# Patient Record
Sex: Male | Born: 1935 | Race: White | Hispanic: No | Marital: Married | Smoking: Former smoker
Health system: Southern US, Community
[De-identification: ages and names within clinical notes are randomized; demographics above are authoritative.]

## PROBLEM LIST (undated history)

## (undated) DIAGNOSIS — M549 Dorsalgia, unspecified: Secondary | ICD-10-CM

## (undated) DIAGNOSIS — I453 Trifascicular block: Secondary | ICD-10-CM

## (undated) DIAGNOSIS — R55 Syncope and collapse: Secondary | ICD-10-CM

## (undated) DIAGNOSIS — I10 Essential (primary) hypertension: Secondary | ICD-10-CM

## (undated) DIAGNOSIS — I493 Ventricular premature depolarization: Secondary | ICD-10-CM

## (undated) DIAGNOSIS — D472 Monoclonal gammopathy: Principal | ICD-10-CM

## (undated) DIAGNOSIS — G43909 Migraine, unspecified, not intractable, without status migrainosus: Secondary | ICD-10-CM

## (undated) DIAGNOSIS — K219 Gastro-esophageal reflux disease without esophagitis: Secondary | ICD-10-CM

## (undated) DIAGNOSIS — I4891 Unspecified atrial fibrillation: Secondary | ICD-10-CM

## (undated) DIAGNOSIS — R001 Bradycardia, unspecified: Secondary | ICD-10-CM

## (undated) DIAGNOSIS — I5022 Chronic systolic (congestive) heart failure: Secondary | ICD-10-CM

## (undated) DIAGNOSIS — J939 Pneumothorax, unspecified: Secondary | ICD-10-CM

## (undated) DIAGNOSIS — J449 Chronic obstructive pulmonary disease, unspecified: Secondary | ICD-10-CM

## (undated) DIAGNOSIS — I509 Heart failure, unspecified: Secondary | ICD-10-CM

## (undated) HISTORY — PX: TENDON REPAIR: SHX5111

## (undated) HISTORY — PX: SHOULDER ARTHROSCOPY: SHX128

## (undated) HISTORY — DX: Chronic systolic (congestive) heart failure: I50.22

## (undated) HISTORY — PX: CLAVICLE SURGERY: SHX598

## (undated) HISTORY — PX: PROSTATE SURGERY: SHX751

## (undated) HISTORY — DX: Ventricular premature depolarization: I49.3

## (undated) HISTORY — DX: Bradycardia, unspecified: R00.1

## (undated) HISTORY — DX: Chronic obstructive pulmonary disease, unspecified: J44.9

## (undated) HISTORY — DX: Monoclonal gammopathy: D47.2

## (undated) HISTORY — DX: Migraine, unspecified, not intractable, without status migrainosus: G43.909

---

## 1998-11-05 ENCOUNTER — Ambulatory Visit (HOSPITAL_BASED_OUTPATIENT_CLINIC_OR_DEPARTMENT_OTHER): Admission: RE | Admit: 1998-11-05 | Discharge: 1998-11-05 | Payer: Self-pay | Admitting: Otolaryngology

## 2000-09-26 ENCOUNTER — Ambulatory Visit (HOSPITAL_COMMUNITY): Admission: RE | Admit: 2000-09-26 | Discharge: 2000-09-26 | Payer: Self-pay | Admitting: Orthopedic Surgery

## 2000-09-27 ENCOUNTER — Emergency Department (HOSPITAL_COMMUNITY): Admission: EM | Admit: 2000-09-27 | Discharge: 2000-09-27 | Payer: Self-pay | Admitting: Emergency Medicine

## 2002-04-03 ENCOUNTER — Encounter: Admission: RE | Admit: 2002-04-03 | Discharge: 2002-04-03 | Payer: Self-pay | Admitting: Gastroenterology

## 2002-04-03 ENCOUNTER — Encounter: Payer: Self-pay | Admitting: Gastroenterology

## 2002-06-04 ENCOUNTER — Ambulatory Visit (HOSPITAL_COMMUNITY): Admission: RE | Admit: 2002-06-04 | Discharge: 2002-06-04 | Payer: Self-pay | Admitting: Gastroenterology

## 2002-06-04 ENCOUNTER — Encounter (INDEPENDENT_AMBULATORY_CARE_PROVIDER_SITE_OTHER): Payer: Self-pay | Admitting: Specialist

## 2002-06-12 ENCOUNTER — Ambulatory Visit (HOSPITAL_COMMUNITY): Admission: RE | Admit: 2002-06-12 | Discharge: 2002-06-12 | Payer: Self-pay | Admitting: Gastroenterology

## 2002-06-12 ENCOUNTER — Encounter: Payer: Self-pay | Admitting: Gastroenterology

## 2004-10-26 ENCOUNTER — Encounter: Admission: RE | Admit: 2004-10-26 | Discharge: 2004-10-26 | Payer: Self-pay | Admitting: Gastroenterology

## 2004-11-24 ENCOUNTER — Ambulatory Visit (HOSPITAL_COMMUNITY): Admission: RE | Admit: 2004-11-24 | Discharge: 2004-11-24 | Payer: Self-pay | Admitting: Gastroenterology

## 2004-11-24 ENCOUNTER — Encounter (INDEPENDENT_AMBULATORY_CARE_PROVIDER_SITE_OTHER): Payer: Self-pay | Admitting: *Deleted

## 2005-12-19 ENCOUNTER — Encounter: Payer: Self-pay | Admitting: Internal Medicine

## 2007-02-05 ENCOUNTER — Encounter: Admission: RE | Admit: 2007-02-05 | Discharge: 2007-02-05 | Payer: Self-pay | Admitting: Orthopedic Surgery

## 2007-02-07 ENCOUNTER — Ambulatory Visit (HOSPITAL_BASED_OUTPATIENT_CLINIC_OR_DEPARTMENT_OTHER): Admission: RE | Admit: 2007-02-07 | Discharge: 2007-02-07 | Payer: Self-pay | Admitting: Orthopedic Surgery

## 2009-03-22 ENCOUNTER — Ambulatory Visit: Payer: Self-pay | Admitting: Internal Medicine

## 2009-03-22 DIAGNOSIS — R0602 Shortness of breath: Secondary | ICD-10-CM

## 2009-04-29 ENCOUNTER — Ambulatory Visit: Payer: Self-pay | Admitting: Internal Medicine

## 2009-05-04 ENCOUNTER — Ambulatory Visit: Payer: Self-pay | Admitting: Internal Medicine

## 2009-05-04 DIAGNOSIS — J432 Centrilobular emphysema: Secondary | ICD-10-CM | POA: Insufficient documentation

## 2009-09-01 ENCOUNTER — Ambulatory Visit: Payer: Self-pay | Admitting: Internal Medicine

## 2009-09-01 ENCOUNTER — Encounter: Payer: Self-pay | Admitting: Adult Health

## 2009-09-01 DIAGNOSIS — I498 Other specified cardiac arrhythmias: Secondary | ICD-10-CM

## 2009-09-01 DIAGNOSIS — J069 Acute upper respiratory infection, unspecified: Secondary | ICD-10-CM | POA: Insufficient documentation

## 2009-09-03 ENCOUNTER — Ambulatory Visit (HOSPITAL_COMMUNITY): Admission: RE | Admit: 2009-09-03 | Discharge: 2009-09-03 | Payer: Self-pay | Admitting: Cardiology

## 2009-09-03 ENCOUNTER — Telehealth: Payer: Self-pay | Admitting: Adult Health

## 2009-09-20 ENCOUNTER — Ambulatory Visit: Payer: Self-pay | Admitting: Internal Medicine

## 2009-09-20 DIAGNOSIS — I5022 Chronic systolic (congestive) heart failure: Secondary | ICD-10-CM

## 2009-09-20 DIAGNOSIS — I493 Ventricular premature depolarization: Secondary | ICD-10-CM

## 2009-09-29 ENCOUNTER — Encounter: Payer: Self-pay | Admitting: Internal Medicine

## 2009-10-04 ENCOUNTER — Ambulatory Visit: Payer: Self-pay | Admitting: Internal Medicine

## 2009-10-04 ENCOUNTER — Encounter: Payer: Self-pay | Admitting: Internal Medicine

## 2009-10-29 ENCOUNTER — Ambulatory Visit: Payer: Self-pay | Admitting: Internal Medicine

## 2009-12-13 ENCOUNTER — Encounter: Payer: Self-pay | Admitting: Internal Medicine

## 2009-12-16 ENCOUNTER — Ambulatory Visit: Payer: Self-pay | Admitting: Internal Medicine

## 2010-02-18 ENCOUNTER — Ambulatory Visit (HOSPITAL_COMMUNITY): Admission: RE | Admit: 2010-02-18 | Discharge: 2010-02-18 | Payer: Self-pay | Admitting: Sports Medicine

## 2010-03-29 ENCOUNTER — Encounter: Payer: Self-pay | Admitting: Internal Medicine

## 2010-04-18 ENCOUNTER — Encounter (INDEPENDENT_AMBULATORY_CARE_PROVIDER_SITE_OTHER): Payer: Self-pay | Admitting: Orthopedic Surgery

## 2010-04-18 ENCOUNTER — Ambulatory Visit: Payer: Self-pay | Admitting: Surgery

## 2010-04-18 ENCOUNTER — Ambulatory Visit (HOSPITAL_COMMUNITY): Admission: RE | Admit: 2010-04-18 | Discharge: 2010-04-18 | Payer: Self-pay | Admitting: Orthopedic Surgery

## 2010-04-20 ENCOUNTER — Ambulatory Visit: Payer: Self-pay | Admitting: Internal Medicine

## 2010-04-20 ENCOUNTER — Encounter (INDEPENDENT_AMBULATORY_CARE_PROVIDER_SITE_OTHER): Payer: Self-pay | Admitting: *Deleted

## 2010-04-21 ENCOUNTER — Telehealth: Payer: Self-pay | Admitting: Nurse Practitioner

## 2010-04-22 ENCOUNTER — Telehealth: Payer: Self-pay | Admitting: Internal Medicine

## 2010-05-04 ENCOUNTER — Ambulatory Visit: Payer: Self-pay | Admitting: Internal Medicine

## 2010-05-04 LAB — CONVERTED CEMR LAB
BUN: 29 mg/dL — ABNORMAL HIGH (ref 6–23)
CO2: 30 meq/L (ref 19–32)
Chloride: 99 meq/L (ref 96–112)
Creatinine, Ser: 0.8 mg/dL (ref 0.4–1.5)
Eosinophils Absolute: 0.2 10*3/uL (ref 0.0–0.7)
Eosinophils Relative: 4.2 % (ref 0.0–5.0)
Glucose, Bld: 87 mg/dL (ref 70–99)
HCT: 40.6 % (ref 39.0–52.0)
Lymphs Abs: 1.3 10*3/uL (ref 0.7–4.0)
MCHC: 34.1 g/dL (ref 30.0–36.0)
MCV: 88.6 fL (ref 78.0–100.0)
Monocytes Absolute: 0.7 10*3/uL (ref 0.1–1.0)
Platelets: 256 10*3/uL (ref 150.0–400.0)
Potassium: 4.6 meq/L (ref 3.5–5.1)
Prothrombin Time: 10.3 s (ref 9.1–11.7)
RDW: 13.9 % (ref 11.5–14.6)
WBC: 5.6 10*3/uL (ref 4.5–10.5)

## 2010-05-05 ENCOUNTER — Telehealth: Payer: Self-pay | Admitting: Internal Medicine

## 2010-05-09 ENCOUNTER — Ambulatory Visit: Payer: Self-pay | Admitting: Internal Medicine

## 2010-05-09 ENCOUNTER — Ambulatory Visit (HOSPITAL_COMMUNITY): Admission: RE | Admit: 2010-05-09 | Discharge: 2010-05-10 | Payer: Self-pay | Admitting: Internal Medicine

## 2010-06-03 ENCOUNTER — Ambulatory Visit: Payer: Self-pay | Admitting: Internal Medicine

## 2010-08-15 ENCOUNTER — Encounter: Payer: Self-pay | Admitting: Internal Medicine

## 2011-01-03 NOTE — Letter (Signed)
Summary: [facility]   Imported By: Roderic Ovens 04/18/2010 14:43:24  _____________________________________________________________________  External Attachment:    Type:   Image     Comment:   External Document

## 2011-01-03 NOTE — Assessment & Plan Note (Signed)
Summary: 6wk f/u   Visit Type:  Follow-up Primary Provider:  Dr. Lanell Persons   History of Present Illness: Brian Hoover returns today for followup.  He is a pleasant 77 yo man with a h/o DCM, frequent PVC's and ETOH use.  He has sinus bradycardia.  With discontinuation of his ETOH intake, and uptitration of medical therapy, his CHF symptoms are improved and a recent 2-D echo demonstrated an increase in the LV function to 45%.  The patient still has minimally symptomatic PVC's.  He had 25K PVC's by 24 hour holter back in 09/2009.  This was prior to beta blocker therapy.  He has had no syncope.  He has been bothered by acute bronchitis and has not exercised.  He denies c/p or peripheral edema.  Current Medications (verified): 1)  Flomax 0.4 Mg Xr24h-Cap (Tamsulosin Hcl) .... Take 1 Capsule By Mouth Once A Day 2)  Avodart 0.5 Mg Caps (Dutasteride) .... Take 1 Capsule By Mouth Once A Day 3)  Bayer Low Strength 81 Mg Tbec (Aspirin) .... Take 1 Tablet By Mouth Once A Day 4)  Multivitamins   Tabs (Multiple Vitamin) .... Take 1 Tablet By Mouth Once A Day 5)  Trimethoprim 100 Mg Tabs (Trimethoprim) .... Once Daily 6)  Proair Hfa 108 (90 Base) Mcg/act Aers (Albuterol Sulfate) .... Inhale 2 Puffs Every Four Hours As Needed 7)  Carvedilol 6.25 Mg Tabs (Carvedilol) .... Take One Tablet By Mouth Twice A Day 8)  Lisinopril 20 Mg Tabs (Lisinopril) .... Take One Tablet By Mouth Daily  Allergies: 1)  Sulfamethoxazole  Past History:  Past Medical History: Last updated: 10/27/2009 Current Problems:  CHRONIC SYSTOLIC HEART FAILURE (ICD-428.22) PREMATURE VENTRICULAR CONTRACTIONS, FREQUENT (ICD-427.69) BRADYCARDIA (ICD-427.89) UPPER RESPIRATORY INFECTION, ACUTE (ICD-465.9) COPD (ICD-496) DYSPNEA (ICD-786.05)    Past Surgical History: Last updated: 03/22/2009 right foot, sore tendon repair right shoulder repair surgery for BPH  Review of Systems  The patient denies chest pain, syncope, dyspnea  on exertion, and peripheral edema.    Vital Signs:  Patient profile:   77 year old male Height:      70 inches Weight:      171 pounds BMI:     24.62 Pulse rate:   52 / minute BP sitting:   118 / 64  (left arm)  Vitals Entered By: Laurance Flatten CMA (December 16, 2009 2:23 PM)  Physical Exam  General:  Well developed, well nourished, in no acute distress. Head:  normocephalic and atraumatic Eyes:  PERRLA/EOM intact; conjunctiva and lids normal. Mouth:  Teeth, gums and palate normal. Oral mucosa normal. Neck:  Neck supple, no JVD. No masses, thyromegaly or abnormal cervical nodes. Lungs:  Clear bilaterally to auscultation.  No wheezes, rales, or rhonchi. Heart:  Regular brady with frequent premature beats.  No murmurs.  PMI is enlarged and laterally displaced. Abdomen:  Bowel sounds positive; abdomen soft and non-tender without masses, organomegaly, or hernias noted. No hepatosplenomegaly. Msk:  Back normal, normal gait. Muscle strength and tone normal. Pulses:  pulses normal in all 4 extremities Extremities:  No clubbing or cyanosis. Neurologic:  Alert and oriented x 3.   EKG  Procedure date:  12/16/2009  Findings:      Sinus bradycardia with rate of:  52.  Impression & Recommendations:  Problem # 1:  CHRONIC SYSTOLIC HEART FAILURE (ICD-428.22) His symptoms are current class 1-2.  I have asked him to continue his current meds and maintain a low sodium diet.  Also, I have notified him  that it is ok to start exercising again by walking. His updated medication list for this problem includes:    Bayer Low Strength 81 Mg Tbec (Aspirin) .Marland Kitchen... Take 1 tablet by mouth once a day    Carvedilol 6.25 Mg Tabs (Carvedilol) .Marland Kitchen... Take one tablet by mouth twice a day    Lisinopril 20 Mg Tabs (Lisinopril) .Marland Kitchen... Take one tablet by mouth daily  Problem # 2:  PREMATURE VENTRICULAR CONTRACTIONS, FREQUENT (ICD-427.69) His PVC's are not too symptomatic unless he feels for his pulse.  I will not  uptitrate his beta blocker further at this point deferring this to Dr. Anne Fu. His updated medication list for this problem includes:    Bayer Low Strength 81 Mg Tbec (Aspirin) .Marland Kitchen... Take 1 tablet by mouth once a day    Carvedilol 6.25 Mg Tabs (Carvedilol) .Marland Kitchen... Take one tablet by mouth twice a day    Lisinopril 20 Mg Tabs (Lisinopril) .Marland Kitchen... Take one tablet by mouth daily  Problem # 3:  BRADYCARDIA (ICD-427.89) At this point he is not particularly symptomatic and is tolerating his beta blocker.  Walking in my office today with pulse oximetry resulted in his heart rate getting up to 80/min from 55.  Continue current meds.  A period of watchful waiting is recommended. His updated medication list for this problem includes:    Bayer Low Strength 81 Mg Tbec (Aspirin) .Marland Kitchen... Take 1 tablet by mouth once a day    Carvedilol 6.25 Mg Tabs (Carvedilol) .Marland Kitchen... Take one tablet by mouth twice a day    Lisinopril 20 Mg Tabs (Lisinopril) .Marland Kitchen... Take one tablet by mouth daily  Patient Instructions: 1)  Your physician recommends that you schedule a follow-up appointment in: 3-4 months with Dr Ladona Ridgel

## 2011-01-03 NOTE — Progress Notes (Signed)
Summary: do not call back  Phone Note Call from Patient Call back at Home Phone [phone]   Caller: Patient Reason for Call: Talk to Nurse Summary of Call: wife calling stating that Dr Tenny Craw does not need to call again, her husband is fine Initial call taken by: Migdalia Dk,  Apr 22, 2010 3:29 PM  Follow-up for Phone Call        Dr.Ross aware. Follow-up by: Suzan Garibaldi RN

## 2011-01-03 NOTE — Letter (Signed)
Summary: ELectrophysiology/Ablation Procedure Instructions  [facility]     [place], [place]    Phone:   Fax:      Electrophysiology/Ablation Procedure Instructions    You are scheduled for a(n) _svt Ablation on  05/09/10 at  7:30 am with Dr.  Ladona Ridgel.  1.  Please come to the Short Stay Center at [facility] at  5:30 AM on the day of your procedure.  2.  Come prepared to stay overnight.   Please bring your insurance cards and a list of your medications.  3.  Come to the [place] office on  05/04/10 for lab work.  The lab at [facility] is open from 8:30 AM to 1:30 PM and 2:30 PM to 5:00 PM.  The lab at [facility] is open from 7:30 AM to 5:30 PM.  You do not have to be fasting.  4.  Do not have anything to eat or drink after midnight the night before your procedure.  5.  Do NOT take these medications for  Carvedilol  the night prior  procedure days before your procedure unless otherwise instructed:  ___________________.  All of your remaining medications may be taken with a small amount of water.  6.  Educational material received:  _____ EP   _____ Ablation   * Occasionally, EP studies and ablations can become lengthy.  Please make your family aware of this before your procedure starts.  Average time ranges from 2-8 hours for EP studies/ablations.  Your physician will locate your family after the procedure with the results.  * If you have any questions after you get home, please call the office at [phone].

## 2011-01-03 NOTE — Progress Notes (Signed)
Summary: Question about going out town after cath  Phone Note Call from Patient Call back at [facility] Phone [phone]   Caller: Patient Summary of Call: Pt have question about going out town after cath on Monday Initial call taken by: Judie Grieve,  May 05, 2010 9:31 AM  Follow-up for Phone Call        wants to go sailing on the 9th onthe boat for 6 days and then go to visit daughter in [place] and get on a friends boat for 5 days.  Will be gone for 3 weeks.  Wants to know if this is ok. He has help on the boat. Dennis Bast, RN, BSN  May 05, 2010 6:27 PM Discussed with Dr Ladona Ridgel no heavy lifting for 5 days after procedure.  Pt aware and states "he is good at giving orders" He will proceed with plans Dennis Bast, RN, BSN  May 06, 2010 10:08 AM

## 2011-01-03 NOTE — Progress Notes (Signed)
Summary: Cardiology Phone Note - Dizziness  Phone Note Call from Patient   Caller: Spouse Summary of Call: received call from pts. spouse stating that earlier today he had sudden dizziness with sensation of the room spinning followed by nausea.  pt then got on the phone.  he checked his hr and bp and reports that both are stable (hr registered in mid 40's which is common for him but he has a h/o freq. pvc's that do not register on his device).  he denies c/p or sob.  he has not vomited but has had dry heaves.  ss have improved some over the past few hours but he remains somewhat dizzy and nauseated.  i advised that this is not clearly cardiac in origin but that if he is feeling unstable that he should present to the ER for eval.  OTW, he should lie down and hydrate as Ss may be consistent with a GI viral illness vs. inner ear issue.  He says that he doesn't feel that bad and is going to try and wait it out.  He was thankful for the call back. Initial call taken by: Creig Hines, ANP-BC,  Apr 21, 2010 7:37 PM

## 2011-01-03 NOTE — Assessment & Plan Note (Signed)
Summary: Brian Hoover   Visit Type:  Follow-up Primary Provider:  Dr. Lanell Persons   History of Present Illness: Mr. Elena returns today for followup.  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  He has done well since his ablation.  His dyspnea is improved.  He has very rare palpitations, much better than prior to his ablation.  No peripheral edema or syncope or dyspnea.  Current Medications (verified): 1)  Flomax 0.4 Mg Xr24h-Cap (Tamsulosin Hcl) .... Take 1 Capsule By Mouth Once A Day 2)  Avodart 0.5 Mg Caps (Dutasteride) .... Take 1 Capsule By Mouth Once A Day 3)  Bayer Low Strength 81 Mg Tbec (Aspirin) .... Take 1 Tablet By Mouth Once A Day 4)  Multivitamins   Tabs (Multiple Vitamin) .... Take 1 Tablet By Mouth Once A Day 5)  Trimethoprim 100 Mg Tabs (Trimethoprim) .... Once Daily 6)  Proair Hfa 108 (90 Base) Mcg/act Aers (Albuterol Sulfate) .... Inhale 2 Puffs Every Four Hours As Needed 7)  Carvedilol 6.25 Mg Tabs (Carvedilol) .... Take One Tablet By Mouth Twice A Day 8)  Lisinopril 20 Mg Tabs (Lisinopril) .... Take One Tablet By Mouth Daily 9)  Zyprexa 10 Mg Tabs (Olanzapine) .... As Needed  Allergies: 1)  Sulfamethoxazole  Past History:  Past Medical History: Last updated: 10/27/2009 Current Problems:  CHRONIC SYSTOLIC HEART FAILURE (ICD-428.22) PREMATURE VENTRICULAR CONTRACTIONS, FREQUENT (ICD-427.69) BRADYCARDIA (ICD-427.89) UPPER RESPIRATORY INFECTION, ACUTE (ICD-465.9) COPD (ICD-496) DYSPNEA (ICD-786.05)    Past Surgical History: Last updated: 03/22/2009 right foot, sore tendon repair right shoulder repair surgery for BPH  Review of Systems  The patient denies chest pain, syncope, dyspnea on exertion, and peripheral edema.    Vital Signs:  Patient profile:   77 year old male Height:      70 inches Weight:      177 pounds BMI:     25.49 Pulse rate:   44 / minute BP sitting:   98 / 50  (left arm)  Vitals Entered  By: Laurance Flatten CMA (June 03, 2010 10:10 AM)  Physical Exam  General:  Well developed, well nourished, in no acute distress. Head:  normocephalic and atraumatic Eyes:  PERRLA/EOM intact; conjunctiva and lids normal. Mouth:  Teeth, gums and palate normal. Oral mucosa normal. Neck:  Neck supple, no JVD. No masses, thyromegaly or abnormal cervical nodes. Lungs:  Clear bilaterally to auscultation.  No wheezes, rales, or rhonchi. Heart:  Regular brady.  No murmurs.  PMI is enlarged and laterally displaced. Abdomen:  Bowel sounds positive; abdomen soft and non-tender without masses, organomegaly, or hernias noted. No hepatosplenomegaly. Msk:  Back normal, normal gait. Muscle strength and tone normal. Pulses:  pulses normal in all 4 extremities Extremities:  No clubbing or cyanosis. Right lower leg has some residual swelling around his achilles. Neurologic:  Alert and oriented x 3.   EKG  Procedure date:  06/03/2010  Findings:      Sinus bradycardia with rate of:  44.  Impression & Recommendations:  Problem # 1:  CHRONIC SYSTOLIC HEART FAILURE (ICD-428.22) His symptoms are much improved since his ablation.  I will ask Dr. Anne Fu to repeat his 2D echo in 2-3 months.  I think that his coreg could be stopped if the EF has normalized. His updated medication list for this problem includes:    Bayer Low Strength 81 Mg Tbec (Aspirin) .Marland Kitchen... Take 1 tablet by mouth once a day  Carvedilol 6.25 Mg Tabs (Carvedilol) .Marland Kitchen... Take one tablet by mouth twice a day    Lisinopril 20 Mg Tabs (Lisinopril) .Marland Kitchen... Take one tablet by mouth daily  Problem # 2:  PREMATURE VENTRICULAR CONTRACTIONS, FREQUENT (ICD-427.69) His symptoms are much improved.  Will followup as needed. His updated medication list for this problem includes:    Bayer Low Strength 81 Mg Tbec (Aspirin) .Marland Kitchen... Take 1 tablet by mouth once a day    Carvedilol 6.25 Mg Tabs (Carvedilol) .Marland Kitchen... Take one tablet by mouth twice a day    Lisinopril 20  Mg Tabs (Lisinopril) .Marland Kitchen... Take one tablet by mouth daily  Problem # 3:  BRADYCARDIA (ICD-427.89) His bradycardia appears to be asymptomatic.  At this time he does not need a PPM.  He may someday.  If his EF improves after ablation, then I would stop his beta blocker. His updated medication list for this problem includes:    Bayer Low Strength 81 Mg Tbec (Aspirin) .Marland Kitchen... Take 1 tablet by mouth once a day    Carvedilol 6.25 Mg Tabs (Carvedilol) .Marland Kitchen... Take one tablet by mouth twice a day    Lisinopril 20 Mg Tabs (Lisinopril) .Marland Kitchen... Take one tablet by mouth daily

## 2011-01-03 NOTE — Assessment & Plan Note (Signed)
Summary: per check out/sf   Visit Type:  Follow-up Primary Provider:  Dr. Lanell Persons   History of Present Illness: Brian Hoover returns today for followup.  He is a pleasant 77 yo man with a h/o DCM, frequent PVC's and ETOH use.  He has sinus bradycardia.  With discontinuation of his ETOH intake, and uptitration of medical therapy, his CHF symptoms are improved and a recent 2-D echo demonstrated an increase in the LV function to 45%.  The patient still has minimally symptomatic PVC's.  He had 25K PVC's by 24 hour holter back in 09/2009.  This was prior to beta blocker therapy.  He has had no syncope.  He was skiing in [place] when he crashed, fracturing his collar bone and tearing his achilles tendon partially.   He denies c/p or peripheral edema.  He has not had syncope.  No other complaints.  At this point he and I are both concerned about his PVC's which are now in a bigeminal distribution and appear to be symptomatic in that they have reduced his cardiac output.  Current Medications (verified): 1)  Flomax 0.4 Mg Xr24h-Cap (Tamsulosin Hcl) .... Take 1 Capsule By Mouth Once A Day 2)  Avodart 0.5 Mg Caps (Dutasteride) .... Take 1 Capsule By Mouth Once A Day 3)  Bayer Low Strength 81 Mg Tbec (Aspirin) .... Take 1 Tablet By Mouth Once A Day 4)  Multivitamins   Tabs (Multiple Vitamin) .... Take 1 Tablet By Mouth Once A Day 5)  Trimethoprim 100 Mg Tabs (Trimethoprim) .... Once Daily 6)  Proair Hfa 108 (90 Base) Mcg/act Aers (Albuterol Sulfate) .... Inhale 2 Puffs Every Four Hours As Needed 7)  Carvedilol 6.25 Mg Tabs (Carvedilol) .... Take One Tablet By Mouth Twice A Day 8)  Lisinopril 20 Mg Tabs (Lisinopril) .... Take One Tablet By Mouth Daily 9)  Zyprexa 10 Mg Tabs (Olanzapine) .... As Needed  Allergies (verified): 1)  Sulfamethoxazole  Past History:  Past Medical History: Last updated: 10/27/2009 Current Problems:  CHRONIC SYSTOLIC HEART FAILURE (ICD-428.22) PREMATURE VENTRICULAR  CONTRACTIONS, FREQUENT (ICD-427.69) BRADYCARDIA (ICD-427.89) UPPER RESPIRATORY INFECTION, ACUTE (ICD-465.9) COPD (ICD-496) DYSPNEA (ICD-786.05)    Past Surgical History: Last updated: 03/22/2009 right foot, sore tendon repair right shoulder repair surgery for BPH  Review of Systems  The patient denies chest pain, syncope, dyspnea on exertion, and peripheral edema.    Vital Signs:  Patient profile:   77 year old male Height:      70 inches Weight:      175 pounds BMI:     25.20 Pulse rate:   57 / minute BP sitting:   98 / 72  (left arm)  Vitals Entered By: Laurance Flatten CMA (Apr 20, 2010 8:52 AM)  Physical Exam  General:  Well developed, well nourished, in no acute distress. Head:  normocephalic and atraumatic Eyes:  PERRLA/EOM intact; conjunctiva and lids normal. Mouth:  Teeth, gums and palate normal. Oral mucosa normal. Neck:  Neck supple, no JVD. No masses, thyromegaly or abnormal cervical nodes. Lungs:  Clear bilaterally to auscultation.  No wheezes, rales, or rhonchi. Heart:  Regular brady with frequent premature beats.  No murmurs.  PMI is enlarged and laterally displaced. Abdomen:  Bowel sounds positive; abdomen soft and non-tender without masses, organomegaly, or hernias noted. No hepatosplenomegaly. Msk:  Back normal, normal gait. Muscle strength and tone normal. Pulses:  pulses normal in all 4 extremities Extremities:  No clubbing or cyanosis. Right lower leg has some residual swelling around  his achilles. Neurologic:  Alert and oriented x 3.   EKG  Procedure date:  04/20/2010  Findings:      Sinus bradycardia with rate of: 56. PVC's noted in a bigeminal distribution.    Impression & Recommendations:  Problem # 1:  PREMATURE VENTRICULAR CONTRACTIONS, FREQUENT (ICD-427.69) If anything, his PVC's appear to be increased in frequency, severity and that they appear to be monomorphic.  I have discussed the treatment options with the patient and his wife and they  wish to proceed with ablation of his PVC's and NSVT. His updated medication list for this problem includes:    Bayer Low Strength 81 Mg Tbec (Aspirin) .Marland Kitchen... Take 1 tablet by mouth once a day    Carvedilol 6.25 Mg Tabs (Carvedilol) .Marland Kitchen... Take one tablet by mouth twice a day    Lisinopril 20 Mg Tabs (Lisinopril) .Marland Kitchen... Take one tablet by mouth daily  Orders: EKG w/ Interpretation (93000)  Problem # 2:  CHRONIC SYSTOLIC HEART FAILURE (ICD-428.22) Previously his symptoms were stable and his EF had improved.  Will plan to repeat his echo after his ablation.   His updated medication list for this problem includes:    Bayer Low Strength 81 Mg Tbec (Aspirin) .Marland Kitchen... Take 1 tablet by mouth once a day    Carvedilol 6.25 Mg Tabs (Carvedilol) .Marland Kitchen... Take one tablet by mouth twice a day    Lisinopril 20 Mg Tabs (Lisinopril) .Marland Kitchen... Take one tablet by mouth daily  Orders: EKG w/ Interpretation (93000)  Problem # 3:  BRADYCARDIA (ICD-427.89) I have cautioned him that his bradycardia will not improve with ablation and he may yet require PPM or DDD ICD.  Will followup. His updated medication list for this problem includes:    Bayer Low Strength 81 Mg Tbec (Aspirin) .Marland Kitchen... Take 1 tablet by mouth once a day    Carvedilol 6.25 Mg Tabs (Carvedilol) .Marland Kitchen... Take one tablet by mouth twice a day    Lisinopril 20 Mg Tabs (Lisinopril) .Marland Kitchen... Take one tablet by mouth daily  Patient Instructions: 1)  Your physician has recommended that you have an ablation.  Catheter ablation is a medical procedure used to treat some cardiac arrhythmias (irregular heartbeats). During catheter ablation, a long, thin, flexible tube is put into a blood vessel in your groin (upper thigh), or neck. This tube is called an ablation catheter. It is then guided to your heart through the blood vessel. Radiofrequency waves destroy small areas of heart tissue where abnormal heartbeats may cause an arrhythmia to start.  Please see the instruction sheet  given to you today. 2)  Your physician recommends that you return for lab work [id number] 1st/ 11

## 2011-01-03 NOTE — Letter (Signed)
Summary: [facility] - Office Visit  [facility] - Office Visit   Imported By: Marylou Mccoy 09/12/2010 07:27:52  _____________________________________________________________________  External Attachment:    Type:   Image     Comment:   External Document

## 2011-02-23 ENCOUNTER — Ambulatory Visit (HOSPITAL_BASED_OUTPATIENT_CLINIC_OR_DEPARTMENT_OTHER): Admission: RE | Admit: 2011-02-23 | Payer: Medicare Other | Source: Ambulatory Visit | Admitting: Orthopedic Surgery

## 2011-04-21 NOTE — Procedures (Signed)
[facility]  Patient:    Brian Hoover, Brian Hoover Visit Number: [id number] MRN: [id number]          Service Type: END Location: ENDO Attending Physician:  Dennison Bulla Ii Dictated by:   Verlin Grills, M.D. Proc. Date: 06/04/02 Admit Date:  06/04/2002 Discharge Date: 06/04/2002   CC:         Gita Kudo, M.D.  Al Decant. Janey Greaser, M.D.   Procedure Report  PROCEDURE:  Colonoscopy with polypectomy.  INDICATION FOR PROCEDURE:  The patient is a 77 year old male born February 14, 1932. The patient has unexplained right paraumbilical abdominal pain which waxes and wanes in intensity. The pain is not musculoskeletal in origin by history or physical examination. There is no abnormality by abdominal examination. On February 04, 1998, flexible proctosigmoidoscopy followed by air contrast barium enema revealed large internal hemorrhoids and mild narrowing in the sigmoid colon which was felt to be secondary to intestinal spasm. The patient has an enlarged prostate and is on therapy for gastroesophageal reflux. In 1993, he underwent an inguinal herniorrhaphy. On Apr 03, 2002, he underwent a CT scan of the abdomen and pelvis which revealed no significant abnormalities in the upper abdomen; marked enlargement of the prostate, particularly the median lobe; sigmoid diverticulosis.  ENDOSCOPIST:  Verlin Grills, M.D.  PREMEDICATION:  Versed 5 mg, Demerol 50 mg.  ENDOSCOPE:  Olympus pediatric colonoscope.  DESCRIPTION OF PROCEDURE:  After obtaining informed consent, the patient was placed in the left lateral decubitus position. I administered intravenous Demerol and intravenous Versed to achieve conscious sedation for the procedure. The patients cardiac rhythm, oxygen saturation and blood pressure were monitored throughout the procedure and documented in the medical record.  Anal inspection was normal. Digital rectal exam revealed a  non-nodular prostate. The Olympus pediatric video colonoscope was introduced into the rectum and easily advanced to the cecum.  A normal appearing ileocecal valve was intubated and the distal ileum inspected. Colonic preparation for the exam today was excellent.  RECTUM:  From the distal rectum, a 1 mm sessile polyp was removed with the electrocautery snare and submitted for pathological interpretation.  SIGMOID COLON/DESCENDING COLON:  Left colonic diverticulosis.  SPLENIC FLEXURE:  Normal.  TRANSVERSE COLON:  Normal.  HEPATIC FLEXURE:  Normal.  ASCENDING COLON:  Normal.  CECUM/ILEOCECAL VALVE:  From the proximal cecum and the area of the appendiceal orifice, a 4 mm sessile polyp was discovered; the polyp was biopsied multiple times; the sessile polyp was lifted with saline and removed with the electrocautery snare. The snare polypectomy specimen was not retrieved.  DISTAL ILEUM:  Normal.  ASSESSMENT: 1. Left colonic diverticulosis. 2. Enlarged prostate. 3. A 1 mm sessile polyp was removed from the distal rectum. 4. A 4 mm sessile polyp was removed from the proximal cecum in the area of    the appendiceal orifice.  RECOMMENDATIONS:  I have no explanation for Mr. Ruhland right paraumbilical abdominal pain based on todays colonoscopy. I will schedule him for a small bowel follow-through x-ray series to look for any abnormality in the ileum. I suspect this x-ray will be normal. We have discussed diagnostic laparoscopy as a final exam if all the noninvasive tests are normal and Mr. Merryfield and Dr. Maryagnes Amos felt that the exam would be worthwhile. Dictated by:   Verlin Grills, M.D. Attending Physician:  Dennison Bulla Ii DD:  06/04/02 TD:  06/07/02 Job: [id number]

## 2011-04-21 NOTE — Op Note (Signed)
[facility]  Patient:    Brian Hoover, Brian Hoover                         MRN: [id number] Proc. Date: 09/26/00 Attending:  Fayrene Fearing P. Aplington, M.D.                           Operative Report  PREOPERATIVE DIAGNOSES: 1. Chronic impingement syndrome with partial rotator cuff tear. 2. Labral degeneration, right shoulder.  POSTOPERATIVE DIAGNOSES: 1. Chronic impingement syndrome with partial rotator cuff tear. 2. Labral degeneration, right shoulder.  OPERATION PERFORMED: 1. Right shoulder arthroscopy (essentially normal exam). 2. Arthroscopic subacromial decompression with shaving of rotator cuff.  SURGEON:  Dr. Simonne Come.  ANESTHESIA:  General.  PATHOLOGY AND JUSTIFICATION FOR PROCEDURE:  He has had a long history of problems with the right shoulder. Roughly 10 years ago, he had frozen shoulder, more recent a bit of pain and popping and difficulty with overhead activities. An MRI has demonstrated some apparent labral degeneration and what appeared to be a partial rotator cuff tear near its insertion. Plain x-rays reveals a large subacromial spur. He is here today for the above mentioned surgery because of the pathology.  DESCRIPTION OF PROCEDURE:  Satisfactory general anesthesia, beach chair position on the Schlein frame, the right shoulder girdle was prepped with duraprep and draped in a sterile field and anatomy of the shoulder was marked out and a posterior soft spot portal, lateral portal and subacromial bursa were all infiltrated with 0.5% Marcaine and adrenaline. Through the posterior soft spot portal, I was able to atraumatically enter the shoulder joint and had excellent visualization. The humeral head looked normal. The long head of the biceps tendon was normal. There did not appear to be any tear of the underneath surface of the rotator cuff. There was a very minimal area of fraying of the labrum but there did not appear to be anything that  would benefit from shaving and consequently no anterior portal or shaving procedure or debridement procedure was performed today. After taking pictures to document all of this, I then redirected the scope and the subacromial bursa, entered the lateral portal and placed a 4.2 shaver. He had a large amount of bursal material and scar and fibrosis which I gradually resected with a 4.2 shaver shaving the underneath surface of the acromion. I then enlarged the lateral portal and through a larger cannula placed the underwater Bovie and used the cutting cautery around the perimeter of the acromion cutting the coracoacromial ligament which is quite thick and also coagulating some minor bleeding. I then used the 4.2 shaver once again to remove more soft tissue, introduced the 4.5 bur and began shaving down the underneath surface of the acromion back to the [facility] joint and then alternating back and forth between the bur, the shaver and occasionally the cautery until all bone that appeared to be creating an impingement problem was removed. Prior to the shaving, I took an initial picture. He had a very tight subacromial arch; however, had difficulty even placing the shaver between the rotator cuff and the subacromial area. After the decompression, I could easily have placed 2 shavers with plenty of room present with the arm abducted to about 75 degrees. When I was satisfied that we had thoroughly decompressed the subacromial space, I irrigated the joint and removed the scope and cannula.  The 2 portals were closed with inter 4-0 nylon and  once again infiltrated 0.5% Marcaine with adrenaline as was the subacromial bursa. Betadine Adaptic dry sterile dressing and shoulder immobilizer were applied. The patient tolerated the procedure well and was taken to the recovery room in satisfactory condition with no known complications. DD:  09/26/00 TD:  09/26/00 Job: [id number]

## 2011-04-21 NOTE — Op Note (Signed)
Brian Hoover, Brian Hoover                ACCOUNT NO.:  [id number]   MEDICAL RECORD NO.:  [id number]          PATIENT TYPE:  AMB   LOCATION:  ENDO                         FACILITY:  [facility]   PHYSICIAN:  Danise Edge, M.D.   DATE OF BIRTH:  22-Jun-1932   DATE OF PROCEDURE:  11/24/2004  DATE OF DISCHARGE:                                 OPERATIVE REPORT   PROCEDURE:  Esophagogastroduodenoscopy, small bowel biopsy and colonoscopy.   INDICATIONS FOR PROCEDURE:  Mr. Jacqueline Delapena is a 77 year old male born  Sep 19, 1932.  Mr. Chavarin has a chronic right lower quadrant abdominal  discomfort which has defied explanation despite a rather complete evaluation  in 2003 which included colonoscopy, CT scan of the abdomen and pelvis and  upper GI small bowel followthrough x-ray series.  Mr. Santerre has now  developed iron deficiency anemia based on a low serum ferritin and mildly  low hemoglobin 13.3 g.  A recent CT scan of the abdomen and pelvis revealed  a small right inguinal hernia which is probably not the cause of his pain  based on an evaluation by Dr. Jerelene Redden.   Mr. Gorniak is undergoing upper and lower gastrointestinal endoscopy primarily  to evaluate his unexplained iron deficiency.   ENDOSCOPIST:  Danise Edge, M.D.   PREMEDICATION:  Versed 6 mg, Demerol 60 mg.   PROCEDURE:  Esophagogastroduodenoscopy with small bowel biopsies.   DESCRIPTION OF PROCEDURE:  After obtaining informed consent, Mr. Keefe was  placed in the left lateral decubitus position. I administered intravenous  Demerol and intravenous Versed to achieve conscious sedation for the  procedure. The patient's blood pressure, oxygen saturation and cardiac  rhythm were monitored throughout the procedure and documented in the medical  record.   The Olympus gastroscope was passed through the posterior hypopharynx into  the proximal esophagus without difficulty. The hypopharynx, larynx and vocal  cords appeared normal.   ESOPHAGOSCOPY:  The proximal, mid and lower segments of the esophageal  appear completely normal.  Despite his history of chronic gastroesophageal  reflux, Mr. Buffa has no endoscopic signs for the presence of Barrett's  esophagus, erosive esophagitis, esophageal mucosal scarring or esophageal  stricture formation.   GASTROSCOPY:  Retroflexed view of the gastric cardia and fundus was normal.  The diaphragmatic hiatus is slightly patulous.  The gastric body, antrum and  pylorus appear normal.   DUODENOSCOPY:  The duodenal bulb, second portion of duodenum and third  portion of duodenum appeared normal.  Four biopsies were taken from the  second-third portions of the duodenum to rule out signs of celiac disease.   ASSESSMENT:  Normal esophageal gastroduodenoscopy.  Small bowel biopsies,  rule out celiac disease pending.   PROCEDURE:  Proctocolonoscopy to the cecum.   DESCRIPTION OF PROCEDURE:  Anal inspection and digital rectal exam were  normal except for the presence of an enlarged non-nodular prostate. The  Olympus adjustable pediatric colonoscope was introduced into the rectum and  advanced to the cecum. A prominent but otherwise normal appearing ileocecal  valve was intubated and the distal ileum inspected.  Colonic preparation for  the exam today was excellent.   RECTUM:  Two diminutive polyps were removed with the cold biopsy forceps  from the proximal rectum.   SIGMOID COLON AND DESCENDING COLON:  Left colonic diverticulosis.   SPLENIC FLEXURE:  Normal.   TRANSVERSE COLON:  Normal.   HEPATIC FLEXURE:  Normal.   ASCENDING COLON:  Normal.   CECUM AND ILEOCECAL VALVE:  Normal. I did obtain some biopsies from the lip  of the ileocecal valve but doubt these biopsies will return neoplastic  tissue. I got a good look at his appendiceal orifice which appears normal.   DISTAL ILEUM:  Normal.   ASSESSMENT:  1.  Two diminutive polyps were removed from the proximal rectum.  2.   Extensive left colonic diverticulosis.  3.  Prominent normal appearing ileocecal valve which was biopsied and distal      ileum.      MJ/MEDQ  D:  11/24/2004  T:  11/24/2004  Job:  [id number]   cc:   Al Decant. Janey Greaser, MD  [address]  [place]  [place] [address]  Fax: [phone]   Gita Kudo, M.D.  [address], [address]  [place]  [place] [address]  Fax: [phone]

## 2011-04-21 NOTE — Op Note (Signed)
Brian Hoover, Brian Hoover                ACCOUNT NO.:  [id number]   MEDICAL RECORD NO.:  [id number]          PATIENT TYPE:  AMB   LOCATION:  DSC                          FACILITY:  [facility]   PHYSICIAN:  Loreta Ave, M.D. DATE OF BIRTH:  [birth year]   DATE OF PROCEDURE:  02/07/2007  DATE OF DISCHARGE:                               OPERATIVE REPORT   PREOPERATIVE DIAGNOSES:  Subacromial impingement, right shoulder; distal  clavicle osteolysis; abrasive partial tear of rotator cuff; superior  labrum tear.   POSTOPERATIVE DIAGNOSES:  Subacromial impingement, right shoulder;  distal clavicle osteolysis; abrasive partial tear of rotator cuff;  superior labrum tear; without full-thickness tear of the cuff.   PROCEDURES:  Right shoulder exam under anesthesia, arthroscopy,  debridement of labrum and rotator cuff, bursectomy, acromioplasty,  coracoacromial ligament release, excision of distal clavicle.   SURGEON:  Loreta Ave, M.D.   ASSISTANT:  Genene Churn. Denton Meek.   ANESTHESIA:  General.   BLOOD LOSS:  Minimal.   SPECIMENS:  None.   CULTURES:  None.   COMPLICATIONS:  None.   DRESSING:  Sterile compressive with sling.   DESCRIPTION OF PROCEDURES:  The patient was brought to the operating  room, and after adequate anesthesia had been obtained, the right  shoulder was examined.  Full motion and good stability.  Placed in a  beach-chair position on the shoulder positioner and prepped and draped  in the usual sterile fashion.  Three standard portals, anterior,  posterior and lateral.  Shoulder entered with a blunt obturator.  Arthroscope introduced, distended, inspected.  Articular cartilage  looked fairly good with minimal degenerative change.  Complex tear,  superior posterior labrum, debrided back to healthy tissue.  Undersurface of rotator cuff, biceps tendon and biceps anchor intact.  Cannula redirected subacromially.  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  Bursa resected.  A little abrasive change on top of the  cuff debrided.  No significant structural tears.  Acromioplasty to a  type 1 acromion with a shaver and a high-speed bur, releasing the CA  ligament with cautery.  Distal clavicle exposed and peri-articular spurs  and lateral centimeter of clavicle resected.  Excellent decompression.  Clavicle excision confirmed viewing from all portals.  Instruments and  fluid  removed.  Portals, shoulder and bursa injected with Marcaine.  Portals  were closed with 4-0 nylon.  Sterile compressive dressing applied.  Sling applied.  Anesthesia reversed.  Brought to recovery room.  He  tolerated surgery well, with no complications.      Loreta Ave, M.D.  Electronically Signed     DFM/MEDQ  D:  02/07/2007  T:  02/07/2007  Job:  [id number]

## 2011-08-17 ENCOUNTER — Other Ambulatory Visit: Payer: Self-pay | Admitting: Dermatology

## 2012-01-15 ENCOUNTER — Encounter: Payer: Self-pay | Admitting: Internal Medicine

## 2012-01-16 ENCOUNTER — Other Ambulatory Visit (INDEPENDENT_AMBULATORY_CARE_PROVIDER_SITE_OTHER): Payer: Medicare Other

## 2012-01-16 ENCOUNTER — Ambulatory Visit (INDEPENDENT_AMBULATORY_CARE_PROVIDER_SITE_OTHER): Payer: Medicare Other | Admitting: Internal Medicine

## 2012-01-16 ENCOUNTER — Encounter: Payer: Self-pay | Admitting: Internal Medicine

## 2012-01-16 VITALS — BP 142/84 | HR 63 | Ht 70.0 in | Wt 178.2 lb

## 2012-01-16 DIAGNOSIS — L299 Pruritus, unspecified: Secondary | ICD-10-CM

## 2012-01-16 LAB — CBC WITH DIFFERENTIAL/PLATELET
Basophils Absolute: 0 10*3/uL (ref 0.0–0.1)
Eosinophils Absolute: 0.1 10*3/uL (ref 0.0–0.7)
Hemoglobin: 15.4 g/dL (ref 13.0–17.0)
Lymphocytes Relative: 16 % (ref 12.0–46.0)
Lymphs Abs: 0.9 10*3/uL (ref 0.7–4.0)
MCHC: 33.3 g/dL (ref 30.0–36.0)
Monocytes Absolute: 0.6 10*3/uL (ref 0.1–1.0)
Neutro Abs: 3.7 10*3/uL (ref 1.4–7.7)
RDW: 15.7 % — ABNORMAL HIGH (ref 11.5–14.6)

## 2012-01-16 MED ORDER — MONTELUKAST SODIUM 10 MG PO TABS: 10.0000 mg | ORAL_TABLET | Freq: Every day | ORAL | Status: DC

## 2012-01-16 MED ORDER — METHYLPREDNISOLONE ACETATE 80 MG/ML IJ SUSP
80.0000 mg | Freq: Once | INTRAMUSCULAR | Status: AC
  Administered 2012-01-16: 80 mg via INTRAMUSCULAR
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
## 2012-01-16 NOTE — Patient Instructions (Addendum)
Order lab-   C-reactive protein, sed rate, ANA, Allergy profile, CBC w/ diff     Dx pruritus  Try Singulair  And Pepcid/ famotidine (otc) and  And Zyrtec/ cetirizine (otc)   Script for Singulair/ montelukast was sent  I usually try to stop lisinopril for a month, before concluding it is not involved with a problem.  Depomedrol 80 mg IM

## 2012-01-17 LAB — ALLERGY FULL PROFILE
Allergen, D pternoyssinus,d7: <0.1 kU/L (ref <0.35)
Alternaria Alternata: <0.1 kU/L (ref <0.35)
Bahia Grass: <0.1 kU/L (ref <0.35)
Box Elder IgE: <0.1 kU/L (ref <0.35)
Cat Dander: <0.1 kU/L (ref <0.35)
Common Ragweed: <0.1 kU/L (ref <0.35)
D. farinae: <0.1 kU/L (ref <0.35)
Dog Dander: <0.1 kU/L (ref <0.35)
G005 Rye, Perennial: <0.1 kU/L (ref <0.35)
Goldenrod: <0.1 kU/L (ref <0.35)
Helminthosporium halodes: <0.1 kU/L (ref <0.35)
House Dust Hollister: <0.1 kU/L (ref <0.35)
Sycamore Tree: <0.1 kU/L (ref <0.35)

## 2012-01-17 LAB — ANA: Anti Nuclear Antibody(ANA): NEGATIVE

## 2012-01-18 ENCOUNTER — Telehealth: Payer: Self-pay | Admitting: Internal Medicine

## 2012-01-18 NOTE — Telephone Encounter (Signed)
I spoke with pt and he is requesting his lab results. Also he wants his last OV note and alb results sent over to his pcp Dr. Larwance Sachs. Please advise Dr. Maple Hudson thanks

## 2012-01-20 DIAGNOSIS — L299 Pruritus, unspecified: Secondary | ICD-10-CM | POA: Insufficient documentation

## 2012-01-20 NOTE — Assessment & Plan Note (Signed)
Not clear if his pruritus, feeling cold and warm throat, and nervousness are all part of the same condition. There is no visible rash to suggest obvious shunting of blood to his skin for heat loss and chilling. Liver and kidney disease, and thyroid dysfunction have not been found. Plan-C. reactive protein, ANA, sedimentation rate, allergy profile Skin moisturizer, Singulair, Zyrtec/Pepcid for coverage of both H1 and H2 histamine receptors.

## 2012-01-25 NOTE — Telephone Encounter (Signed)
LMTCB to advise of results. I have faxed his labs to his PCP.

## 2012-01-26 NOTE — Telephone Encounter (Signed)
Notes Recorded by Waymon Budge, MD on 01/18/2012 at 7:58 PM CBC blood count and the allergy profile and inflammation markers are all normal. These don't show a reason for the symptoms. We will need to discuss at next ov.   LMOMTCB

## 2012-01-29 NOTE — Telephone Encounter (Signed)
PT MADE AWARE OF LAB RESULTS AND ASKED FOR A COPY TO BE MAILED TO HIS HOME ADDRESS. PT HAS MOVED AND ADDRESS WAS CHANGED TO REFLECT THE NEW ADDRESS.

## 2012-01-29 NOTE — Telephone Encounter (Signed)
Pt returning triage's call for lab results.  Brian Hoover

## 2012-02-01 NOTE — Progress Notes (Signed)
Quick Note:  Brian Hoover, CMA 01/29/2012 9:02 AM Signed PT MADE AWARE OF LAB RESULTS AND ASKED FOR A COPY TO BE MAILED TO HIS HOME ADDRESS. PT HAS MOVED AND ADDRESS WAS CHANGED TO REFLECT THE NEW ADDRESS.   ______

## 2012-02-14 ENCOUNTER — Ambulatory Visit (INDEPENDENT_AMBULATORY_CARE_PROVIDER_SITE_OTHER): Payer: Medicare Other | Admitting: Internal Medicine

## 2012-02-14 ENCOUNTER — Encounter: Payer: Self-pay | Admitting: Internal Medicine

## 2012-02-14 VITALS — BP 118/72 | HR 59 | Ht 70.0 in | Wt 181.6 lb

## 2012-02-14 DIAGNOSIS — L299 Pruritus, unspecified: Secondary | ICD-10-CM

## 2012-02-14 NOTE — Patient Instructions (Signed)
Depo 80  Use up your current supply of montelukast/ Singulair, and then try staying off it for observation.   Continue the H1/H2 antihistamine approach with Zyrtec/ cetirizine, and Pepcid/ fomotidine. These would be the last things stopped if you are doing well.
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
## 2012-02-14 NOTE — Assessment & Plan Note (Signed)
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  Will try to repeat success w/ depomedrol. Prednisone taper tried early w/o benefit. Consider a trial of medrol low dose.

## 2012-03-13 ENCOUNTER — Ambulatory Visit (INDEPENDENT_AMBULATORY_CARE_PROVIDER_SITE_OTHER)
Admission: RE | Admit: 2012-03-13 | Discharge: 2012-03-13 | Disposition: A | Discharge status: Home / Self Care | Payer: Medicare Other | Source: Ambulatory Visit | Attending: Internal Medicine | Admitting: Internal Medicine

## 2012-03-13 ENCOUNTER — Encounter: Payer: Self-pay | Admitting: Internal Medicine

## 2012-03-13 ENCOUNTER — Ambulatory Visit (INDEPENDENT_AMBULATORY_CARE_PROVIDER_SITE_OTHER): Payer: Medicare Other | Admitting: Internal Medicine

## 2012-03-13 VITALS — BP 114/68 | HR 53 | Ht 70.0 in | Wt 175.6 lb

## 2012-03-13 DIAGNOSIS — J209 Acute bronchitis, unspecified: Secondary | ICD-10-CM

## 2012-03-13 DIAGNOSIS — L299 Pruritus, unspecified: Secondary | ICD-10-CM

## 2012-03-13 MED ORDER — GABAPENTIN 100 MG PO CAPS: 100.0000 mg | ORAL_CAPSULE | Freq: Three times a day (TID) | ORAL | Status: DC

## 2012-03-13 NOTE — Progress Notes (Signed)
Quick Note:  LMTCB ______ 

## 2012-03-13 NOTE — Patient Instructions (Addendum)
Script sent for Neurontin/ gabapentin -  To try treating your itching as a form of neuropathy  Order CXR- dx acute bronchitis
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
## 2012-03-13 NOTE — Progress Notes (Signed)
Quick Note:  Pt aware of results per Leigh ______

## 2012-03-17 ENCOUNTER — Encounter: Payer: Self-pay | Admitting: Internal Medicine

## 2012-03-17 NOTE — Assessment & Plan Note (Signed)
We're not seeing any abnormal test results related to a complaint of itching and he found no relationship to medications. This is not an allergy process, which is why he came in the first place. Plan-try gabapentin for the itching. Chest x-ray looking for systemic disease in the context of recent cough and cold. If nothing comes from these, I told him I will not have any more to offer. Consider [facility] referral.

## 2012-10-17 ENCOUNTER — Encounter: Payer: Self-pay | Admitting: Internal Medicine

## 2012-10-29 ENCOUNTER — Ambulatory Visit (INDEPENDENT_AMBULATORY_CARE_PROVIDER_SITE_OTHER): Payer: Medicare Other | Admitting: Internal Medicine

## 2012-10-29 ENCOUNTER — Other Ambulatory Visit: Payer: Self-pay | Admitting: Diagnostic Neuroimaging

## 2012-10-29 ENCOUNTER — Encounter: Payer: Self-pay | Admitting: Internal Medicine

## 2012-10-29 VITALS — BP 136/69 | HR 58 | Ht 70.0 in | Wt 178.0 lb

## 2012-10-29 DIAGNOSIS — I4949 Other premature depolarization: Secondary | ICD-10-CM

## 2012-10-29 DIAGNOSIS — R55 Syncope and collapse: Secondary | ICD-10-CM

## 2012-10-29 DIAGNOSIS — R42 Dizziness and giddiness: Secondary | ICD-10-CM

## 2012-10-29 DIAGNOSIS — I5022 Chronic systolic (congestive) heart failure: Secondary | ICD-10-CM

## 2012-10-29 NOTE — Assessment & Plan Note (Signed)
He has had no significant recurrence of his PVCs. He will continue where his cardiac monitor.

## 2012-10-29 NOTE — Patient Instructions (Signed)
Your physician recommends that you schedule a follow-up appointment in: 3 months with Dr Taylor  

## 2012-10-29 NOTE — Assessment & Plan Note (Signed)
His episodes could be related to tachycardia or bradycardia or not cardiac related at all. As he has only had one significant episode, I recommended watchful waiting. Insertion of an implantable loop recorder would be an option for his care.

## 2012-10-29 NOTE — Assessment & Plan Note (Signed)
It is unclear whether his symptoms are related to worsening heart failure. His left ventricular function has not appreciably changed. He will continue his current medical therapy.

## 2012-10-29 NOTE — Progress Notes (Signed)
HPI Brian Hoover is referred today by Dr. Anne Fu in the setting of syncope and nonsustained ventricular tachycardia. He is a 78 year old man who had symptomatic PVCs and underwent catheter ablation several years ago. This was performed after medical therapy was unsuccessful. Over the last several months, he has had increasing fatigue and weakness. He has also had some dizziness. He notes an acutely bad episode which occurred while walking several weeks ago in which he passed out. He was able to awaken and felt weak and tired. He only made it back home. It appears that he may have passed out more than one time during this episode. He has had none since. After relating this story to Dr. Anne Fu, the patient has worn a cardiac monitor which as demonstrated nonsustained ventricular tachycardia. He has had no recurrent episodes. He continued to feel weak and tired. No obvious medical cause is been demonstrated. Allergies  Allergen Reactions  . Sulfamethoxazole     REACTION: hives     Current Outpatient Prescriptions  Medication Sig Dispense Refill  . albuterol (PROVENTIL HFA;VENTOLIN HFA) 108 (90 BASE) MCG/ACT inhaler Inhale 2 puffs into the lungs every 6 (six) hours as needed.      Marland Kitchen aspirin 81 MG tablet Take 81 mg by mouth daily.      Marland Kitchen CLOMIPHENE CITRATE PO Take 1 tablet by mouth daily.      Marland Kitchen lisinopril (PRINIVIL,ZESTRIL) 20 MG tablet Take 20 mg by mouth daily.      . Multiple Vitamins-Minerals (MULTIVITAMIN WITH MINERALS) tablet Take 1 tablet by mouth daily.      Marland Kitchen OLANZapine (ZYPREXA) 10 MG tablet Take 10 mg by mouth at bedtime.      . valACYclovir (VALTREX) 1000 MG tablet prn         Past Medical History  Diagnosis Date  . Chronic systolic heart failure   . PVC (premature ventricular contraction)   . Bradycardia   . COPD (chronic obstructive pulmonary disease)   . Dyspnea     ROS:   All systems reviewed and negative except as noted in the HPI.   Past Surgical History  Procedure Date   . Tendon repair     right foot  . Shoulder arthroscopy   . Prostate surgery      Family History  Problem Relation Age of Onset  . Breast cancer Mother   . Uterine cancer Mother      History   Social History  . Marital Status: Married    Spouse Name: N/A    Number of Children: N/A  . Years of Education: N/A   Occupational History  . retired Art gallery manager    Social History Main Topics  . Smoking status: Former Smoker -- 1.5 packs/day    Types: Cigarettes    Quit date: 12/04/1960  . Smokeless tobacco: Not on file  . Alcohol Use: Not on file  . Drug Use: Not on file  . Sexually Active: Not on file   Other Topics Concern  . Not on file   Social History Narrative  . No narrative on file     BP 136/69  Pulse 58  Ht 5\' 10"  (1.778 m)  Wt 178 lb (80.74 kg)  BMI 25.54 kg/m2  Physical Exam:  Well appearing 78 year old man, NAD HEENT: Unremarkable Neck:  No JVD, no thyromegally Lungs:  Clear with no wheezes, rales, or rhonchi. HEART:  Regular rate rhythm, no murmurs, no rubs, no clicks Abd:  soft, positive bowel sounds, no organomegally, no  rebound, no guarding Ext:  2 plus pulses, no edema, no cyanosis, no clubbing Skin:  No rashes no nodules Neuro:  CN II through XII intact, motor grossly intact  EKG Sinus bradycardia with left axis deviation  Assess/Plan:

## 2012-11-05 ENCOUNTER — Ambulatory Visit
Admission: RE | Admit: 2012-11-05 | Discharge: 2012-11-05 | Disposition: A | Discharge status: Home / Self Care | Payer: Medicare Other | Source: Ambulatory Visit | Attending: Diagnostic Neuroimaging | Admitting: Diagnostic Neuroimaging

## 2012-11-05 DIAGNOSIS — R42 Dizziness and giddiness: Secondary | ICD-10-CM

## 2012-11-05 MED ORDER — GADOBENATE DIMEGLUMINE 529 MG/ML IV SOLN: 16.0000 mL | Freq: Once | INTRAVENOUS | Status: AC | PRN

## 2013-01-28 DIAGNOSIS — J939 Pneumothorax, unspecified: Secondary | ICD-10-CM

## 2013-01-28 HISTORY — DX: Pneumothorax, unspecified: J93.9

## 2013-01-29 ENCOUNTER — Inpatient Hospital Stay (HOSPITAL_COMMUNITY): Payer: Medicare Other

## 2013-01-29 ENCOUNTER — Emergency Department (HOSPITAL_COMMUNITY): Payer: Medicare Other

## 2013-01-29 ENCOUNTER — Encounter: Payer: Self-pay | Admitting: Internal Medicine

## 2013-01-29 ENCOUNTER — Encounter (HOSPITAL_COMMUNITY): Payer: Self-pay | Admitting: *Deleted

## 2013-01-29 ENCOUNTER — Ambulatory Visit (INDEPENDENT_AMBULATORY_CARE_PROVIDER_SITE_OTHER): Payer: Medicare Other | Admitting: Internal Medicine

## 2013-01-29 ENCOUNTER — Inpatient Hospital Stay (HOSPITAL_COMMUNITY)
Admission: EM | Admit: 2013-01-29 | Discharge: 2013-02-05 | DRG: 199 | Disposition: A | Discharge status: Home Health | Payer: Medicare Other | Attending: Internal Medicine | Admitting: Internal Medicine

## 2013-01-29 VITALS — BP 127/69 | HR 66 | Ht 70.0 in | Wt 178.4 lb

## 2013-01-29 DIAGNOSIS — I5022 Chronic systolic (congestive) heart failure: Secondary | ICD-10-CM

## 2013-01-29 DIAGNOSIS — J939 Pneumothorax, unspecified: Secondary | ICD-10-CM

## 2013-01-29 DIAGNOSIS — R55 Syncope and collapse: Secondary | ICD-10-CM

## 2013-01-29 DIAGNOSIS — Z7982 Long term (current) use of aspirin: Secondary | ICD-10-CM

## 2013-01-29 DIAGNOSIS — E236 Other disorders of pituitary gland: Secondary | ICD-10-CM | POA: Diagnosis present

## 2013-01-29 DIAGNOSIS — S2231XA Fracture of one rib, right side, initial encounter for closed fracture: Secondary | ICD-10-CM

## 2013-01-29 DIAGNOSIS — J449 Chronic obstructive pulmonary disease, unspecified: Secondary | ICD-10-CM

## 2013-01-29 DIAGNOSIS — W010XXA Fall on same level from slipping, tripping and stumbling without subsequent striking against object, initial encounter: Secondary | ICD-10-CM | POA: Diagnosis present

## 2013-01-29 DIAGNOSIS — Y9239 Other specified sports and athletic area as the place of occurrence of the external cause: Secondary | ICD-10-CM

## 2013-01-29 DIAGNOSIS — L299 Pruritus, unspecified: Secondary | ICD-10-CM

## 2013-01-29 DIAGNOSIS — G92 Toxic encephalopathy: Secondary | ICD-10-CM | POA: Diagnosis not present

## 2013-01-29 DIAGNOSIS — J930 Spontaneous tension pneumothorax: Secondary | ICD-10-CM

## 2013-01-29 DIAGNOSIS — J432 Centrilobular emphysema: Secondary | ICD-10-CM | POA: Diagnosis present

## 2013-01-29 DIAGNOSIS — I493 Ventricular premature depolarization: Secondary | ICD-10-CM | POA: Diagnosis present

## 2013-01-29 DIAGNOSIS — I4949 Other premature depolarization: Secondary | ICD-10-CM | POA: Diagnosis present

## 2013-01-29 DIAGNOSIS — S2241XA Multiple fractures of ribs, right side, initial encounter for closed fracture: Secondary | ICD-10-CM

## 2013-01-29 DIAGNOSIS — I498 Other specified cardiac arrhythmias: Secondary | ICD-10-CM

## 2013-01-29 DIAGNOSIS — S2242XA Multiple fractures of ribs, left side, initial encounter for closed fracture: Secondary | ICD-10-CM

## 2013-01-29 DIAGNOSIS — R0602 Shortness of breath: Secondary | ICD-10-CM

## 2013-01-29 DIAGNOSIS — J069 Acute upper respiratory infection, unspecified: Secondary | ICD-10-CM

## 2013-01-29 DIAGNOSIS — S270XXA Traumatic pneumothorax, initial encounter: Principal | ICD-10-CM

## 2013-01-29 DIAGNOSIS — Y93E1 Activity, personal bathing and showering: Secondary | ICD-10-CM

## 2013-01-29 DIAGNOSIS — Z79899 Other long term (current) drug therapy: Secondary | ICD-10-CM

## 2013-01-29 DIAGNOSIS — S2249XA Multiple fractures of ribs, unspecified side, initial encounter for closed fracture: Secondary | ICD-10-CM

## 2013-01-29 DIAGNOSIS — J4489 Other specified chronic obstructive pulmonary disease: Secondary | ICD-10-CM | POA: Diagnosis present

## 2013-01-29 DIAGNOSIS — K5900 Constipation, unspecified: Secondary | ICD-10-CM | POA: Diagnosis not present

## 2013-01-29 DIAGNOSIS — Y9210 Unspecified residential institution as the place of occurrence of the external cause: Secondary | ICD-10-CM | POA: Diagnosis not present

## 2013-01-29 DIAGNOSIS — Z87891 Personal history of nicotine dependence: Secondary | ICD-10-CM

## 2013-01-29 DIAGNOSIS — G929 Unspecified toxic encephalopathy: Secondary | ICD-10-CM | POA: Diagnosis not present

## 2013-01-29 DIAGNOSIS — T40605A Adverse effect of unspecified narcotics, initial encounter: Secondary | ICD-10-CM | POA: Diagnosis not present

## 2013-01-29 HISTORY — DX: Pneumothorax, unspecified: J93.9

## 2013-01-29 HISTORY — DX: Gastro-esophageal reflux disease without esophagitis: K21.9

## 2013-01-29 HISTORY — DX: Heart failure, unspecified: I50.9

## 2013-01-29 HISTORY — PX: CHEST TUBE INSERTION: SHX231

## 2013-01-29 LAB — CBC WITH DIFFERENTIAL/PLATELET
Basophils Relative: 0 % (ref 0–1)
Eosinophils Absolute: 0 10*3/uL (ref 0.0–0.7)
Hemoglobin: 15.8 g/dL (ref 13.0–17.0)
MCH: 31.5 pg (ref 26.0–34.0)
MCHC: 35.1 g/dL (ref 30.0–36.0)
Monocytes Relative: 10 % (ref 3–12)
Neutro Abs: 9.3 10*3/uL — ABNORMAL HIGH (ref 1.7–7.7)
Neutrophils Relative %: 85 % — ABNORMAL HIGH (ref 43–77)
Platelets: 245 10*3/uL (ref 150–400)
RBC: 5.01 MIL/uL (ref 4.22–5.81)

## 2013-01-29 LAB — BASIC METABOLIC PANEL
BUN: 28 mg/dL — ABNORMAL HIGH (ref 6–23)
Chloride: 96 mEq/L (ref 96–112)
GFR calc Af Amer: >90 mL/min (ref >90)
GFR calc non Af Amer: 79 mL/min — ABNORMAL LOW (ref >90)
Potassium: 4.9 mEq/L (ref 3.5–5.1)
Sodium: 134 mEq/L — ABNORMAL LOW (ref 135–145)

## 2013-01-29 LAB — PROTIME-INR
INR: 0.92 (ref 0.00–1.49)
Prothrombin Time: 12.3 seconds (ref 11.6–15.2)

## 2013-01-29 MED ORDER — MORPHINE SULFATE 4 MG/ML IJ SOLN
4.0000 mg | INTRAMUSCULAR | Status: DC | PRN
  Administered 2013-01-29 – 2013-01-31 (×7): 4 mg via INTRAVENOUS
  Filled 2013-01-29 (×8): qty 1

## 2013-01-29 MED ORDER — ONDANSETRON HCL 4 MG PO TABS: 4.0000 mg | ORAL_TABLET | Freq: Four times a day (QID) | ORAL | Status: DC | PRN

## 2013-01-29 MED ORDER — LISINOPRIL 20 MG PO TABS
20.0000 mg | ORAL_TABLET | Freq: Every day | ORAL | Status: DC
  Administered 2013-01-30 – 2013-02-05 (×7): 20 mg via ORAL
  Filled 2013-01-29 (×7): qty 1

## 2013-01-29 MED ORDER — VITAMIN D3 25 MCG (1000 UNIT) PO TABS
1000.0000 [IU] | ORAL_TABLET | Freq: Every day | ORAL | Status: DC
  Administered 2013-01-30 – 2013-02-05 (×7): 1000 [IU] via ORAL
  Filled 2013-01-29 (×7): qty 1

## 2013-01-29 MED ORDER — VALACYCLOVIR HCL 500 MG PO TABS
500.0000 mg | ORAL_TABLET | Freq: Every day | ORAL | Status: DC
  Administered 2013-01-30 – 2013-02-05 (×7): 500 mg via ORAL
  Filled 2013-01-29 (×7): qty 1

## 2013-01-29 MED ORDER — SODIUM CHLORIDE 0.9 % IJ SOLN
3.0000 mL | Freq: Two times a day (BID) | INTRAMUSCULAR | Status: DC
  Administered 2013-01-29 – 2013-02-05 (×11): 3 mL via INTRAVENOUS

## 2013-01-29 MED ORDER — MORPHINE SULFATE 4 MG/ML IJ SOLN
4.0000 mg | Freq: Once | INTRAMUSCULAR | Status: AC
  Administered 2013-01-29: 4 mg via INTRAVENOUS
  Filled 2013-01-29: qty 1

## 2013-01-29 MED ORDER — SODIUM CHLORIDE 0.9 % IV SOLN
INTRAVENOUS | Status: AC
  Administered 2013-01-29: 22:00:00 via INTRAVENOUS

## 2013-01-29 MED ORDER — ONDANSETRON HCL 4 MG/2ML IJ SOLN: 4.0000 mg | Freq: Four times a day (QID) | INTRAMUSCULAR | Status: DC | PRN

## 2013-01-29 MED ORDER — BIOTENE DRY MOUTH MT LIQD
15.0000 mL | Freq: Two times a day (BID) | OROMUCOSAL | Status: DC
  Administered 2013-01-30 – 2013-02-02 (×7): 15 mL via OROMUCOSAL

## 2013-01-29 MED ORDER — ADULT MULTIVITAMIN W/MINERALS CH
1.0000 | ORAL_TABLET | Freq: Every day | ORAL | Status: DC
  Administered 2013-01-30 – 2013-02-05 (×7): 1 via ORAL
  Filled 2013-01-29 (×8): qty 1

## 2013-01-29 MED ORDER — ALBUTEROL SULFATE (5 MG/ML) 0.5% IN NEBU: 2.5000 mg | INHALATION_SOLUTION | RESPIRATORY_TRACT | Status: DC | PRN

## 2013-01-29 MED ORDER — IPRATROPIUM BROMIDE 0.02 % IN SOLN
0.5000 mg | Freq: Four times a day (QID) | RESPIRATORY_TRACT | Status: DC
  Administered 2013-01-29 – 2013-01-31 (×9): 0.5 mg via RESPIRATORY_TRACT
  Filled 2013-01-29 (×9): qty 2.5

## 2013-01-29 MED ORDER — ALBUTEROL SULFATE (5 MG/ML) 0.5% IN NEBU
2.5000 mg | INHALATION_SOLUTION | Freq: Four times a day (QID) | RESPIRATORY_TRACT | Status: DC
  Administered 2013-01-29 – 2013-01-31 (×9): 2.5 mg via RESPIRATORY_TRACT
  Filled 2013-01-29 (×9): qty 0.5

## 2013-01-29 MED ORDER — MULTI-VITAMIN/MINERALS PO TABS: 1.0000 | ORAL_TABLET | Freq: Every day | ORAL | Status: DC

## 2013-01-29 MED ORDER — HYDROCODONE-ACETAMINOPHEN 5-325 MG PO TABS
1.0000 | ORAL_TABLET | ORAL | Status: DC | PRN
  Administered 2013-01-29 – 2013-01-31 (×5): 2 via ORAL
  Filled 2013-01-29 (×5): qty 2

## 2013-01-29 MED ORDER — ASPIRIN EC 81 MG PO TBEC
81.0000 mg | DELAYED_RELEASE_TABLET | Freq: Every day | ORAL | Status: DC
  Administered 2013-01-30 – 2013-02-05 (×7): 81 mg via ORAL
  Filled 2013-01-29 (×7): qty 1

## 2013-01-29 MED ORDER — OLANZAPINE 10 MG PO TABS
10.0000 mg | ORAL_TABLET | Freq: Every day | ORAL | Status: DC
  Administered 2013-01-29 – 2013-02-04 (×7): 10 mg via ORAL
  Filled 2013-01-29 (×8): qty 1

## 2013-01-29 MED ORDER — ACETAMINOPHEN 650 MG RE SUPP: 650.0000 mg | Freq: Four times a day (QID) | RECTAL | Status: DC | PRN

## 2013-01-29 MED ORDER — ACETAMINOPHEN 325 MG PO TABS: 650.0000 mg | ORAL_TABLET | Freq: Four times a day (QID) | ORAL | Status: DC | PRN

## 2013-01-29 MED ORDER — ENOXAPARIN SODIUM 40 MG/0.4ML ~~LOC~~ SOLN
40.0000 mg | SUBCUTANEOUS | Status: DC
  Administered 2013-01-29 – 2013-02-04 (×7): 40 mg via SUBCUTANEOUS
  Filled 2013-01-29 (×8): qty 0.4

## 2013-01-29 MED ORDER — ASPIRIN 81 MG PO TABS
81.0000 mg | ORAL_TABLET | Freq: Every day | ORAL | Status: DC
Start: 2013-01-30 — End: 2013-01-29

## 2013-01-29 NOTE — Progress Notes (Signed)
HPI Mr. Escue returns today for followup. He is a 79 year old man with a history of palpitations and documented PVCs and nonsustained ventricular tachycardia, who underwent catheter ablation several years ago. Post procedure, his ventricular arrhythmias have resolved however his left ventricular function remains depressed. Over the last several months he has had one episode of frank syncope, of unclear etiology. In the last few weeks, he has had episodes where he has fallen. On his bike, he has fallen twice, and his wife notices that he tends to drive off to the left, without his foot, but still fall. Yesterday, he slipped in the shower and fell. He denies syncope. He is very clear that he did not pass out. He has not had palpitations. Allergies  Allergen Reactions  . Sulfamethoxazole     REACTION: hives     Current Outpatient Prescriptions  Medication Sig Dispense Refill  . albuterol (PROVENTIL HFA;VENTOLIN HFA) 108 (90 BASE) MCG/ACT inhaler Inhale 2 puffs into the lungs every 6 (six) hours as needed.      Marland Kitchen aspirin 81 MG tablet Take 81 mg by mouth daily.      Marland Kitchen CLOMIPHENE CITRATE PO Take 1 tablet by mouth daily.      Marland Kitchen lisinopril (PRINIVIL,ZESTRIL) 20 MG tablet Take 20 mg by mouth daily.      . Multiple Vitamins-Minerals (MULTIVITAMIN WITH MINERALS) tablet Take 1 tablet by mouth daily.      Marland Kitchen OLANZapine (ZYPREXA) 10 MG tablet Take 10 mg by mouth at bedtime.      . valACYclovir (VALTREX) 1000 MG tablet prn       No current facility-administered medications for this visit.     Past Medical History  Diagnosis Date  . Chronic systolic heart failure   . PVC (premature ventricular contraction)   . Bradycardia   . COPD (chronic obstructive pulmonary disease)   . Dyspnea     ROS:   All systems reviewed and negative except as noted in the HPI.   Past Surgical History  Procedure Laterality Date  . Tendon repair      right foot  . Shoulder arthroscopy    . Prostate surgery        Family History  Problem Relation Age of Onset  . Breast cancer Mother   . Uterine cancer Mother      History   Social History  . Marital Status: Married    Spouse Name: N/A    Number of Children: N/A  . Years of Education: N/A   Occupational History  . retired Art gallery manager    Social History Main Topics  . Smoking status: Former Smoker -- 1.50 packs/day    Types: Cigarettes    Quit date: 12/04/1960  . Smokeless tobacco: Not on file  . Alcohol Use: Not on file  . Drug Use: Not on file  . Sexually Active: Not on file   Other Topics Concern  . Not on file   Social History Narrative  . No narrative on file     BP 127/69  Pulse 66  Ht 5\' 10"  (1.778 m)  Wt 178 lb 6.4 oz (80.922 kg)  BMI 25.6 kg/m2  Physical Exam:  Well appearing elderly man,NAD HEENT: Unremarkable Neck:  7 cm JVD, no thyromegally Lungs:  Clear with no wheezes, rales, or rhonchi. His right back is tender to palpation. HEART:  IRegular rate rhythm, no murmurs, no rubs, no clicks Abd:  soft, positive bowel sounds, no organomegally, no rebound, no guarding Ext:  2  plus pulses, no edema, no cyanosis, no clubbing Skin:  No rashes no nodules Neuro:  CN II through XII intact, motor grossly intact  Assess/Plan:

## 2013-01-29 NOTE — Assessment & Plan Note (Signed)
These appear quiet after ablation. He will undergo watchful waiting.

## 2013-01-29 NOTE — ED Provider Notes (Signed)
History     CSN: [id number]  Arrival date & time 01/29/13  1455   First MD Initiated Contact with Patient 01/29/13 1517      Chief Complaint  Patient presents with  . Collapsed Lung   . Fall    (Consider location/radiation/quality/duration/timing/severity/associated sxs/prior treatment) The history is provided by the patient.  Brian Hoover is a 79 y.o. male hx of PVC, COPD here s/p fall. And fell in the shower yesterday at [facility]. Landed on the right side and since then has some right rib pain. He also had some mild shortness of breath. Went to see PMD and had an x-ray that showed 3 rib fractures as well as pneumothorax. Sent in for further evaluation.   Past Medical History  Diagnosis Date  . Chronic systolic heart failure   . PVC (premature ventricular contraction)   . Bradycardia   . COPD (chronic obstructive pulmonary disease)   . Dyspnea     Past Surgical History  Procedure Laterality Date  . Tendon repair      right foot  . Shoulder arthroscopy    . Prostate surgery    . Clavicle surgery      Family History  Problem Relation Age of Onset  . Breast cancer Mother   . Uterine cancer Mother     History  Substance Use Topics  . Smoking status: Former Smoker -- 1.50 packs/day    Types: Cigarettes    Quit date: 12/04/1960  . Smokeless tobacco: Not on file  . Alcohol Use: Yes     Comment: freq      Review of Systems  Respiratory: Positive for shortness of breath.   Musculoskeletal:       Rib pain   All other systems reviewed and are negative.    Allergies  Sulfa antibiotics and Sulfamethoxazole  Home Medications   Current Outpatient Rx  Name  Route  Sig  Dispense  Refill  . albuterol (PROVENTIL HFA;VENTOLIN HFA) 108 (90 BASE) MCG/ACT inhaler   Inhalation   Inhale 2 puffs into the lungs every 6 (six) hours as needed for wheezing.          Marland Kitchen aspirin 81 MG tablet   Oral   Take 81 mg by mouth daily.         . cholecalciferol (VITAMIN D)  1000 UNITS tablet   Oral   Take 1,000 Units by mouth daily.         Marland Kitchen lisinopril (PRINIVIL,ZESTRIL) 20 MG tablet   Oral   Take 20 mg by mouth daily.         . Multiple Vitamins-Minerals (MULTIVITAMIN WITH MINERALS) tablet   Oral   Take 1 tablet by mouth daily.         Marland Kitchen OLANZapine (ZYPREXA) 10 MG tablet   Oral   Take 10 mg by mouth at bedtime.         . valACYclovir (VALTREX) 500 MG tablet   Oral   Take 500 mg by mouth daily.           BP 152/77  Pulse 66  Temp(Src) 98.6 F (37 C) (Oral)  Resp 20  SpO2 91%  Physical Exam  Nursing note and vitals reviewed. Constitutional: He is oriented to person, place, and time. He appears well-developed and well-nourished.  Slightly uncomfortable   HENT:  Head: Normocephalic.  Mouth/Throat: Oropharynx is clear and moist.  Eyes: Conjunctivae are normal. Pupils are equal, round, and reactive to light.  Neck: Normal  range of motion. Neck supple.  Cardiovascular: Normal rate, regular rhythm and normal heart sounds.   Pulmonary/Chest: Effort normal.  Dec breath sounds on the R side. + bruise R paralumbar area on the ribs. + reproducible tenderness on palpation of those ribs.   Abdominal: Soft. Bowel sounds are normal. He exhibits no distension. There is no tenderness. There is no rebound.  Musculoskeletal: Normal range of motion.  Neurological: He is alert and oriented to person, place, and time.  Skin: Skin is warm and dry.  Psychiatric: He has a normal mood and affect. His behavior is normal. Judgment and thought content normal.    ED Course  Procedures (including critical care time)  Labs Reviewed  CBC WITH DIFFERENTIAL - Abnormal; Notable for the following:    WBC 10.9 (*)    Neutrophils Relative 85 (*)    Neutro Abs 9.3 (*)    Lymphocytes Relative 5 (*)    Lymphs Abs 0.6 (*)    Monocytes Absolute 1.1 (*)    All other components within normal limits  PROTIME-INR  BASIC METABOLIC PANEL   Dg Chest 2  View  01/29/2013  *RADIOLOGY REPORT*  Clinical Data: Follow up pneumothorax  CHEST - 2 VIEW  Comparison: 01/29/2013  Findings: There are multiple mildly displaced rib fractures identified.  This involves the right fifth, sixth and seventh ribs.  The patient has a large right-sided pneumothorax which measures approximately 50%.  This is slightly increased in volume from previous exam.  Heart size is normal.  No effusions or edema.  Left lung is clear. Prior ORIF of left clavicle fracture.  IMPRESSION:  1.  Right rib fractures. 2.  Increase in volume of right-sided pneumothorax.  Now approximately 50%.   Original Report Authenticated By: Signa Kell, M.D.      No diagnosis found.   Date: 01/29/2013  Rate:68  Rhythm: normal sinus rhythm and premature ventricular contractions (PVC)  QRS Axis: normal  Intervals: normal  ST/T Wave abnormalities: nonspecific ST changes  Conduction Disutrbances:none  Narrative Interpretation:   Old EKG Reviewed: unchanged     MDM  CHIP CANEPA is a 79 y.o. male here with R rib pain, pneumothorax. I was unable to view the images. Will repeat xray and give pain meds and reassess.   4:51 PM CXR showed R sided 50% pneumothorax. I called Dr. Tyrone Sage, who will place chest tube. He requested medicine to admit. I called Dr. Daleen Bo, who accepted the patient on stepdown.         [name], MD 01/29/13 [phone]

## 2013-01-29 NOTE — Assessment & Plan Note (Signed)
His symptoms are currently class I, and he is well compensated. He will continue his current medical therapy.

## 2013-01-29 NOTE — Patient Instructions (Addendum)
Continue current medication as prescribed  Your physician wants you to follow-up in: 6 months with Dr. Ladona Ridgel.  You will receive a reminder letter in the mail two months in advance. If you don't receive a letter, please call our office to schedule the follow-up appointment.

## 2013-01-29 NOTE — Assessment & Plan Note (Signed)
He has had no recurrent syncope since his last visit several months ago. He is very certain that his falls are not at all related to passing out. I've asked the patient to be more careful on standing.

## 2013-01-29 NOTE — H&P (Addendum)
[facility] History and Physical  KOHL POLINSKY ZOX:[id number] DOB: 08-13-1932 DOA: 01/29/2013  Referring physician: Dr. Chaney Malling, EDP PCP: Berenda Morale, MD  OP Specialists:  1. Cardiology: Dr. Donato Schultz. 2. EPS: Dr Lewayne Bunting 3. Pulmonology: Dr. Jetty Duhamel  Chief Complaint: Right-sided chest pain following a fall.  HPI: Brian Hoover is a 79 y.o. male with history of palpitations, documented PVCs and NSVT who underwent catheter ablation several years ago. Post procedure, his ventricular arrhythmias have resolved but his left ventricular function remains depressed. He has had one episode of syncope and a few falls. Yesterday, patient slipped in the shower at the [facility] and fell landing on right side of his back. He started experiencing excruciating right-sided back and anterior chest pain which was made worse with movement or deep breaths but was not associated with dyspnea. He denies passing out, head or neck injury or bleeding. He had difficulty getting up but eventually got up by himself and drove home. He took a dose of his wife's oxycodone with minimal relief. He had mild nausea but no vomiting. This morning, he kept up as scheduled appointment with Dr. Ladona Ridgel who advised him to followup with his PCP with a chest x-ray. Chest x-ray at the PCPs office apparently showed right rib fractures and pneumothorax and patient was advised to come to the ED immediately. In the ED, chest x-ray shows multiple mildly displaced right rib fractures (5-7) and large right-sided pneumothorax measuring approximately 50%. TCTS was consulted by EDP and have placed a right-sided chest tube and request hospitalist service to admit for further evaluation and management.   Review of Systems: All systems reviewed and apart from history of presenting illness, are negative  Past Medical History  Diagnosis Date  . Chronic systolic heart failure   . PVC (premature ventricular contraction)   . Bradycardia    . COPD (chronic obstructive pulmonary disease)   . Dyspnea    Past Surgical History  Procedure Laterality Date  . Tendon repair      right foot  . Shoulder arthroscopy    . Prostate surgery    . Clavicle surgery     Social History:  reports that he quit smoking about 52 years ago. His smoking use included Cigarettes. He smoked 1.50 packs per day. He does not have any smokeless tobacco history on file. He reports that  drinks alcohol. He reports that he does not use illicit drugs. Married. Lives with spouse. Independent of activities of daily living.  Allergies  Allergen Reactions  . Sulfa Antibiotics   . Sulfamethoxazole     REACTION: hives    Family History  Problem Relation Age of Onset  . Breast cancer Mother   . Uterine cancer Mother     Prior to Admission medications   Medication Sig Start Date End Date Taking? Authorizing Provider  albuterol (PROVENTIL HFA;VENTOLIN HFA) 108 (90 BASE) MCG/ACT inhaler Inhale 2 puffs into the lungs every 6 (six) hours as needed for wheezing.    Yes Historical Provider, MD  aspirin 81 MG tablet Take 81 mg by mouth daily.   Yes Historical Provider, MD  cholecalciferol (VITAMIN D) 1000 UNITS tablet Take 1,000 Units by mouth daily.   Yes Historical Provider, MD  lisinopril (PRINIVIL,ZESTRIL) 20 MG tablet Take 20 mg by mouth daily.   Yes Historical Provider, MD  Multiple Vitamins-Minerals (MULTIVITAMIN WITH MINERALS) tablet Take 1 tablet by mouth daily.   Yes Historical Provider, MD  OLANZapine (ZYPREXA) 10 MG tablet Take  10 mg by mouth at bedtime.   Yes Historical Provider, MD  valACYclovir (VALTREX) 500 MG tablet Take 500 mg by mouth daily.   Yes Historical Provider, MD   Physical Exam: Filed Vitals:   01/29/13 1500 01/29/13 1645 01/29/13 1700 01/29/13 1710  BP: 152/77 145/75 144/66 158/66  Pulse: 66 65 61 64  Temp: 98.6 F (37 C)     TempSrc: Oral     Resp: 20 21 24 24   SpO2: 91% 100% 100% 100%     General exam: Moderately built  and nourished male patient, lying propped up on the gurney in mild painful distress  Head, eyes and ENT: Nontraumatic and normocephalic. Pupils equally reacting to light and accommodation. Oral mucosa slightly dry.  Neck: Supple. No JVD, carotid bruit or thyromegaly.  Lymphatics: No lymphadenopathy.  Respiratory system: Reduced breath sounds in right lung fields with occasional rhonchi and a few basal crackles. A right chest tube in place. Left lung fields clear to auscultation. No increased work of breathing.  Cardiovascular system: S1 and S2 heard, RRR. No JVD, murmurs, gallops, clicks or pedal edema.  Gastrointestinal system: Abdomen is nondistended, soft and nontender. Normal bowel sounds heard. No organomegaly or masses appreciated.  Central nervous system: Alert and oriented. No focal neurological deficits.  Extremities: Symmetric 5 x 5 power. Peripheral pulses symmetrically felt.  Skin: No rashes or acute findings.  Musculoskeletal system: Negative exam.  Psychiatry: Pleasant and cooperative.   Labs on Admission:  Basic Metabolic Panel:  Recent Labs Lab 01/29/13 1604  NA 134*  K 4.9  CL 96  CO2 29  GLUCOSE 127*  BUN 28*  CREATININE 0.89  CALCIUM 10.0   Liver Function Tests: No results found for this basename: AST, ALT, ALKPHOS, BILITOT, PROT, ALBUMIN,  in the last 168 hours No results found for this basename: LIPASE, AMYLASE,  in the last 168 hours No results found for this basename: AMMONIA,  in the last 168 hours CBC:  Recent Labs Lab 01/29/13 1604  WBC 10.9*  NEUTROABS 9.3*  HGB 15.8  HCT 45.0  MCV 89.8  PLT 245   Cardiac Enzymes: No results found for this basename: CKTOTAL, CKMB, CKMBINDEX, TROPONINI,  in the last 168 hours  BNP (last 3 results) No results found for this basename: PROBNP,  in the last 8760 hours CBG: No results found for this basename: GLUCAP,  in the last 168 hours  Radiological Exams on Admission: Dg Chest 2  View  01/29/2013  *RADIOLOGY REPORT*  Clinical Data: Follow up pneumothorax  CHEST - 2 VIEW  Comparison: 01/29/2013  Findings: There are multiple mildly displaced rib fractures identified.  This involves the right fifth, sixth and seventh ribs.  The patient has a large right-sided pneumothorax which measures approximately 50%.  This is slightly increased in volume from previous exam.  Heart size is normal.  No effusions or edema.  Left lung is clear. Prior ORIF of left clavicle fracture.  IMPRESSION:  1.  Right rib fractures. 2.  Increase in volume of right-sided pneumothorax.  Now approximately 50%.   Original Report Authenticated By: Signa Kell, M.D.     EKG: Independently reviewed. Sinus rhythm with occasional PVCs, normal axis and nonspecific ST-T wave changes. No acute abnormalities.  Assessment/Plan Principal Problem:   Traumatic pneumothorax (Right) Active Problems:   PREMATURE VENTRICULAR CONTRACTIONS, FREQUENT   Chronic systolic heart failure   COPD   Multiple rib fractures (Right)   1. Right-sided traumatic pneumothorax and multiple rib fractures (5-7),  status post fall: TCTS consulted and have placed a chest tube. Management per TCTS. Pain management and oxygen. 2. History of PVCs/NSVT status post ablation: Monitor on telemetry. 3. History of chronic systolic CHF: Is not on Lasix at home. Currently euvolemic or even slightly dry on clinical exam. Brief IV fluids. Monitor 4. COPD: Stable.     Code Status: Full  Family Communication: Discussed with patient spouse at the bedside.  Disposition Plan: Home when medically stable.   Time spent: 55 minutes  [facility] Pager [phone]  If 7PM-7AM, please contact night-coverage [email or web address] Password [facility] 01/29/2013, 5:45 PM

## 2013-01-29 NOTE — Progress Notes (Signed)
Pt was admitted from ED , VSS,  Chest tube to suction , A/O

## 2013-01-29 NOTE — Procedures (Signed)
Chest Tube Insertion Procedure Note  Indications:  Clinically significant Pneumothorax  Pre-operative Diagnosis: Pneumothorax  Post-operative Diagnosis: Pneumothorax  Procedure Details  Informed consent was obtained for the procedure, including sedation.  Risks of lung perforation, hemorrhage, arrhythmia, and adverse drug reaction were discussed.   After sterile skin prep, using standard technique, a 28 French tube was placed in the right lateral 5 rib space.  Findings: None and rush of air  Estimated Blood Loss:  Minimal         Specimens:  None              Complications:  None; patient tolerated the procedure well.         Disposition: will be admitted to step down bed         Condition: stable  Attending Attestation: I performed the procedure.   Delight Ovens MD  Beeper [phone] Office [phone] 01/29/2013 5:34 PM

## 2013-01-29 NOTE — Consult Note (Addendum)
[address]            [place] [address]          [id number]      FORBES LOLL [facility] Medical Record #[id number] Date of Birth: Sep 27, 1932  Referring: No ref. provider found Primary Care: Berenda Morale, MD  Chief Complaint:    Chief Complaint  Patient presents with  . Collapsed Lung   . Fall    History of Present Illness:     Patient is a 79 y.o. male hx of PVC, COPD who fell  in the shower  at [facility] yesterday. Landed on the right side and since then has some right rib pain. He also had some mild shortness of breath. Went to see PMD and had an x-ray that showed 3 rib fractures as well as pneumothorax. He denies syncope as cause of fall, remembers slipping on floor.     Current Activity/ Functional Status:  Patient is independent with mobility/ambulation, transfers, ADL's, IADL's.  Zubrod Score: At the time of surgery this patient's most appropriate activity status/level should be described as: [x]  Normal activity, no symptoms []  Symptoms, fully ambulatory []  Symptoms, in bed less than or equal to 50% of the time []  Symptoms, in bed greater than 50% of the time but less than 100% []  Bedridden []  Moribund   Past Medical History  Diagnosis Date  . Chronic systolic heart failure   . PVC (premature ventricular contraction)   . Bradycardia   . COPD (chronic obstructive pulmonary disease)   . Dyspnea     Past Surgical History  Procedure Laterality Date  . Tendon repair      right foot  . Shoulder arthroscopy    . Prostate surgery    . Clavicle surgery      Family History  Problem Relation Age of Onset  . Breast cancer Mother   . Uterine cancer Mother     History   Social History  . Marital Status: Married    Spouse Name: N/A    Number of Children: N/A  . Years of Education: N/A   Occupational History  . retired Art gallery manager    Social History Main Topics  . Smoking status: Former Smoker -- 1.50 packs/day   Types: Cigarettes    Quit date: 12/04/1960  . Smokeless tobacco: Not on file  . Alcohol Use: Yes     Comment: freq  . Drug Use: No  . Sexually Active: Not on file   Other Topics Concern  . Not on file   Social History Narrative  . No narrative on file    History  Smoking status  . Former Smoker -- 1.50 packs/day  . Types: Cigarettes  . Quit date: 12/04/1960  Smokeless tobacco  . Not on file    History  Alcohol Use  . Yes    Comment: freq     Allergies  Allergen Reactions  . Sulfa Antibiotics   . Sulfamethoxazole     REACTION: hives    No current facility-administered medications for this encounter.   Current Outpatient Prescriptions  Medication Sig Dispense Refill  . albuterol (PROVENTIL HFA;VENTOLIN HFA) 108 (90 BASE) MCG/ACT inhaler Inhale 2 puffs into the lungs every 6 (six) hours as needed for wheezing.       Marland Kitchen aspirin 81 MG tablet Take 81 mg by mouth daily.      Marland Kitchen  cholecalciferol (VITAMIN D) 1000 UNITS tablet Take 1,000 Units by mouth daily.      Marland Kitchen lisinopril (PRINIVIL,ZESTRIL) 20 MG tablet Take 20 mg by mouth daily.      . Multiple Vitamins-Minerals (MULTIVITAMIN WITH MINERALS) tablet Take 1 tablet by mouth daily.      Marland Kitchen OLANZapine (ZYPREXA) 10 MG tablet Take 10 mg by mouth at bedtime.      . valACYclovir (VALTREX) 500 MG tablet Take 500 mg by mouth daily.           Review of Systems:     Cardiac Review of Systems: Y or N  Chest Pain [   y ]  Resting SOB [ y ] Exertional SOB  Cove.Etienne  ]  Orthopnea [ n ]   Pedal Edema [ n  ]    Palpitations Cove.Etienne  ] Syncope  [ n ]   Presyncope [ n  ]  General Review of Systems: [Y] = yes [  ]=no Constitional: recent weight change [n  ]; anorexia [  ]; fatigue [  ]; nausea [  ]; night sweats [  ]; fever [  ]; or chills [  ];                                                                                                                                          Dental: poor dentition[  ]; Last Dentist visit:   Eye : blurred  vision [  ]; diplopia [   ]; vision changes [  ];  Amaurosis fugax[  ]; Resp: cough [  ];  wheezing[ n ];  hemoptysis[n  ]; shortness of breath[y  ]; paroxysmal nocturnal dyspnea[ n ]; dyspnea on exertion[ y]; or orthopnea[  ];  GI:  gallstones[  ], vomiting[  ];  dysphagia[  ]; melena[  ];  hematochezia [  ]; heartburn[  ];   Hx of  Colonoscopy[  ]; GU: kidney stones [  ]; hematuria[  ];   dysuria [  ];  nocturia[  ];  history of     obstruction [  ]; urinary frequency [ y ]             Skin: rash, swelling[  ];, hair loss[  ];  peripheral edema[  ];  or itching[  ]; Musculosketetal: myalgias[  ];  joint swelling[  ];  joint erythema[  ];  joint pain[  ];  back pain[  ];  Heme/Lymph: bruising[  ];  bleeding[  ];  anemia[  ];  Neuro: TIA[n  ];  headaches[  ];  stroke[ n ];  vertigo[  ];  seizures[  ];   paresthesias[  ];  difficulty walking[  ];  Psych:depression[  ]; anxiety[  ];  Endocrine: diabetes[ n ];  thyroid dysfunction[  ];  Immunizations: Flu [ no recored ]; Pneumococcal[ no record ];  Other:  Physical Exam: BP 158/66  Pulse 64  Temp(Src) 98.6 F (37 C) (Oral)  Resp 24  SpO2 100%  General appearance: alert, cooperative, appears stated age and mild distress Neurologic: intact Heart: regular rate and rhythm, S1, S2 normal, no murmur, click, rub or gallop and normal apical impulse Lungs: diminished breath sounds RLL, RML and RUL Abdomen: soft, non-tender; bowel sounds normal; no masses,  no organomegaly Extremities: extremities normal, atraumatic, no cyanosis or edema and Homans sign is negative, no sign of DVT previous left clavicle fracture with hardware   Diagnostic Studies & Laboratory data:     Recent Radiology Findings:   Dg Chest 2 View  01/29/2013  *RADIOLOGY REPORT*  Clinical Data: Follow up pneumothorax  CHEST - 2 VIEW  Comparison: 01/29/2013  Findings: There are multiple mildly displaced rib fractures identified.  This involves the right fifth, sixth and seventh  ribs.  The patient has a large right-sided pneumothorax which measures approximately 50%.  This is slightly increased in volume from previous exam.  Heart size is normal.  No effusions or edema.  Left lung is clear. Prior ORIF of left clavicle fracture.  IMPRESSION:  1.  Right rib fractures. 2.  Increase in volume of right-sided pneumothorax.  Now approximately 50%.   Original Report Authenticated By: Signa Kell, M.D.       Recent Lab Findings: Lab Results  Component Value Date   WBC 10.9* 01/29/2013   HGB 15.8 01/29/2013   HCT 45.0 01/29/2013   PLT 245 01/29/2013   GLUCOSE 127* 01/29/2013   NA 134* 01/29/2013   K 4.9 01/29/2013   CL 96 01/29/2013   CREATININE 0.89 01/29/2013   BUN 28* 01/29/2013   CO2 29 01/29/2013   INR 0.92 01/29/2013      Assessment / Plan:   Right PTX 50 %  with rib fracture from fall 24 hours ago Discussed DX with Patient and recommended placement of Chest tube on the right Incidentally on of the screws on his left clavicle repair is out of place.   Delight Ovens MD  Beeper [phone] Office [phone] 01/29/2013 5:18 PM

## 2013-01-29 NOTE — ED Notes (Signed)
PT fell in shower at [facility] yesterday and landed on right side of body.  Pt has reported some right rib pain and that he has a right collapsed lung from broken ribs

## 2013-01-30 ENCOUNTER — Inpatient Hospital Stay (HOSPITAL_COMMUNITY): Payer: Medicare Other

## 2013-01-30 ENCOUNTER — Encounter (HOSPITAL_COMMUNITY): Payer: Self-pay | Admitting: General Practice

## 2013-01-30 DIAGNOSIS — J9383 Other pneumothorax: Secondary | ICD-10-CM

## 2013-01-30 DIAGNOSIS — J930 Spontaneous tension pneumothorax: Secondary | ICD-10-CM

## 2013-01-30 DIAGNOSIS — J069 Acute upper respiratory infection, unspecified: Secondary | ICD-10-CM

## 2013-01-30 MED ORDER — DOCUSATE SODIUM 100 MG PO CAPS
100.0000 mg | ORAL_CAPSULE | Freq: Three times a day (TID) | ORAL | Status: DC
  Administered 2013-01-31 – 2013-02-05 (×17): 100 mg via ORAL
  Filled 2013-01-30 (×18): qty 1

## 2013-01-30 NOTE — Progress Notes (Signed)
Utilization Review Completed. 01/30/2013  

## 2013-01-30 NOTE — Progress Notes (Signed)
[facility] PROGRESS NOTE  WILLET SCHLEIFER JWJ:[id number] DOB: 09/15/1932 DOA: 01/29/2013 PCP: Berenda Morale, MD  Brief narrative: Brian Hoover is a 79 y.o. male with history of palpitations, documented PVCs and NSVT who underwent catheter ablation several years ago. Post procedure, his ventricular arrhythmias have resolved but his left ventricular function remains depressed. He has had one episode of syncope and a few falls. Yesterday, patient slipped in the shower at the [facility] and fell landing on right side of his back. He started experiencing excruciating right-sided back and anterior chest pain which was made worse with movement or deep breaths but was not associated with dyspnea. He denies passing out, head or neck injury or bleeding. He had difficulty getting up but eventually got up by himself and drove home. He took a dose of his wife's oxycodone with minimal relief. He had mild nausea but no vomiting. This morning, he kept up as scheduled appointment with Dr. Ladona Ridgel who advised him to followup with his PCP with a chest x-ray. Chest x-ray at the PCPs office apparently showed right rib fractures and pneumothorax and patient was advised to come to the ED immediately. In the ED, chest x-ray shows multiple mildly displaced right rib fractures (5-7) and large right-sided pneumothorax measuring approximately 50%. TCTS was consulted by EDP and have placed a right-sided chest tube and request hospitalist service to admit for further evaluation and management.   Assessment/Plan: Principal Problem:   Traumatic pneumothorax (Right) Management per thoracic surgery  Active Problems:   PREMATURE VENTRICULAR CONTRACTIONS, FREQUENT Follow in telemetry     Chronic systolic heart failure Currently stable    COPD Stable    Multiple rib fractures (Right) Pain reasonably controlled   Code Status: Full code Family Communication: None  Disposition Plan: Follow in step down until chest tube removed     Consultants:  CT surgery  Procedures:  Chest tube  Antibiotics:  None  DVT Prophylaxis:  Lovenox   HPI/Subjective: Patient alert-no complaints  Objective: Filed Vitals:   01/30/13 1135 01/30/13 1200 01/30/13 1218 01/30/13 1226  BP: 94/41 93/37 103/46 123/46  Pulse: 53 53  52  Temp: 98.4 F (36.9 C)     TempSrc: Oral     Resp: 15 15  28   Height:      Weight:      SpO2: 97% 98%  95%    Intake/Output Summary (Last 24 hours) at 01/30/13 1310 Last data filed at 01/30/13 0924  Gross per 24 hour  Intake   1285 ml  Output    650 ml  Net    635 ml    Exam:   General:  Awake and alert, no acute distress  Cardiovascular: Regular in rhythm no murmur  Respiratory: Clear to auscultation bilaterally  Abdomen: Soft nontender nondistended bowel sounds plus  Ext: No cyanosis clubbing or edema  Data Reviewed: Basic Metabolic Panel:  Recent Labs Lab 01/29/13 1604  NA 134*  K 4.9  CL 96  CO2 29  GLUCOSE 127*  BUN 28*  CREATININE 0.89  CALCIUM 10.0   Liver Function Tests: No results found for this basename: AST, ALT, ALKPHOS, BILITOT, PROT, ALBUMIN,  in the last 168 hours No results found for this basename: LIPASE, AMYLASE,  in the last 168 hours No results found for this basename: AMMONIA,  in the last 168 hours CBC:  Recent Labs Lab 01/29/13 1604  WBC 10.9*  NEUTROABS 9.3*  HGB 15.8  HCT 45.0  MCV 89.8  PLT 245  Cardiac Enzymes: No results found for this basename: CKTOTAL, CKMB, CKMBINDEX, TROPONINI,  in the last 168 hours BNP (last 3 results) No results found for this basename: PROBNP,  in the last 8760 hours CBG: No results found for this basename: GLUCAP,  in the last 168 hours  Recent Results (from the past 240 hour(s))  MRSA PCR SCREENING     Status: None   Collection Time    01/29/13  6:52 PM      Result Value Range Status   MRSA by PCR NEGATIVE  NEGATIVE Final   Comment:            The GeneXpert MRSA Assay (FDA      approved for NASAL specimens     only), is one component of a     comprehensive MRSA colonization     surveillance program. It is not     intended to diagnose MRSA     infection nor to guide or     monitor treatment for     MRSA infections.     Studies: Reviewed by attending   Scheduled Meds: . albuterol  2.5 mg Nebulization Q6H  . antiseptic oral rinse  15 mL Mouth Rinse BID  . aspirin EC  81 mg Oral Daily  . cholecalciferol  1,000 Units Oral Daily  . enoxaparin (LOVENOX) injection  40 mg Subcutaneous Q24H  . ipratropium  0.5 mg Nebulization Q6H  . lisinopril  20 mg Oral Daily  . multivitamin with minerals  1 tablet Oral Daily  . OLANZapine  10 mg Oral QHS  . sodium chloride  3 mL Intravenous Q12H  . valACYclovir  500 mg Oral Daily   Continuous Infusions:   ________________________________________________________________________  Time spent in minutes: 25    [facility]  [facility] Pager [phone] If 8PM-8AM, please contact night-coverage at [email or web address], password [facility] 01/30/2013, 1:10 PM  LOS: 1 day

## 2013-01-30 NOTE — Progress Notes (Signed)
Patient ID: Brian Hoover, male   DOB: 23-May-1932, 79 y.o.   MRN: [id number]                   [address]            [place] [address]          [phone]          LOS: 1 day   Subjective: Feels better today  Objective: Vital signs in last 24 hours: Patient Vitals for the past 24 hrs:  BP Temp Temp src Pulse Resp SpO2 Weight  01/30/13 0828 - - - - - 97 % -  01/30/13 0800 110/44 mmHg - - 57 19 97 % -  01/30/13 0748 101/39 mmHg 98.4 F (36.9 C) Oral 55 18 98 % -  01/30/13 0700 101/39 mmHg - - 53 14 99 % -  01/30/13 0500 100/46 mmHg - - 51 14 98 % -  01/30/13 0441 - - - - - - 171 lb 1.2 oz (77.6 kg)  01/30/13 0400 103/46 mmHg - - 51 14 - -  01/30/13 0335 110/45 mmHg 97.5 F (36.4 C) Axillary - - - -  01/30/13 0300 95/40 mmHg - - 49 14 - -  01/30/13 0220 - - - 51 13 98 % -  01/30/13 0200 104/45 mmHg - - 51 15 - -  01/30/13 0003 90/33 mmHg - - 55 7 - -  01/30/13 0000 - - - - - 96 % -  01/29/13 2300 98/38 mmHg 98.6 F (37 C) Oral 58 13 94 % 178 lb 9.2 oz (81 kg)  01/29/13 2200 114/44 mmHg - - 62 23 - -  01/29/13 2100 116/38 mmHg - - 57 16 - -  01/29/13 2057 - - - 58 15 97 % -  01/29/13 2005 121/57 mmHg 98.1 F (36.7 C) Oral 63 22 97 % -  01/29/13 1953 144/67 mmHg - - 64 - - -  01/29/13 1710 158/66 mmHg - - 64 24 100 % -  01/29/13 1700 144/66 mmHg - - 61 24 100 % -  01/29/13 1645 145/75 mmHg - - 65 21 100 % -  01/29/13 1500 152/77 mmHg 98.6 F (37 C) Oral 66 20 91 % -    Filed Weights   01/29/13 2300 01/30/13 0441  Weight: 178 lb 9.2 oz (81 kg) 171 lb 1.2 oz (77.6 kg)    Hemodynamic parameters for last 24 hours:    Intake/Output from previous day: 02/26 0701 - 02/27 0700 In: 915 [P.O.:240; I.V.:675] Out: 650 [Urine:550; Chest Tube:100] Intake/Output this shift: Total I/O In: 10 [I.V.:10] Out: -   Scheduled Meds: . sodium chloride   Intravenous STAT  . albuterol  2.5 mg Nebulization Q6H  . antiseptic oral rinse  15 mL Mouth Rinse BID  .  aspirin EC  81 mg Oral Daily  . cholecalciferol  1,000 Units Oral Daily  . enoxaparin (LOVENOX) injection  40 mg Subcutaneous Q24H  . ipratropium  0.5 mg Nebulization Q6H  . lisinopril  20 mg Oral Daily  . multivitamin with minerals  1 tablet Oral Daily  . OLANZapine  10 mg Oral QHS  . sodium chloride  3 mL Intravenous Q12H  . valACYclovir  500 mg Oral Daily   Continuous Infusions:  PRN Meds:.acetaminophen, acetaminophen, albuterol, HYDROcodone-acetaminophen, morphine injection, ondansetron (ZOFRAN) IV, ondansetron  General appearance: alert and cooperative Neurologic: intact Heart: regular rate and rhythm, S1, S2 normal, no murmur, click,  rub or gallop and normal apical impulse Lungs: clear to auscultation bilaterally  Lab Results: CBC: Recent Labs  01/29/13 1604  WBC 10.9*  HGB 15.8  HCT 45.0  PLT 245   BMET:  Recent Labs  01/29/13 1604  NA 134*  K 4.9  CL 96  CO2 29  GLUCOSE 127*  BUN 28*  CREATININE 0.89  CALCIUM 10.0    PT/INR:  Recent Labs  01/29/13 1604  LABPROT 12.3  INR 0.92     Radiology Dg Chest 2 View  01/29/2013  *RADIOLOGY REPORT*  Clinical Data: Follow up pneumothorax  CHEST - 2 VIEW  Comparison: 01/29/2013  Findings: There are multiple mildly displaced rib fractures identified.  This involves the right fifth, sixth and seventh ribs.  The patient has a large right-sided pneumothorax which measures approximately 50%.  This is slightly increased in volume from previous exam.  Heart size is normal.  No effusions or edema.  Left lung is clear. Prior ORIF of left clavicle fracture.  IMPRESSION:  1.  Right rib fractures. 2.  Increase in volume of right-sided pneumothorax.  Now approximately 50%.   Original Report Authenticated By: Signa Kell, M.D.    Dg Chest Port 1 View  01/30/2013  *RADIOLOGY REPORT*  Clinical Data: Postop check chest tube  PORTABLE CHEST - 1 VIEW  Comparison: Prior chest x-ray 01/29/2013  Findings: Stable position of right  thoracostomy tube.  No definite pneumothorax identified.  Improving bibasilar atelectasis. Slightly increased pulmonary vascular congestion without overt edema.  Right-sided rib fractures are unchanged.  No acute osseous abnormality.  IMPRESSION:  1.  Stable chest tube.  No definite pneumothorax identified on today's exam. 2.  Slightly increased pulmonary vascular congestion without overt edema. 2.  Similar to slightly improved bibasilar atelectasis.   Original Report Authenticated By: Malachy Moan, M.D.    Dg Chest Port 1 View  01/29/2013  *RADIOLOGY REPORT*  Clinical Data: 79 year old male with pneumothorax - status post right thoracostomy tube placement.  PORTABLE CHEST - 1 VIEW  Comparison: Films earlier this day and prior chest radiograph  Findings: .  There is been interval placement of a right thoracostomy tube.  A small residual right lateral basilar pneumothorax is noted - less than 5%. Right basilar atelectasis is present. Fractures of the right 5th - 9th ribs again noted. The cardiomediastinal silhouette is unremarkable. The left lung is clear.  IMPRESSION: Right thoracostomy tube placement with very small residual right lateral basilar pneumothorax - less than 5%.  Mild right basilar atelectasis.  Right rib fractures again noted.   Original Report Authenticated By: Harmon Pier, M.D.      Assessment/Plan: S/P  chest tube Ct to water seal today , poss remove tomorrow   Delight Ovens MD 01/30/2013 9:08 AM       Chest tube

## 2013-01-31 ENCOUNTER — Inpatient Hospital Stay (HOSPITAL_COMMUNITY): Payer: Medicare Other

## 2013-01-31 DIAGNOSIS — J930 Spontaneous tension pneumothorax: Secondary | ICD-10-CM

## 2013-01-31 DIAGNOSIS — I498 Other specified cardiac arrhythmias: Secondary | ICD-10-CM

## 2013-01-31 DIAGNOSIS — J449 Chronic obstructive pulmonary disease, unspecified: Secondary | ICD-10-CM

## 2013-01-31 LAB — BASIC METABOLIC PANEL
Calcium: 9 mg/dL (ref 8.4–10.5)
Creatinine, Ser: 0.7 mg/dL (ref 0.50–1.35)
GFR calc Af Amer: >90 mL/min (ref >90)

## 2013-01-31 MED ORDER — KETOROLAC TROMETHAMINE 15 MG/ML IJ SOLN
15.0000 mg | Freq: Three times a day (TID) | INTRAMUSCULAR | Status: AC | PRN
  Administered 2013-01-31 – 2013-02-03 (×7): 15 mg via INTRAVENOUS
  Filled 2013-01-31 (×8): qty 1

## 2013-01-31 MED ORDER — ALBUTEROL SULFATE HFA 108 (90 BASE) MCG/ACT IN AERS
2.0000 | INHALATION_SPRAY | RESPIRATORY_TRACT | Status: DC | PRN
  Filled 2013-01-31: qty 6.7

## 2013-01-31 MED ORDER — HYDROCODONE-ACETAMINOPHEN 5-325 MG PO TABS: 2.0000 | ORAL_TABLET | ORAL | Status: DC | PRN

## 2013-01-31 MED ORDER — BISACODYL 10 MG RE SUPP
10.0000 mg | Freq: Once | RECTAL | Status: AC
  Administered 2013-01-31: 10 mg via RECTAL
  Filled 2013-01-31: qty 1

## 2013-01-31 MED ORDER — POLYETHYLENE GLYCOL 3350 17 G PO PACK
17.0000 g | PACK | Freq: Every day | ORAL | Status: DC
  Administered 2013-01-31 – 2013-02-04 (×5): 17 g via ORAL
  Filled 2013-01-31 (×6): qty 1

## 2013-01-31 MED ORDER — HYDROCODONE-ACETAMINOPHEN 5-325 MG PO TABS
2.0000 | ORAL_TABLET | ORAL | Status: DC
  Administered 2013-01-31 – 2013-02-01 (×5): 2 via ORAL
  Filled 2013-01-31 (×6): qty 2

## 2013-01-31 NOTE — Progress Notes (Addendum)
[facility] PROGRESS NOTE  Brian Hoover ZOX:[id number] DOB: 1932/10/28 DOA: 01/29/2013 PCP: Berenda Morale, MD  Brief narrative: Brian Hoover is a 79 y.o. male with history of palpitations, documented PVCs and NSVT who underwent catheter ablation several years ago. Post procedure, his ventricular arrhythmias have resolved but his left ventricular function remains depressed. He has had one episode of syncope and a few falls. Yesterday, patient slipped in the shower at the [facility] and fell landing on right side of his back. He started experiencing excruciating right-sided back and anterior chest pain which was made worse with movement or deep breaths but was not associated with dyspnea. He denies passing out, head or neck injury or bleeding. He had difficulty getting up but eventually got up by himself and drove home. He took a dose of his wife's oxycodone with minimal relief. He had mild nausea but no vomiting. This morning, he kept up as scheduled appointment with Dr. Ladona Ridgel who advised him to followup with his PCP with a chest x-ray. Chest x-ray at the PCPs office apparently showed right rib fractures and pneumothorax and patient was advised to come to the ED immediately. In the ED, chest x-ray shows multiple mildly displaced right rib fractures (5-7) and large right-sided pneumothorax measuring approximately 50%. TCTS was consulted by EDP and have placed a right-sided chest tube and request hospitalist service to admit for further evaluation and management.   Assessment/Plan: Principal Problem:   Traumatic pneumothorax (Right) Management per thoracic surgery-chest tube has been removed today but apparently patient has had a significant amount of leakage of serosanguineous fluid from the chest tube site which we are monitoring.  Active Problems:   PREMATURE VENTRICULAR CONTRACTIONS, FREQUENT Follow in telemetry   Hyponatremia Sodium is noted to be trending downward-patient may be becoming  dehydrated or developing SIADH secondary to pain-will start normal saline at 50 cc an hour fter checking a urine sodium and osmolality-followup sodium in the morning    Chronic systolic heart failure Currently stable    COPD Stable    Multiple rib fractures (Right) Pain uncontrolled-Will change Vicodin to 2 tabs every 4 hours to be used routinely and add low-dose Toradol when necessary-start incentive spirometry as well and began to mobilize patient.   Code Status: Full code Family Communication: None  Disposition Plan: Follow in step down    Consultants:  CT surgery  Procedures:  Chest tube  Antibiotics:  None  DVT Prophylaxis:  Lovenox   HPI/Subjective: Patient alert-he states that pain is severe and despite being given multiple doses of pain medications through the night it remains uncontrolled. The patient, his wife and I have discussed further plans for pain control and are in agreement with above mentioned plan. He also admits to being constipated.  Objective: Filed Vitals:   01/31/13 0800 01/31/13 0832 01/31/13 0835 01/31/13 1244  BP: 98/47   94/37  Pulse:   57 59  Temp:   97.6 F (36.4 C) 98 F (36.7 C)  TempSrc:   Oral Oral  Resp:   14 17  Height:      Weight:      SpO2:  98%  95%    Intake/Output Summary (Last 24 hours) at 01/31/13 1442 Last data filed at 01/31/13 0700  Gross per 24 hour  Intake    300 ml  Output    840 ml  Net   -540 ml    Exam:   General:  Awake and alert, no acute distress  Cardiovascular: Regular  in rhythm no murmur  Respiratory: Clear to auscultation bilaterally  Abdomen: Soft nontender nondistended bowel sounds plus  Ext: No cyanosis clubbing or edema  Data Reviewed: Basic Metabolic Panel:  Recent Labs Lab 01/29/13 1604 01/31/13 0922  NA 134* 130*  K 4.9 4.2  CL 96 93*  CO2 29 29  GLUCOSE 127* 121*  BUN 28* 17  CREATININE 0.89 0.70  CALCIUM 10.0 9.0   Liver Function Tests: No results found for  this basename: AST, ALT, ALKPHOS, BILITOT, PROT, ALBUMIN,  in the last 168 hours No results found for this basename: LIPASE, AMYLASE,  in the last 168 hours No results found for this basename: AMMONIA,  in the last 168 hours CBC:  Recent Labs Lab 01/29/13 1604  WBC 10.9*  NEUTROABS 9.3*  HGB 15.8  HCT 45.0  MCV 89.8  PLT 245   Cardiac Enzymes: No results found for this basename: CKTOTAL, CKMB, CKMBINDEX, TROPONINI,  in the last 168 hours BNP (last 3 results) No results found for this basename: PROBNP,  in the last 8760 hours CBG: No results found for this basename: GLUCAP,  in the last 168 hours  Recent Results (from the past 240 hour(s))  MRSA PCR SCREENING     Status: None   Collection Time    01/29/13  6:52 PM      Result Value Range Status   MRSA by PCR NEGATIVE  NEGATIVE Final   Comment:            The GeneXpert MRSA Assay (FDA     approved for NASAL specimens     only), is one component of a     comprehensive MRSA colonization     surveillance program. It is not     intended to diagnose MRSA     infection nor to guide or     monitor treatment for     MRSA infections.     Studies: Reviewed by attending   Scheduled Meds: . albuterol  2.5 mg Nebulization Q6H  . antiseptic oral rinse  15 mL Mouth Rinse BID  . aspirin EC  81 mg Oral Daily  . bisacodyl  10 mg Rectal Once  . cholecalciferol  1,000 Units Oral Daily  . docusate sodium  100 mg Oral TID  . enoxaparin (LOVENOX) injection  40 mg Subcutaneous Q24H  . HYDROcodone-acetaminophen  2 tablet Oral Q4H  . ipratropium  0.5 mg Nebulization Q6H  . lisinopril  20 mg Oral Daily  . multivitamin with minerals  1 tablet Oral Daily  . OLANZapine  10 mg Oral QHS  . polyethylene glycol  17 g Oral Daily  . sodium chloride  3 mL Intravenous Q12H  . valACYclovir  500 mg Oral Daily   Continuous Infusions:   ________________________________________________________________________  Time spent in minutes:  25    [facility]  [facility] Pager [phone] If 8PM-8AM, please contact night-coverage at [email or web address], password [facility] 01/31/2013, 2:42 PM  LOS: 2 days

## 2013-01-31 NOTE — Progress Notes (Signed)
Right Flank CT removed per MD order per policy, patient tolerated well. 2600 RN to get portable c-xray post-removal.

## 2013-01-31 NOTE — Progress Notes (Addendum)
[address]            [place] [address]          [phone]          Subjective: Still quite sore, but breathing stable.   Objective: Vital signs in last 24 hours: Patient Vitals for the past 24 hrs:  BP Temp Temp src Pulse Resp SpO2 Weight  01/31/13 0835 - 97.6 F (36.4 C) Oral 57 14 - -  01/31/13 0832 - - - - - 98 % -  01/31/13 0800 98/47 mmHg - - - - - -  01/31/13 0700 105/44 mmHg - - 57 13 98 % -  01/31/13 0600 117/52 mmHg - - 58 17 97 % -  01/31/13 0500 100/43 mmHg - - 58 16 98 % 162 lb 4.1 oz (73.6 kg)  01/31/13 0400 89/52 mmHg 99 F (37.2 C) Oral 60 14 96 % -  01/31/13 0300 94/42 mmHg - - 60 15 - -  01/31/13 0200 91/43 mmHg - - 59 18 - -  01/31/13 0148 - - - - - 98 % -  01/31/13 0100 93/45 mmHg - - 59 13 - -  01/31/13 0000 99/44 mmHg 98.1 F (36.7 C) Oral 61 15 - -  01/30/13 2300 84/56 mmHg - - 62 15 - -  01/30/13 2200 91/57 mmHg - - 63 16 - -  01/30/13 2130 - - - - - 96 % -  01/30/13 2129 128/69 mmHg 98.4 F (36.9 C) Oral 65 27 - -  01/30/13 1900 123/52 mmHg - - 56 15 98 % -  01/30/13 1800 108/47 mmHg - - 51 15 97 % -  01/30/13 1700 112/41 mmHg - - 55 23 95 % -  01/30/13 1600 107/51 mmHg 98.2 F (36.8 C) Oral 55 15 98 % -  01/30/13 1500 114/44 mmHg - - 57 15 98 % -  01/30/13 1423 - - - - - 99 % -  01/30/13 1400 96/42 mmHg - - 55 19 97 % -  01/30/13 1300 - - - 59 17 97 % -  01/30/13 1226 123/46 mmHg - - 52 28 95 % -  01/30/13 1218 103/46 mmHg - - - - - -  01/30/13 1200 93/37 mmHg - - 53 15 98 % -  01/30/13 1135 94/41 mmHg 98.4 F (36.9 C) Oral 53 15 97 % -  01/30/13 1100 98/40 mmHg - - 53 15 97 % -  01/30/13 1039 122/44 mmHg - - - - - -  01/30/13 1000 89/44 mmHg - - 57 14 97 % -  01/30/13 0900 107/86 mmHg - - 60 28 98 % -   Current Weight  01/31/13 162 lb 4.1 oz (73.6 kg)     Intake/Output from previous day: 02/27 0701 - 02/28 0700 In: 910 [P.O.:900; I.V.:10] Out: 840 [Urine:600; Chest  Tube:240]    PHYSICAL EXAM:  Heart: RRR Lungs: Clear, though slightly decreased on R Chest tube: No air leak    Lab Results: CBC: Recent Labs  01/29/13 1604  WBC 10.9*  HGB 15.8  HCT 45.0  PLT 245   BMET:  Recent Labs  01/29/13 1604  NA 134*  K 4.9  CL 96  CO2 29  GLUCOSE 127*  BUN 28*  CREATININE 0.89  CALCIUM 10.0    PT/INR:  Recent Labs  01/29/13 1604  LABPROT  12.3  INR 0.92    CXR: Findings: Right-sided chest tube in place through a right  thoracotomy defect. No appreciable right pneumothorax. There is a  mild air space disease at the right lung base similar to prior.  Left lung is clear. Stable mildly enlarged heart silhouette.  IMPRESSION:  1. Postoperative change in the right hemithorax with chest tube  in place. No significant change.  2. No pneumothorax and right lower lobe air space disease.    Assessment/Plan: S/P  R CT for traumatic pneumothorax- Will plan to d/c CT today.  If CXR stable, could probably d/c home tomorrow from our standpoint. Other care per medical service.    LOS: 2 days    COLLINS,GINA H 01/31/2013 No air leak today, ct out today I have seen and examined Randa Evens and agree with the above assessment  and plan.  Delight Ovens MD Beeper [phone] Office [phone] 01/31/2013 9:17 AM

## 2013-02-01 ENCOUNTER — Inpatient Hospital Stay (HOSPITAL_COMMUNITY): Payer: Medicare Other

## 2013-02-01 LAB — BASIC METABOLIC PANEL
BUN: 29 mg/dL — ABNORMAL HIGH (ref 6–23)
Chloride: 96 mEq/L (ref 96–112)
Creatinine, Ser: 0.92 mg/dL (ref 0.50–1.35)
GFR calc Af Amer: >90 mL/min (ref >90)
GFR calc non Af Amer: 78 mL/min — ABNORMAL LOW (ref >90)
Glucose, Bld: 94 mg/dL (ref 70–99)

## 2013-02-01 LAB — SODIUM, URINE, RANDOM: Sodium, Ur: 22 mEq/L

## 2013-02-01 MED ORDER — HYDROCODONE-ACETAMINOPHEN 5-325 MG PO TABS
2.0000 | ORAL_TABLET | Freq: Four times a day (QID) | ORAL | Status: DC | PRN
  Administered 2013-02-01: 2 via ORAL

## 2013-02-01 MED ORDER — OXYCODONE HCL 5 MG PO TABS: 10.0000 mg | ORAL_TABLET | ORAL | Status: DC | PRN

## 2013-02-01 MED ORDER — OXYCODONE HCL 5 MG PO TABS
15.0000 mg | ORAL_TABLET | ORAL | Status: DC | PRN
Start: 2013-02-01 — End: 2013-02-03
  Administered 2013-02-01 – 2013-02-03 (×6): 15 mg via ORAL
  Filled 2013-02-01 (×6): qty 3

## 2013-02-01 MED ORDER — OXYCODONE HCL ER 10 MG PO T12A
20.0000 mg | EXTENDED_RELEASE_TABLET | Freq: Two times a day (BID) | ORAL | Status: DC
  Administered 2013-02-01 – 2013-02-02 (×4): 20 mg via ORAL
  Filled 2013-02-01 (×4): qty 2

## 2013-02-01 NOTE — Progress Notes (Signed)
Attempt to call report to nurse going to receive patient at 1630 and 1700 and nurse unable to take patient at this time.

## 2013-02-01 NOTE — Progress Notes (Addendum)
                    [address]            [place] [address]          [phone]          Subjective: Pt on bedside commode.  Weak, breathing stable. Wife very concerned because he hasn't walked.   Objective: Vital signs in last 24 hours: Patient Vitals for the past 24 hrs:  BP Temp Temp src Pulse Resp SpO2 Weight  02/01/13 0946 111/45 mmHg 98 F (36.7 C) Oral 61 20 95 % -  02/01/13 0354 110/51 mmHg 98.2 F (36.8 C) Oral 56 19 97 % 171 lb 4.8 oz (77.7 kg)  01/31/13 2352 107/52 mmHg 97.6 F (36.4 C) Axillary 59 16 97 % -  01/31/13 2013 - - - - - 97 % -  01/31/13 1951 104/47 mmHg 98.8 F (37.1 C) Oral - 24 96 % -  01/31/13 1700 112/49 mmHg - - 60 21 94 % -  01/31/13 1641 - - - - - 97 % -  01/31/13 1536 125/69 mmHg 98 F (36.7 C) Oral 70 20 95 % -  01/31/13 1244 94/37 mmHg 98 F (36.7 C) Oral 59 17 95 % -  01/31/13 1200 121/45 mmHg - - 60 27 96 % -   Current Weight  02/01/13 171 lb 4.8 oz (77.7 kg)     Intake/Output from previous day: 02/28 0701 - 03/01 0700 In: 663 [P.O.:580; I.V.:83] Out: 550 [Urine:550]    PHYSICAL EXAM:  Heart: RRR Lungs: slightly decreased in bases Wound: small amount serous drainage from CT site    Lab Results: CBC: Recent Labs  01/29/13 1604  WBC 10.9*  HGB 15.8  HCT 45.0  PLT 245   BMET:  Recent Labs  01/31/13 0922 02/01/13 0600  NA 130* 130*  K 4.2 4.7  CL 93* 96  CO2 29 28  GLUCOSE 121* 94  BUN 17 29*  CREATININE 0.70 0.92  CALCIUM 9.0 9.1    PT/INR:  Recent Labs  01/29/13 1604  LABPROT 12.3  INR 0.92    CXR: Findings: There is a residual small (approximately 10%) right apical pneumothorax following chest tube removal. In retrospect, this was probably present on yesterday's study and does not appear significantly changed. There is no left-sided pneumothorax or mediastinal shift. Bibasilar atelectasis is unchanged. Right- sided rib fractures are noted. Heart size and mediastinal contours  are stable.  IMPRESSION:  Stable small right apical pneumothorax following right chest tube removal. Underlying right rib fractures and bibasilar atelectasis noted.   Assessment/Plan: S/P  R CT for traumatic pneumothorax- CXR shows a small apical pneumothorax which is stable.   Continue current care per hospitalist service.   LOS: 3 days    COLLINS,GINA H 02/01/2013  I have seen and examined the patient and agree with the assessment and plan as outlined.  CXR stable w/ chest tube out.  Please call if we can be of further assistance.  Etheleen Valtierra H 02/01/2013 12:08 PM

## 2013-02-01 NOTE — Progress Notes (Signed)
Report called to terrie rn on 6n and patients wife carol was notifed of patient being transferred to 6n room 5.patient and his belongings including meds and chart are being taken to 6n.[facility] [employer]

## 2013-02-01 NOTE — Progress Notes (Signed)
[facility] PROGRESS NOTE  Brian Hoover ZOX:[id number] DOB: 1932/10/12 DOA: 01/29/2013 PCP: Berenda Morale, MD  Brief narrative: Brian Hoover is a 79 y.o. male with history of palpitations, documented PVCs and NSVT who underwent catheter ablation several years ago. Post procedure, his ventricular arrhythmias have resolved but his left ventricular function remains depressed. He has had one episode of syncope and a few falls. Yesterday, patient slipped in the shower at the [facility] and fell landing on right side of his back. He started experiencing excruciating right-sided back and anterior chest pain which was made worse with movement or deep breaths but was not associated with dyspnea. He denies passing out, head or neck injury or bleeding. He had difficulty getting up but eventually got up by himself and drove home. He took a dose of his wife's oxycodone with minimal relief. He had mild nausea but no vomiting. This morning, he kept up as scheduled appointment with Dr. Ladona Ridgel who advised him to followup with his PCP with a chest x-ray. Chest x-ray at the PCPs office apparently showed right rib fractures and pneumothorax and patient was advised to come to the ED immediately. In the ED, chest x-ray shows multiple mildly displaced right rib fractures (5-7) and large right-sided pneumothorax measuring approximately 50%. TCTS was consulted by EDP and have placed a right-sided chest tube and request hospitalist service to admit for further evaluation and management.   Assessment/Plan: Principal Problem:   Traumatic pneumothorax (Right) Management per thoracic surgery-chest tube has been removed today but apparently patient has had a significant amount of leakage of serosanguineous fluid from the chest tube site which has resolved  Active Problems:   Multiple rib fractures (Right) Pain remains uncontrolled- Started Oxycontin and Oxycodone today- as pt still requiring Dilaudid for breakthrough pain, will  increase Oxycodone to 15 for now until it is time for his evening dose of Oxycodone, which I will increase to 30 mg BID.  Cont to use Toradol when necessary-start incentive spirometry as well and began to mobilize patient.    PREMATURE VENTRICULAR CONTRACTIONS, FREQUENT No other significant rhythm abnormalities noted  Hyponatremia Sodium is noted to be trending downward-patient may be becoming dehydrated or developing SIADH secondary to pain--started normal saline at 50 cc an hour     Chronic systolic heart failure Currently stable    COPD Stable  Code Status: Full code Family Communication: None  Disposition Plan: home when pain controlled   Consultants:  CT surgery  Procedures:  Chest tube  Antibiotics:  None  DVT Prophylaxis:  Lovenox   HPI/Subjective: Patient alert-he states that pain is severe and despite being given multiple doses of pain medications through the night it remains uncontrolled. The patient, his wife and I have discussed further plans for pain control and are in agreement with above mentioned plan. He also admits to being constipated.  Objective: Filed Vitals:   01/31/13 2352 02/01/13 0354 02/01/13 0946 02/01/13 1137  BP: 107/52 110/51 111/45 106/70  Pulse: 59 56 61 64  Temp: 97.6 F (36.4 C) 98.2 F (36.8 C) 98 F (36.7 C) 98.3 F (36.8 C)  TempSrc: Axillary Oral Oral Oral  Resp: 16 19 20 21   Height:      Weight:  77.7 kg (171 lb 4.8 oz)    SpO2: 97% 97% 95% 93%    Intake/Output Summary (Last 24 hours) at 02/01/13 1547 Last data filed at 02/01/13 1431  Gross per 24 hour  Intake   1303 ml  Output  520 ml  Net    783 ml    Exam:   General:  Awake and alert, no acute distress  Cardiovascular: Regular in rhythm no murmur  Respiratory: Clear to auscultation bilaterally  Abdomen: Soft nontender nondistended bowel sounds plus  Ext: No cyanosis clubbing or edema  Data Reviewed: Basic Metabolic Panel:  Recent Labs Lab  01/29/13 1604 01/31/13 0922 02/01/13 0600  NA 134* 130* 130*  K 4.9 4.2 4.7  CL 96 93* 96  CO2 29 29 28   GLUCOSE 127* 121* 94  BUN 28* 17 29*  CREATININE 0.89 0.70 0.92  CALCIUM 10.0 9.0 9.1   Liver Function Tests: No results found for this basename: AST, ALT, ALKPHOS, BILITOT, PROT, ALBUMIN,  in the last 168 hours No results found for this basename: LIPASE, AMYLASE,  in the last 168 hours No results found for this basename: AMMONIA,  in the last 168 hours CBC:  Recent Labs Lab 01/29/13 1604  WBC 10.9*  NEUTROABS 9.3*  HGB 15.8  HCT 45.0  MCV 89.8  PLT 245   Cardiac Enzymes: No results found for this basename: CKTOTAL, CKMB, CKMBINDEX, TROPONINI,  in the last 168 hours BNP (last 3 results) No results found for this basename: PROBNP,  in the last 8760 hours CBG: No results found for this basename: GLUCAP,  in the last 168 hours  Recent Results (from the past 240 hour(s))  MRSA PCR SCREENING     Status: None   Collection Time    01/29/13  6:52 PM      Result Value Range Status   MRSA by PCR NEGATIVE  NEGATIVE Final   Comment:            The GeneXpert MRSA Assay (FDA     approved for NASAL specimens     only), is one component of a     comprehensive MRSA colonization     surveillance program. It is not     intended to diagnose MRSA     infection nor to guide or     monitor treatment for     MRSA infections.     Studies: Reviewed by attending   Scheduled Meds: . antiseptic oral rinse  15 mL Mouth Rinse BID  . aspirin EC  81 mg Oral Daily  . cholecalciferol  1,000 Units Oral Daily  . docusate sodium  100 mg Oral TID  . enoxaparin (LOVENOX) injection  40 mg Subcutaneous Q24H  . lisinopril  20 mg Oral Daily  . multivitamin with minerals  1 tablet Oral Daily  . OLANZapine  10 mg Oral QHS  . OxyCODONE  20 mg Oral Q12H  . polyethylene glycol  17 g Oral Daily  . sodium chloride  3 mL Intravenous Q12H  . valACYclovir  500 mg Oral Daily   Continuous  Infusions:   ________________________________________________________________________  Time spent in minutes: 25    [facility]  [facility] Pager [phone] If 8PM-8AM, please contact night-coverage at [email or web address], password [facility] 02/01/2013, 3:47 PM  LOS: 3 days

## 2013-02-02 ENCOUNTER — Inpatient Hospital Stay (HOSPITAL_COMMUNITY): Payer: Medicare Other

## 2013-02-02 NOTE — Progress Notes (Signed)
Pt ambulated with walker with rn  Pt tol  Very well

## 2013-02-02 NOTE — Progress Notes (Signed)
[facility] PROGRESS NOTE  NYEEM STOKE FAO:[id number] DOB: Jul 21, 1932 DOA: 01/29/2013 PCP: Berenda Morale, MD  Brief narrative: Brian Hoover is a 79 y.o. male with history of palpitations, documented PVCs and NSVT who underwent catheter ablation several years ago. Post procedure, his ventricular arrhythmias have resolved but his left ventricular function remains depressed. He has had one episode of syncope and a few falls. Yesterday, patient slipped in the shower at the [facility] and fell landing on right side of his back. He started experiencing excruciating right-sided back and anterior chest pain which was made worse with movement or deep breaths but was not associated with dyspnea. He denies passing out, head or neck injury or bleeding. He had difficulty getting up but eventually got up by himself and drove home. He took a dose of his wife's oxycodone with minimal relief. He had mild nausea but no vomiting. This morning, he kept up as scheduled appointment with Dr. Ladona Ridgel who advised him to followup with his PCP with a chest x-ray. Chest x-ray at the PCPs office apparently showed right rib fractures and pneumothorax and patient was advised to come to the ED immediately. In the ED, chest x-ray shows multiple mildly displaced right rib fractures (5-7) and large right-sided pneumothorax measuring approximately 50%. TCTS was consulted by EDP and have placed a right-sided chest tube and request hospitalist service to admit for further evaluation and management.   Assessment/Plan: Traumatic pneumothorax (Right) - patient had a chest tube placed on 01/29/13 by Dr. Jaynie Collins from thoracic surgery. - chest tube has been removed 3/1 2 subsequent chest x-ray revealed stability of the pneumothorax.    Multiple rib fractures (Right) Patient admitted for Pain control - placed initially on Dilaudid iv  On 02/01/13  Started on Oxycontin 20 every 12 hours  Cont to use Toradol when necessary-start incentive  spirometry as well and began to mobilize patient. Patient still with significant pain but having decreased ability to concentrate and function due to high dose narcotics. No plans to further increase the narcotics.  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     Chronic systolic heart failure Currently stable    COPD Stable  Constipation Patient has received MiraLAX and Colace on a daily basis in the hospital with fair results  Code Status: Full code Family Communication: Wife Disposition Plan: SNF versus home health   Consultants:  CT surgery  Procedures:  Chest tube    HPI/Subjective: Pain is a little bit better controlled but he feels like he has been drinking rum  Objective: Filed Vitals:   02/01/13 1610 02/01/13 1745 02/01/13 2204 02/02/13 0603  BP:  126/68 125/65 113/52  Pulse:  74 62 64  Temp:  98.6 F (37 C) 98.5 F (36.9 C) 97.9 F (36.6 C)  TempSrc:   Oral Oral  Resp:  18 18 17   Height:      Weight:    78.2 kg (172 lb 6.4 oz)  SpO2: 95% 94% 93% 100%    Intake/Output Summary (Last 24 hours) at 02/02/13 6962 Last data filed at 02/01/13 1431  Gross per 24 hour  Intake    480 ml  Output     70 ml  Net    410 ml    Exam:   General:  Awake and alert, no acute distress  Cardiovascular: Regular in rhythm no murmur  Respiratory: Clear to auscultation bilaterally  Abdomen: Soft nontender nondistended bowel sounds plus  Ext: No cyanosis clubbing or edema  Data Reviewed: Basic  Metabolic Panel:  Recent Labs Lab 01/29/13 1604 01/31/13 0922 02/01/13 0600  NA 134* 130* 130*  K 4.9 4.2 4.7  CL 96 93* 96  CO2 29 29 28   GLUCOSE 127* 121* 94  BUN 28* 17 29*  CREATININE 0.89 0.70 0.92  CALCIUM 10.0 9.0 9.1   Liver Function Tests: No results found for this basename: AST, ALT, ALKPHOS, BILITOT, PROT, ALBUMIN,  in the last 168 hours No results found for this basename: LIPASE, AMYLASE,  in the last 168 hours No results found for this  basename: AMMONIA,  in the last 168 hours CBC:  Recent Labs Lab 01/29/13 1604  WBC 10.9*  NEUTROABS 9.3*  HGB 15.8  HCT 45.0  MCV 89.8  PLT 245   Cardiac Enzymes: No results found for this basename: CKTOTAL, CKMB, CKMBINDEX, TROPONINI,  in the last 168 hours BNP (last 3 results) No results found for this basename: PROBNP,  in the last 8760 hours CBG: No results found for this basename: GLUCAP,  in the last 168 hours  Recent Results (from the past 240 hour(s))  MRSA PCR SCREENING     Status: None   Collection Time    01/29/13  6:52 PM      Result Value Range Status   MRSA by PCR NEGATIVE  NEGATIVE Final   Comment:            The GeneXpert MRSA Assay (FDA     approved for NASAL specimens     only), is one component of a     comprehensive MRSA colonization     surveillance program. It is not     intended to diagnose MRSA     infection nor to guide or     monitor treatment for     MRSA infections.     Studies: Reviewed by attending   Scheduled Meds: . antiseptic oral rinse  15 mL Mouth Rinse BID  . aspirin EC  81 mg Oral Daily  . cholecalciferol  1,000 Units Oral Daily  . docusate sodium  100 mg Oral TID  . enoxaparin (LOVENOX) injection  40 mg Subcutaneous Q24H  . lisinopril  20 mg Oral Daily  . multivitamin with minerals  1 tablet Oral Daily  . OLANZapine  10 mg Oral QHS  . OxyCODONE  20 mg Oral Q12H  . polyethylene glycol  17 g Oral Daily  . sodium chloride  3 mL Intravenous Q12H  . valACYclovir  500 mg Oral Daily   Continuous Infusions:   ________________________________________________________________________    [facility] Pager [phone] If 8PM-8AM, please contact night-coverage at [email or web address], password [facility] 02/02/2013, 9:27 AM  LOS: 4 days

## 2013-02-03 DIAGNOSIS — J930 Spontaneous tension pneumothorax: Secondary | ICD-10-CM

## 2013-02-03 LAB — BASIC METABOLIC PANEL
BUN: 32 mg/dL — ABNORMAL HIGH (ref 6–23)
CO2: 31 mEq/L (ref 19–32)
Calcium: 9 mg/dL (ref 8.4–10.5)
Creatinine, Ser: 0.92 mg/dL (ref 0.50–1.35)

## 2013-02-03 MED ORDER — MORPHINE SULFATE ER 30 MG PO TBCR
30.0000 mg | EXTENDED_RELEASE_TABLET | Freq: Three times a day (TID) | ORAL | Status: DC
  Administered 2013-02-03 – 2013-02-05 (×6): 30 mg via ORAL
  Filled 2013-02-03 (×6): qty 1

## 2013-02-03 MED ORDER — ACETAMINOPHEN 500 MG PO TABS
1000.0000 mg | ORAL_TABLET | Freq: Three times a day (TID) | ORAL | Status: DC
  Administered 2013-02-03 – 2013-02-05 (×7): 1000 mg via ORAL
  Filled 2013-02-03 (×9): qty 2

## 2013-02-03 MED ORDER — MORPHINE SULFATE 15 MG PO TABS
30.0000 mg | ORAL_TABLET | ORAL | Status: DC | PRN
  Administered 2013-02-03: 15 mg via ORAL
  Administered 2013-02-04 – 2013-02-05 (×4): 30 mg via ORAL
  Filled 2013-02-03: qty 1
  Filled 2013-02-03: qty 2
  Filled 2013-02-03 (×2): qty 1
  Filled 2013-02-03 (×3): qty 2

## 2013-02-03 MED ORDER — MORPHINE SULFATE 15 MG PO TABS
15.0000 mg | ORAL_TABLET | ORAL | Status: DC | PRN
  Administered 2013-02-03: 15 mg via ORAL
  Filled 2013-02-03: qty 1

## 2013-02-03 MED ORDER — SENNA 8.6 MG PO TABS
2.0000 | ORAL_TABLET | Freq: Two times a day (BID) | ORAL | Status: DC
  Administered 2013-02-03 – 2013-02-05 (×3): 17.2 mg via ORAL
  Filled 2013-02-03 (×3): qty 2
  Filled 2013-02-03: qty 1
  Filled 2013-02-03 (×3): qty 2

## 2013-02-03 MED ORDER — MORPHINE SULFATE ER 30 MG PO TBCR
30.0000 mg | EXTENDED_RELEASE_TABLET | Freq: Two times a day (BID) | ORAL | Status: DC
  Administered 2013-02-03: 30 mg via ORAL
  Filled 2013-02-03: qty 1

## 2013-02-03 NOTE — Evaluation (Signed)
Occupational Therapy Evaluation Patient Details Name: Brian Hoover MRN: [id number] DOB: 22-Apr-1932 Today's Date: 02/03/2013 Time: 1308-6578 OT Time Calculation (min): 33 min  OT Assessment / Plan / Recommendation Clinical Impression  Pt presents s/p fall w/ multiple rib fractures right and right pneumothorax. He is currently req assist w/ ADL's, functional mobility and transfers. Recommend acute OT followed by [facility] for home safety assessment. Pt wife reports that she has access to 3:1, which is recommended at this time.    OT Assessment  Patient needs continued OT Services    Follow Up Recommendations  Home health OT    Barriers to Discharge      Equipment Recommendations  Other (comment) (Pt's wife report she has access to 3:1, OT recommends this)    Recommendations for Other Services    Frequency  Min 3X/week    Precautions / Restrictions Precautions Precautions: Fall Restrictions Weight Bearing Restrictions: No   Pertinent Vitals/Pain Pt reports pain in ribs right side. Pt and pt's wife report that he was pre-medicated.    ADL  Grooming: Performed;Wash/dry hands;Wash/dry face;Set up Where Assessed - Grooming: Supported sitting Upper Body Bathing: Simulated;Set up;Supervision/safety Where Assessed - Upper Body Bathing: Supported sitting Lower Body Bathing: Simulated;Moderate assistance Where Assessed - Lower Body Bathing: Supported sitting;Supported sit to stand Upper Body Dressing: Simulated;Set up;Minimal assistance Where Assessed - Upper Body Dressing: Supported sitting Lower Body Dressing: Simulated;Moderate assistance Where Assessed - Lower Body Dressing: Supported sitting;Supported sit to stand Toilet Transfer: Performed;Minimal Dentist Method: Sit to Barista: Comfort height toilet;Grab bars Toileting - Clothing Manipulation and Hygiene: Performed;Min guard Where Assessed - Engineer, mining and Hygiene:  Sit to stand from 3-in-1 or toilet;Sit on 3-in-1 or toilet Tub/Shower Transfer Method: Not assessed Equipment Used: Gait belt;Rolling walker Transfers/Ambulation Related to ADLs: Pt requires Min guard assist for functional mobility and transfers, moves quickly, vc's, tc's for positioning and hand placement as well as safety ADL Comments: Pt presents s/p fall w/ multiple rib fractures and pneumothorax. He is currently req assist w/ ADL's, functional mobility and transfers. Recommend acute OT followed by [facility] for home safety assessment. Pt wife reports that she has access to 3:1, which is recommended at this time.    OT Diagnosis: Acute pain  OT Problem List: Decreased activity tolerance;Decreased knowledge of use of DME or AE;Pain OT Treatment Interventions: Self-care/ADL training;DME and/or AE instruction;Patient/family education;Therapeutic activities   OT Goals Acute Rehab OT Goals OT Goal Formulation: With patient Potential to Achieve Goals: Good ADL Goals Pt Will Perform Grooming: with set-up;Sitting, chair;Standing at sink ADL Goal: Grooming - Progress: Goal set today Pt Will Perform Upper Body Dressing: with supervision;with set-up;Sitting, chair;Sitting, bed ADL Goal: Upper Body Dressing - Progress: Goal set today Pt Will Perform Lower Body Dressing: with min assist;Sit to stand from bed;Sit to stand from chair;with adaptive equipment ADL Goal: Lower Body Dressing - Progress: Goal set today Pt Will Transfer to Toilet: with supervision ADL Goal: Toilet Transfer - Progress: Goal set today Pt Will Perform Toileting - Clothing Manipulation: Sitting on 3-in-1 or toilet;with supervision ADL Goal: Toileting - Clothing Manipulation - Progress: Goal set today Pt Will Perform Toileting - Hygiene: Sitting on 3-in-1 or toilet;with modified independence ADL Goal: Toileting - Hygiene - Progress: Goal set today Pt Will Perform Tub/Shower Transfer: Shower transfer;with supervision ADL Goal:  Tub/Shower Transfer - Progress: Goal set today  Visit Information  Last OT Received On: 02/03/13 Assistance Needed: +1    Subjective Data  Subjective: Pt reports feeling groggy from pain medication Patient Stated Goal: Home w/ PRN wife assist and therapy   Prior Functioning     Home Living Lives With: Spouse Available Help at Discharge: Family;Available PRN/intermittently Type of Home: House Home Access: Level entry Home Layout: One level Bathroom Shower/Tub: Tub/shower unit;Walk-in shower Bathroom Toilet: Handicapped height Bathroom Accessibility: Yes How Accessible: Accessible via walker Home Adaptive Equipment: Shower chair with back (Has hand held shower) Prior Function Level of Independence: Independent Able to Take Stairs?: Yes Driving: Yes Vocation: Retired Musician: No difficulties Dominant Hand: Right    Vision/Perception Vision - History Patient Visual Report: No change from baseline   Cognition  Cognition Overall Cognitive Status: Appears within functional limits for tasks assessed/performed Arousal/Alertness: Lethargic Orientation Level: Appears intact for tasks assessed;Oriented X4 / Intact Behavior During Session: [place] for tasks performed    Extremity/Trunk Assessment Right Upper Extremity Assessment RUE ROM/Strength/Tone: WFL for tasks assessed RUE Coordination: WFL - gross/fine motor Left Upper Extremity Assessment LUE ROM/Strength/Tone: WFL for tasks assessed LUE Coordination: WFL - gross/fine motor Right Lower Extremity Assessment RLE ROM/Strength/Tone: WFL for tasks assessed Left Lower Extremity Assessment LLE ROM/Strength/Tone: WFL for tasks assessed     Mobility Bed Mobility Bed Mobility: Rolling Left;Left Sidelying to Sit;Sitting - Scoot to Edge of Bed Rolling Left: 5: Supervision Left Sidelying to Sit: 5: Supervision Sitting - Scoot to Edge of Bed: 5: Supervision Details for Bed Mobility Assistance: bed mobility  very painful for pt at this time Transfers Transfers: Sit to Stand;Stand to Sit Sit to Stand: 4: Min assist;4: Min guard;From chair/3-in-1 Stand to Sit: 4: Min assist;4: Min guard;To chair/3-in-1 Details for Transfer Assistance: Vc's for safety, hand placement.      Balance Balance Balance Assessed: Yes High Level Balance High Level Balance Activites: Side stepping;Backward walking;Direction changes;Turns;Sudden stops;Head turns High Level Balance Comments: overall modest instability secondary to use of rw; will need continued education on proper use in tight spaces and navigating balance with obstacles   End of Session OT - End of Session Equipment Utilized During Treatment: Gait belt;Other (comment) (RW) Activity Tolerance: Patient tolerated treatment well Patient left: in chair;with call bell/phone within reach;with family/visitor present  GO     Roselie Awkward Dixon 02/03/2013, 12:45 PM

## 2013-02-03 NOTE — Progress Notes (Signed)
[facility] PROGRESS NOTE  Brian Hoover:[id number] DOB: 1932/05/28 DOA: 01/29/2013 PCP: Berenda Morale, MD  Brief narrative: Brian Hoover is a 79 y.o. male with history of palpitations, documented PVCs and NSVT who underwent catheter ablation several years ago. Post procedure, his ventricular arrhythmias have resolved but his left ventricular function remains depressed. He has had one episode of syncope and a few falls. Yesterday, patient slipped in the shower at the [facility] and fell landing on right side of his back. He started experiencing excruciating right-sided back and anterior chest pain which was made worse with movement or deep breaths but was not associated with dyspnea. He denies passing out, head or neck injury or bleeding. He had difficulty getting up but eventually got up by himself and drove home. He took a dose of his wife's oxycodone with minimal relief. He had mild nausea but no vomiting. This morning, he kept up as scheduled appointment with Dr. Ladona Ridgel who advised him to followup with his PCP with a chest x-ray. Chest x-ray at the PCPs office apparently showed right rib fractures and pneumothorax and patient was advised to come to the ED immediately. In the ED, chest x-ray shows multiple mildly displaced right rib fractures (5-7) and large right-sided pneumothorax measuring approximately 50%. TCTS was consulted by EDP and have placed a right-sided chest tube and request hospitalist service to admit for further evaluation and management.   Assessment/Plan: Traumatic pneumothorax (Right) - patient had a chest tube placed on 01/29/13 by Dr. Jaynie Collins from thoracic surgery. - chest tube has been removed 3/1  - 2 subsequent chest x-ray revealed stability of the pneumothorax.    Multiple rib fractures (Right) Patient admitted for Pain control - placed initially on Dilaudid iv  On 02/01/13  Started on Oxycontin 20 every 12 hours  Cont to use Toradol when necessary-start incentive  spirometry as well and began to mobilize patient. Patient still with significant pain but having decreased ability to concentrate and function due to high dose oxycodone On March 3 the wife has asked that we discontinue oxycodone due to the patient having difficulty concentrating and hallucinating through the night. Patient was placed on MS Contin 30 mg every 8 hours and breakthrough MSIR at 30 mg every 4 hours as needed.   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 Patient placed on Senokot around-the-clock on March 3  Code Status: Full code Family Communication: Wife Disposition Plan: home health    Consultants:  CT surgery  Procedures:  Chest tube    HPI/Subjective: Still with severe pain every time he moves  Objective: Filed Vitals:   02/02/13 1455 02/02/13 2218 02/03/13 0525 02/03/13 1435  BP: 106/55 116/64 105/63 124/55  Pulse: 58 58 59 66  Temp: 98.1 F (36.7 C) 98.8 F (37.1 C) 97.7 F (36.5 C) 97.8 F (36.6 C)  TempSrc: Oral Oral Oral Oral  Resp: 18 18 18 18   Height:      Weight:      SpO2: 94% 96% 93% 95%    Intake/Output Summary (Last 24 hours) at 02/03/13 1525 Last data filed at 02/03/13 1300  Gross per 24 hour  Intake   1200 ml  Output    800 ml  Net    400 ml    Exam:   General:  Awake and  Having difficulty concentrating  Cardiovascular: Regular rate and  Rhythm,  no murmur  Respiratory: Clear to auscultation bilaterally, but with significant difficulty taking deep breaths   Abdomen: Soft nontender nondistended bowel sounds diminished   Ext: No cyanosis clubbing or edema  Data Reviewed: Basic Metabolic Panel:  Recent Labs Lab 01/29/13 1604 01/31/13 0922 02/01/13 0600 02/03/13 0655  NA 134* 130* 130* 130*  K 4.9 4.2 4.7 4.5  CL 96 93* 96 95*  CO2 29  29 28 31   GLUCOSE 127* 121* 94 102*  BUN 28* 17 29* 32*  CREATININE 0.89 0.70 0.92 0.92  CALCIUM 10.0 9.0 9.1 9.0   Liver Function Tests: No results found for this basename: AST, ALT, ALKPHOS, BILITOT, PROT, ALBUMIN,  in the last 168 hours No results found for this basename: LIPASE, AMYLASE,  in the last 168 hours No results found for this basename: AMMONIA,  in the last 168 hours CBC:  Recent Labs Lab 01/29/13 1604  WBC 10.9*  NEUTROABS 9.3*  HGB 15.8  HCT 45.0  MCV 89.8  PLT 245   Cardiac Enzymes: No results found for this basename: CKTOTAL, CKMB, CKMBINDEX, TROPONINI,  in the last 168 hours BNP (last 3 results) No results found for this basename: PROBNP,  in the last 8760 hours CBG: No results found for this basename: GLUCAP,  in the last 168 hours  Recent Results (from the past 240 hour(s))  MRSA PCR SCREENING     Status: None   Collection Time    01/29/13  6:52 PM      Result Value Range Status   MRSA by PCR NEGATIVE  NEGATIVE Final   Comment:            The GeneXpert MRSA Assay (FDA     approved for NASAL specimens     only), is one component of a     comprehensive MRSA colonization     surveillance program. It is not     intended to diagnose MRSA     infection nor to guide or     monitor treatment for     MRSA infections.     Studies: Reviewed by attending   Scheduled Meds: . acetaminophen  1,000 mg Oral TID  . aspirin EC  81 mg Oral Daily  . cholecalciferol  1,000 Units Oral Daily  . docusate sodium  100 mg Oral TID  . enoxaparin (LOVENOX) injection  40 mg Subcutaneous Q24H  . lisinopril  20 mg Oral Daily  . morphine  30 mg Oral Q8H  . multivitamin with minerals  1 tablet Oral Daily  . OLANZapine  10 mg Oral QHS  . polyethylene glycol  17 g Oral Daily  . senna  2 tablet Oral BID  . sodium chloride  3 mL Intravenous Q12H  . valACYclovir  500 mg Oral Daily   Continuous Infusions:    ________________________________________________________________________    [facility] Pager [phone] If 8PM-8AM, please contact night-coverage at [email or web address], password [facility] 02/03/2013, 3:25 PM  LOS: 5 days

## 2013-02-03 NOTE — Progress Notes (Signed)
Patient ID: Brian Hoover, male   DOB: February 05, 1932, 79 y.o.   MRN: [id number]                   [address]            [place] [address]          [phone]          LOS: 5 days   Subjective: Breathing ok, still with rt rib pain  Objective: Vital signs in last 24 hours: Patient Vitals for the past 24 hrs:  BP Temp Temp src Pulse Resp SpO2  02/03/13 0525 105/63 mmHg 97.7 F (36.5 C) Oral 59 18 93 %  02/02/13 2218 116/64 mmHg 98.8 F (37.1 C) Oral 58 18 96 %  02/02/13 1455 106/55 mmHg 98.1 F (36.7 C) Oral 58 18 94 %    Filed Weights   01/31/13 0500 02/01/13 0354 02/02/13 0603  Weight: 162 lb 4.1 oz (73.6 kg) 171 lb 4.8 oz (77.7 kg) 172 lb 6.4 oz (78.2 kg)    Hemodynamic parameters for last 24 hours:    Intake/Output from previous day: 03/02 0701 - 03/03 0700 In: 480 [P.O.:480] Out: 450 [Urine:450] Intake/Output this shift: Total I/O In: 720 [P.O.:720] Out: 350 [Urine:350]  Scheduled Meds: . acetaminophen  1,000 mg Oral TID  . aspirin EC  81 mg Oral Daily  . cholecalciferol  1,000 Units Oral Daily  . docusate sodium  100 mg Oral TID  . enoxaparin (LOVENOX) injection  40 mg Subcutaneous Q24H  . lisinopril  20 mg Oral Daily  . morphine  30 mg Oral Q12H  . multivitamin with minerals  1 tablet Oral Daily  . OLANZapine  10 mg Oral QHS  . polyethylene glycol  17 g Oral Daily  . senna  2 tablet Oral BID  . sodium chloride  3 mL Intravenous Q12H  . valACYclovir  500 mg Oral Daily   Continuous Infusions:  PRN Meds:.albuterol, albuterol, morphine, morphine injection, ondansetron (ZOFRAN) IV, ondansetron  General appearance: alert, cooperative and mild distress Heart: regular rate and rhythm, S1, S2 normal, no murmur, click, rub or gallop and normal apical impulse Lungs: diminished breath sounds bibasilar Extremities: extremities normal, atraumatic, no cyanosis or edema and Homans sign is negative, no sign of DVT  Lab Results: CBC:No results  found for this basename: WBC, HGB, HCT, PLT,  in the last 72 hours BMET:  Recent Labs  02/01/13 0600 02/03/13 0655  NA 130* 130*  K 4.7 4.5  CL 96 95*  CO2 28 31  GLUCOSE 94 102*  BUN 29* 32*  CREATININE 0.92 0.92  CALCIUM 9.1 9.0    PT/INR: No results found for this basename: LABPROT, INR,  in the last 72 hours   Radiology [facility] 1 View  02/02/2013  *RADIOLOGY REPORT*  Clinical Data: Right pneumothorax.  PORTABLE CHEST - 1 VIEW  Comparison: 02/01/2013  Findings: Small right pneumothorax again noted, unchanged.  Right lower lobe atelectasis has slightly increased.  Small right pleural effusion is stable.  Multiple right-sided rib fractures noted. Mild cardiomegaly.  No confluent opacity on the left.  IMPRESSION: Stable right apical pneumothorax, small.  Increasing right base atelectasis.  Stable small right effusion.   Original Report Authenticated By: Charlett Nose, M.D.      Assessment/Plan: S/P  fall with rib fractures and PTX treated with chest tube now out Still with hard to control pain, not getting up or moving much Fu  chest xray in am Home when pain controlled  Delight Ovens MD 02/03/2013 2:13 PM

## 2013-02-03 NOTE — Evaluation (Signed)
Physical Therapy Evaluation Patient Details Name: Brian Hoover MRN: [id number] DOB: Apr 16, 1932 Today's Date: 02/03/2013 Time: 6578-4696 PT Time Calculation (min): 44 min  PT Assessment / Plan / Recommendation Clinical Impression  Pt is a very pleasent 79 y.o. male s/p pneumothorax and multiple rib fx from fall. Patient presents with some deficits in functional mobility secondary to pain, balance and decreased knowledge of DME use.  Feel patient will benefit from continued skilled PT acutely to address deficits and maximize function. Anticipate pt will progress well as pain becomes managed.  Current recommendations for d/c home with 24hr supervision intially and HHPT for balance and saftey in home.  Would encourage family to make arrangements for in home supervision initially 24 hrs if possible as SNF could potentially cause possible changes in mental status given patients age and level of medication.    PT Assessment  Patient needs continued PT services    Follow Up Recommendations  Home health PT;Supervision for mobility/OOB          Equipment Recommendations  Rolling walker with 5" wheels    Recommendations for Other Services OT consult   Frequency Min 4X/week    Precautions / Restrictions Precautions Precautions: Fall Restrictions Weight Bearing Restrictions: No   Pertinent Vitals/Pain 6/10 (right thoracic region)      Mobility  Bed Mobility Bed Mobility: Rolling Left;Left Sidelying to Sit;Sitting - Scoot to Edge of Bed Rolling Left: 5: Supervision Left Sidelying to Sit: 5: Supervision Sitting - Scoot to Edge of Bed: 5: Supervision Details for Bed Mobility Assistance: bed mobility very painful for pt at this time Transfers Transfers: Sit to Stand;Stand to Sit Sit to Stand: 4: Min guard (from multiple surfaces (bed,toilet, shower bench, chair)) Stand to Sit: 4: Min guard (to multiple surfaces (bed,toilet, shower bench, chair)) Details for Transfer Assistance: VC's for  hand placement; multiple cues and teach back education for proper hand placement Ambulation/Gait Ambulation/Gait Assistance: 5: Supervision;4: Min guard Ambulation Distance (Feet): 220 Feet Assistive device: Rolling walker Ambulation/Gait Assistance Details: VC's for body position in rw; VC's and education on proper use of rw with cues to no abandon rw.  Pt will need continued reinforcement Gait Pattern: Step-through pattern;Trunk flexed;Narrow base of support Gait velocity: decreased and guarded General Gait Details: patient overall steady with ambulation; no evidence of deconditioning at this time; pt guarded secondary to pain and requires VCs for upright posture Stairs: No    Exercises Other Exercises Other Exercises: provided general instruction in deep inspiratory breathing techniques and postures Other Exercises: reviewed use of incentive spirometer   PT Diagnosis: Difficulty walking;Acute pain  PT Problem List: Decreased activity tolerance;Decreased balance;Decreased mobility;Decreased knowledge of use of DME;Decreased safety awareness PT Treatment Interventions: DME instruction;Gait training;Functional mobility training;Balance training;Patient/family education   PT Goals Acute Rehab PT Goals PT Goal Formulation: With patient/family Time For Goal Achievement: 02/10/13 Potential to Achieve Goals: Good Pt will go Supine/Side to Sit: with modified independence PT Goal: Supine/Side to Sit - Progress: Goal set today Pt will go Sit to Supine/Side: with modified independence PT Goal: Sit to Supine/Side - Progress: Goal set today Pt will go Sit to Stand: with modified independence PT Goal: Sit to Stand - Progress: Goal set today Pt will go Stand to Sit: with modified independence PT Goal: Stand to Sit - Progress: Goal set today Pt will Ambulate: >150 feet;with modified independence;with rolling walker PT Goal: Ambulate - Progress: Goal set today  Visit Information  Last PT  Received On: 02/03/13 Assistance Needed: +1  Subjective Data  Subjective: It hurts the most when I am trying to get in and out of bed Patient Stated Goal: to decrease the pain   Prior Functioning  Home Living Lives With: Spouse Available Help at Discharge: Family;Available PRN/intermittently Type of Home: House Home Access: Level entry Home Layout: One level Bathroom Shower/Tub: Tub/shower unit;Walk-in shower Bathroom Toilet: Handicapped height Home Adaptive Equipment: Shower chair with back Prior Function Level of Independence: Independent Able to Take Stairs?: Yes Driving: Yes Vocation: Retired Musician: No difficulties Dominant Hand: Right    Cognition  Cognition Overall Cognitive Status: Appears within functional limits for tasks assessed/performed Arousal/Alertness: Lethargic Orientation Level: Appears intact for tasks assessed;Oriented X4 / Intact Behavior During Session: [facility] for tasks performed    Extremity/Trunk Assessment Right Upper Extremity Assessment RUE ROM/Strength/Tone: [facility] for tasks assessed Left Upper Extremity Assessment LUE ROM/Strength/Tone: WFL for tasks assessed Right Lower Extremity Assessment RLE ROM/Strength/Tone: [facility] for tasks assessed Left Lower Extremity Assessment LLE ROM/Strength/Tone: WFL for tasks assessed   Balance Balance Balance Assessed: Yes High Level Balance High Level Balance Activites: Side stepping;Backward walking;Direction changes;Turns;Sudden stops;Head turns High Level Balance Comments: overall modest instability secondary to use of rw; will need continued education on proper use in tight spaces and navigating balance with obstacles  End of Session PT - End of Session Equipment Utilized During Treatment: Gait belt Activity Tolerance: Patient tolerated treatment well;Patient limited by pain Patient left: in chair;with call bell/phone within reach;with family/visitor present  GP     Fabio Asa 02/03/2013, 12:06 PM  Charlotte Crumb, PT DPT  [phone]

## 2013-02-04 ENCOUNTER — Inpatient Hospital Stay (HOSPITAL_COMMUNITY): Payer: Medicare Other

## 2013-02-04 DIAGNOSIS — K5900 Constipation, unspecified: Secondary | ICD-10-CM | POA: Diagnosis not present

## 2013-02-04 MED ORDER — POLYETHYLENE GLYCOL 3350 17 G PO PACK
17.0000 g | PACK | Freq: Two times a day (BID) | ORAL | Status: DC
  Administered 2013-02-05: 17 g via ORAL
  Filled 2013-02-04 (×3): qty 1

## 2013-02-04 MED ORDER — MAGNESIUM CITRATE PO SOLN
1.0000 | Freq: Once | ORAL | Status: AC
  Administered 2013-02-04: 1 via ORAL
  Filled 2013-02-04: qty 296

## 2013-02-04 MED ORDER — SORBITOL 70 % SOLN
960.0000 mL | TOPICAL_OIL | Freq: Once | ORAL | Status: AC
  Administered 2013-02-04: 960 mL via RECTAL
  Filled 2013-02-04: qty 240

## 2013-02-04 MED ORDER — BISACODYL 5 MG PO TBEC: 5.0000 mg | DELAYED_RELEASE_TABLET | Freq: Every day | ORAL | Status: DC | PRN

## 2013-02-04 NOTE — Progress Notes (Signed)
[facility] PROGRESS NOTE  Brian Hoover ZOX:[id number] DOB: 1932/09/27 DOA: 01/29/2013 PCP: Berenda Morale, MD  Brief narrative: Brian Hoover is a 78 y.o. male with history of palpitations, documented PVCs and NSVT who underwent catheter ablation several years ago. Post procedure, his ventricular arrhythmias have resolved but his left ventricular function remains depressed. He has had one episode of syncope and a few falls. Yesterday, patient slipped in the shower at the [facility] and fell landing on right side of his back. He started experiencing excruciating right-sided back and anterior chest pain which was made worse with movement or deep breaths but was not associated with dyspnea. He denies passing out, head or neck injury or bleeding. He had difficulty getting up but eventually got up by himself and drove home. He took a dose of his wife's oxycodone with minimal relief. He had mild nausea but no vomiting. This morning, he kept up as scheduled appointment with Dr. Ladona Ridgel who advised him to followup with his PCP with a chest x-ray. Chest x-ray at the PCPs office apparently showed right rib fractures and pneumothorax and patient was advised to come to the ED immediately. In the ED, chest x-ray shows multiple mildly displaced right rib fractures (5-7) and large right-sided pneumothorax measuring approximately 50%. TCTS was consulted by EDP and have placed a right-sided chest tube and request hospitalist service to admit for further evaluation and management.   Assessment/Plan: Traumatic pneumothorax (Right) - Patient had a chest tube placed on 01/29/13 by Dr. Jaynie Collins from thoracic surgery. - Chest tube has been removed 3/1 - 2 subsequent chest x-ray revealed stability of the pneumothorax.  Multiple rib fractures (Right) - Patient admitted for pain control. - Placed initially on Dilaudid iv, on 02/01/13  Started on Oxycontin 20 every 12 hours. Also was on Toradol, which has been discontinued.  Continue incentive spirometry as well and began to mobilize patient with PT. - Patient still with significant pain but having decreased ability to concentrate and function due to high dose oxycodone. - On March 3 the wife has asked that we discontinue oxycodone due to the patient having difficulty concentrating and hallucinating through the night. - Patient was placed on MS Contin 30 mg every 8 hours and breakthrough MSIR at 30 mg every 4 hours as needed. Continues to have hallucinations at times, continue current regimen.  Acute delirium/Acute encephalopathy Due to pain medications, continue to monitor.  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 Stable.  Chronic systolic heart failure Currently stable/compensated.  COPD Stable.  Constipation Continue MiraLAX (increase to BID). Continue Colace and Senna. Give a bottle of magnesium citrate and SMOG enema. Last bowel movement was on 01/31/2013.  Code Status: Full code Family Communication: Wife at bedside. Disposition Plan: home with home health at the time of discharge.  Consultants:  CT surgery  Procedures:  Chest tube  HPI/Subjective: Still having pain. Reports having hallucinations with current pain medications. Dosing in and out.  Objective: Filed Vitals:   02/03/13 0525 02/03/13 1435 02/03/13 2142 02/04/13 0508  BP: 105/63 124/55 122/53 104/59  Pulse: 59 66 66 55  Temp: 97.7 F (36.5 C) 97.8 F (36.6 C) 97.6 F (36.4 C) 98.1 F (36.7 C)  TempSrc: Oral Oral Oral   Resp: 18 18 18 16   Height:      Weight:    77.9 kg (171 lb 11.8 oz)  SpO2: 93% 95% 96% 95%    Intake/Output Summary (Last 24 hours) at 02/04/13 1227 Last data filed at 02/04/13 0900  Gross per 24 hour  Intake    720 ml  Output    650 ml  Net     70 ml    Exam: Physical Exam: General: Awake but somnolent at times but easily arousable, Oriented, No acute distress. HEENT: EOMI. Neck: Supple CV: S1 and S2 Lungs: Clear to ascultation  bilaterally Abdomen: Soft, Nontender, Nondistended, +bowel sounds.  Ext: Good pulses. Trace edema.  Data Reviewed: Basic Metabolic Panel:  Recent Labs Lab 01/29/13 1604 01/31/13 0922 02/01/13 0600 02/03/13 0655  NA 134* 130* 130* 130*  K 4.9 4.2 4.7 4.5  CL 96 93* 96 95*  CO2 29 29 28 31   GLUCOSE 127* 121* 94 102*  BUN 28* 17 29* 32*  CREATININE 0.89 0.70 0.92 0.92  CALCIUM 10.0 9.0 9.1 9.0   Liver Function Tests: No results found for this basename: AST, ALT, ALKPHOS, BILITOT, PROT, ALBUMIN,  in the last 168 hours No results found for this basename: LIPASE, AMYLASE,  in the last 168 hours No results found for this basename: AMMONIA,  in the last 168 hours CBC:  Recent Labs Lab 01/29/13 1604  WBC 10.9*  NEUTROABS 9.3*  HGB 15.8  HCT 45.0  MCV 89.8  PLT 245   Cardiac Enzymes: No results found for this basename: CKTOTAL, CKMB, CKMBINDEX, TROPONINI,  in the last 168 hours BNP (last 3 results) No results found for this basename: PROBNP,  in the last 8760 hours CBG: No results found for this basename: GLUCAP,  in the last 168 hours  Recent Results (from the past 240 hour(s))  MRSA PCR SCREENING     Status: None   Collection Time    01/29/13  6:52 PM      Result Value Range Status   MRSA by PCR NEGATIVE  NEGATIVE Final   Comment:            The GeneXpert MRSA Assay (FDA     approved for NASAL specimens     only), is one component of a     comprehensive MRSA colonization     surveillance program. It is not     intended to diagnose MRSA     infection nor to guide or     monitor treatment for     MRSA infections.     Studies: Reviewed by attending   Scheduled Meds: . acetaminophen  1,000 mg Oral TID  . aspirin EC  81 mg Oral Daily  . cholecalciferol  1,000 Units Oral Daily  . docusate sodium  100 mg Oral TID  . enoxaparin (LOVENOX) injection  40 mg Subcutaneous Q24H  . lisinopril  20 mg Oral Daily  . morphine  30 mg Oral Q8H  . multivitamin with  minerals  1 tablet Oral Daily  . OLANZapine  10 mg Oral QHS  . polyethylene glycol  17 g Oral Daily  . senna  2 tablet Oral BID  . sodium chloride  3 mL Intravenous Q12H  . valACYclovir  500 mg Oral Daily   Continuous Infusions:   ________________________________________________________________________    Cristal Ford, MD [facility] Pager [phone] If 7PM-7AM, please contact night-coverage at [email or web address], password [facility] 02/04/2013, 12:27 PM  LOS: 6 days

## 2013-02-04 NOTE — Clinical Social Work Note (Signed)
Clinical Social Worker received inappropriate consult for SNF placement. PT evaluated patient and recommended home health at discharge. CSW will sign off, as social work intervention is no longer needed.   Rozetta Nunnery MSW, [employer] [phone]

## 2013-02-04 NOTE — Progress Notes (Addendum)
Subjective:  Brian Hoover continues to complain of pain.    Objective: Vital signs in last 24 hours: Temp:  [97.6 F (36.4 C)-98.1 F (36.7 C)] 98.1 F (36.7 C) (03/04 0508) Pulse Rate:  [55-66] 55 (03/04 0508) Cardiac Rhythm:  [-]  Resp:  [16-18] 16 (03/04 0508) BP: (104-124)/(53-59) 104/59 mmHg (03/04 0508) SpO2:  [95 %-96 %] 95 % (03/04 0508) Weight:  [171 lb 11.8 oz (77.9 kg)] 171 lb 11.8 oz (77.9 kg) (03/04 0508)  Intake/Output from previous day: 03/03 0701 - 03/04 0700 In: 720 [P.O.:720] Out: 650 [Urine:650]  General appearance: alert, cooperative and no distress Heart: regular rate and rhythm Lungs: clear to auscultation bilaterally Abdomen: soft, non-tender; bowel sounds normal; no masses,  no organomegaly Wound: clean and dry  Lab Results: No results found for this basename: WBC, HGB, HCT, PLT,  in the last 72 hours BMET:  Recent Labs  02/03/13 0655  NA 130*  K 4.5  CL 95*  CO2 31  GLUCOSE 102*  BUN 32*  CREATININE 0.92  CALCIUM 9.0    PT/INR: No results found for this basename: LABPROT, INR,  in the last 72 hours ABG No results found for this basename: phart, pco2, po2, hco3, tco2, acidbasedef, o2sat   CBG (last 3)  No results found for this basename: GLUCAP,  in the last 72 hours  Assessment/Plan:  1. Chest tube out- CXR with tiny apical right pneumothorax  2. Will see patient in office in 2 weeks with follow up CXR   LOS: 6 days    BARRETT, ERIN 02/04/2013  I have seen and examined Brian Hoover and agree with the above assessment  and plan.  Delight Ovens MD Beeper [phone] Office [phone] 02/04/2013 12:23 PM

## 2013-02-04 NOTE — Progress Notes (Signed)
SMOG given with fair results

## 2013-02-05 DIAGNOSIS — K5900 Constipation, unspecified: Secondary | ICD-10-CM

## 2013-02-05 MED ORDER — SENNA 8.6 MG PO TABS: 2.0000 | ORAL_TABLET | Freq: Two times a day (BID) | ORAL | Status: DC

## 2013-02-05 MED ORDER — DSS 100 MG PO CAPS: 100.0000 mg | ORAL_CAPSULE | Freq: Three times a day (TID) | ORAL | Status: DC

## 2013-02-05 MED ORDER — MORPHINE SULFATE 30 MG PO TABS: 30.0000 mg | ORAL_TABLET | ORAL | Status: DC | PRN

## 2013-02-05 MED ORDER — MORPHINE SULFATE ER 30 MG PO TBCR: 30.0000 mg | EXTENDED_RELEASE_TABLET | Freq: Three times a day (TID) | ORAL | Status: DC

## 2013-02-05 MED ORDER — POLYETHYLENE GLYCOL 3350 17 G PO PACK: 17.0000 g | PACK | Freq: Two times a day (BID) | ORAL | Status: DC

## 2013-02-05 NOTE — Progress Notes (Signed)
Physical Therapy Treatment Patient Details Name: Brian Hoover MRN: [id number] DOB: [birth year] Today's Date: 02/05/2013 Time: 1139-1203 PT Time Calculation (min): 24 min  PT Assessment / Plan / Recommendation Comments on Treatment Session  Pt continues to demonstrate good overall mobility with some modest concerns for safety using DME. Anticipate patient will do better once home in his usual environment. Spoke with spouse at length regarding acute delirium and techniques to assist with reorientation should this become an issue at home. Instructed pt and spouse regarding car transfers and home navigation with safe techniques. Pt and spouse appreciative.    Follow Up Recommendations  Home health PT                 Frequency Min 4X/week   Plan Discharge plan remains appropriate    Precautions / Restrictions Precautions Precautions: Fall Restrictions Weight Bearing Restrictions: No   Pertinent Vitals/Pain 4/10    Mobility  Bed Mobility Bed Mobility: Rolling Right;Right Sidelying to Sit;Sitting - Scoot to Edge of Bed Rolling Right: 5: Supervision Right Sidelying to Sit: 5: Supervision Sitting - Scoot to Edge of Bed: 5: Supervision Details for Bed Mobility Assistance: Bed mobility becoming less painful. Transfers Transfers: Sit to Stand;Stand to Sit Sit to Stand: 5: Supervision;With armrests Stand to Sit: 5: Supervision;To chair/3-in-1 Details for Transfer Assistance: Vc's for safety, hand placement. pt required 3 attempts to stand with initial standing. Ambulation/Gait Ambulation/Gait Assistance: 5: Supervision Ambulation Distance (Feet): 400 Feet Assistive device: Rolling walker Ambulation/Gait Assistance Details: Step-through pattern;Decreased stride length;Trunk flexed;Narrow base of support Gait Pattern: Step-through pattern;Decreased stride length;Trunk flexed;Narrow base of support     PT Goals Acute Rehab PT Goals PT Goal: Supine/Side to Sit - Progress: Progressing  toward goal PT Goal: Sit to Stand - Progress: Progressing toward goal PT Goal: Stand to Sit - Progress: Progressing toward goal PT Goal: Ambulate - Progress: Progressing toward goal  Visit Information  Last PT Received On: 02/05/13 Assistance Needed: +1 PT/OT Co-Evaluation/Treatment: Yes    Subjective Data   "I will have a bigger TV to watch at home"   Cognition  Cognition Overall Cognitive Status: Appears within functional limits for tasks assessed/performed Arousal/Alertness: Awake/alert Orientation Level: Oriented X4 / Intact Behavior During Session: [facility] for tasks performed Cognition - Other Comments: mild confusion at times when responding to questions but easily reoriented to the question being asked.  Delierum seems better today.    Balance  Balance Balance Assessed: Yes High Level Balance High Level Balance Activites: Side stepping;Backward walking;Direction changes;Turns;Sudden stops;Head turns High Level Balance Comments: still requires some VC's for safety with rw  End of Session PT - End of Session Equipment Utilized During Treatment: Gait belt Activity Tolerance: Patient tolerated treatment well Patient left: in chair;with call bell/phone within reach;with family/visitor present Nurse Communication: Mobility status   GP     Fabio Asa 02/05/2013, 1:17 PM Charlotte Crumb, PT DPT  [phone]

## 2013-02-05 NOTE — Progress Notes (Signed)
Discharged to home

## 2013-02-05 NOTE — Progress Notes (Signed)
HHPT/OT arranged with [facility] per pt choice. Address and phone (both home and mobile) in EPIC are correct. No DME needs.

## 2013-02-05 NOTE — Progress Notes (Signed)
[facility] PROGRESS NOTE  Brian Hoover ZOX:[id number] DOB: 12/09/1931 DOA: 01/29/2013 PCP: Berenda Morale, MD  Brief narrative: Brian Hoover is a 79 y.o. male with history of palpitations, documented PVCs and NSVT who underwent catheter ablation several years ago. Post procedure, his ventricular arrhythmias have resolved but his left ventricular function remains depressed. He has had one episode of syncope and a few falls. Yesterday, patient slipped in the shower at the [facility] and fell landing on right side of his back. He started experiencing excruciating right-sided back and anterior chest pain which was made worse with movement or deep breaths but was not associated with dyspnea. He denies passing out, head or neck injury or bleeding. He had difficulty getting up but eventually got up by himself and drove home. He took a dose of his wife's oxycodone with minimal relief. He had mild nausea but no vomiting. This morning, he kept up as scheduled appointment with Dr. Ladona Ridgel who advised him to followup with his PCP with a chest x-ray. Chest x-ray at the PCPs office apparently showed right rib fractures and pneumothorax and patient was advised to come to the ED immediately. In the ED, chest x-ray shows multiple mildly displaced right rib fractures (5-7) and large right-sided pneumothorax measuring approximately 50%. TCTS was consulted by EDP and have placed a right-sided chest tube and request hospitalist service to admit for further evaluation and management.   Assessment/Plan: Traumatic pneumothorax (Right) - Patient had a chest tube placed on 01/29/13 by Dr. Jaynie Collins from thoracic surgery. - Chest tube has been removed 3/1 - 2 subsequent chest x-ray revealed stability of the pneumothorax.  Multiple rib fractures (Right) - Patient admitted for pain control. - Placed initially on Dilaudid iv, on 02/01/13  Started on Oxycontin 20 every 12 hours. Also was on Toradol, which has been discontinued.  Continue incentive spirometry as well and began to mobilize patient with PT. - Patient still with significant pain but having decreased ability to concentrate and function due to high dose oxycodone. - On 02/03/2013 3 the wife has asked that we discontinue oxycodone due to the patient having difficulty concentrating and hallucinating through the night. - Patient was placed on MS Contin 30 mg every 8 hours and breakthrough MSIR at 30 mg every 4 hours as needed. Continues to have hallucinations at times, continue current regimen. - Discussed with the patient and wife that at home if pain improves to wean MSIR to q8h then to wean MS Contin to q12h. If they are uncomfortable with weaning the patient off pain medications then to leave as is and to discuss with pain medications with his PCP. They expressed understanding.  Acute delirium/Acute encephalopathy Due to pain medications, continue to monitor.  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 Stable.  Chronic systolic heart failure Currently stable/compensated.  COPD Stable.  Constipation Continue MiraLAX (increase to BID). Continue Colace and Senna. Give a bottle of magnesium citrate and SMOG enema, with good results. Last bowel movement was on 02/04/2013.  Code Status: Full code Family Communication: Wife at bedside. Disposition Plan: home with home health today.   Consultants:  CT surgery  Procedures:  Chest tube  HPI/Subjective: Still having pain. Reports having hallucinations with current pain medications. Dosing in and out.  Objective: Filed Vitals:   02/04/13 1405 02/04/13 1752 02/04/13 2205 02/05/13 0525  BP: 110/46 104/49 94/63 100/70  Pulse: 61 64 63 59  Temp: 98.4 F (36.9 C) 98.4 F (36.9 C) 97.8 F (36.6 C) 97.6 F (36.4  C)  TempSrc: Oral Oral Oral Oral  Resp: 18 18 16 18   Height:      Weight:    78 kg (171 lb 15.3 oz)  SpO2: 100% 92% 95% 98%    Intake/Output Summary (Last 24 hours) at 02/05/13 1105 Last  data filed at 02/05/13 0525  Gross per 24 hour  Intake    440 ml  Output    300 ml  Net    140 ml    Exam: Physical Exam: General: Awake, Oriented, No acute distress. HEENT: EOMI. Neck: Supple CV: S1 and S2 Lungs: Clear to ascultation bilaterally Abdomen: Soft, Nontender, Nondistended, +bowel sounds.  Ext: Good pulses. Trace edema.  Data Reviewed: Basic Metabolic Panel:  Recent Labs Lab 01/29/13 1604 01/31/13 0922 02/01/13 0600 02/03/13 0655  NA 134* 130* 130* 130*  K 4.9 4.2 4.7 4.5  CL 96 93* 96 95*  CO2 29 29 28 31   GLUCOSE 127* 121* 94 102*  BUN 28* 17 29* 32*  CREATININE 0.89 0.70 0.92 0.92  CALCIUM 10.0 9.0 9.1 9.0   Liver Function Tests: No results found for this basename: AST, ALT, ALKPHOS, BILITOT, PROT, ALBUMIN,  in the last 168 hours No results found for this basename: LIPASE, AMYLASE,  in the last 168 hours No results found for this basename: AMMONIA,  in the last 168 hours CBC:  Recent Labs Lab 01/29/13 1604  WBC 10.9*  NEUTROABS 9.3*  HGB 15.8  HCT 45.0  MCV 89.8  PLT 245   Cardiac Enzymes: No results found for this basename: CKTOTAL, CKMB, CKMBINDEX, TROPONINI,  in the last 168 hours BNP (last 3 results) No results found for this basename: PROBNP,  in the last 8760 hours CBG: No results found for this basename: GLUCAP,  in the last 168 hours  Recent Results (from the past 240 hour(s))  MRSA PCR SCREENING     Status: None   Collection Time    01/29/13  6:52 PM      Result Value Range Status   MRSA by PCR NEGATIVE  NEGATIVE Final   Comment:            The GeneXpert MRSA Assay (FDA     approved for NASAL specimens     only), is one component of a     comprehensive MRSA colonization     surveillance program. It is not     intended to diagnose MRSA     infection nor to guide or     monitor treatment for     MRSA infections.     Studies: Reviewed by attending   Scheduled Meds: . acetaminophen  1,000 mg Oral TID  . aspirin EC   81 mg Oral Daily  . cholecalciferol  1,000 Units Oral Daily  . docusate sodium  100 mg Oral TID  . enoxaparin (LOVENOX) injection  40 mg Subcutaneous Q24H  . lisinopril  20 mg Oral Daily  . morphine  30 mg Oral Q8H  . multivitamin with minerals  1 tablet Oral Daily  . OLANZapine  10 mg Oral QHS  . polyethylene glycol  17 g Oral BID  . senna  2 tablet Oral BID  . sodium chloride  3 mL Intravenous Q12H  . valACYclovir  500 mg Oral Daily   Continuous Infusions:   ________________________________________________________________________    Cristal Ford, MD [facility] Pager [phone] If 7PM-7AM, please contact night-coverage at [email or web address], password [facility] 02/05/2013, 11:05 AM  LOS: 7 days

## 2013-02-05 NOTE — Discharge Summary (Signed)
Physician Discharge Summary  Brian Hoover:[id number] DOB: May 17, 1932 DOA: 01/29/2013  PCP: Berenda Morale, MD  Admit date: 01/29/2013 Discharge date: 02/05/2013  Time spent: 35 minutes  Recommendations for Outpatient Follow-up:  Please followup with Dr. Tyrone Sage as already scheduled.  Please followup with Berenda Morale, MD (PCP) in 1 week.  Discharge Diagnoses:  Principal Problem:   Traumatic pneumothorax (Right) Active Problems:   PREMATURE VENTRICULAR CONTRACTIONS, FREQUENT   Chronic systolic heart failure   COPD   Multiple rib fractures (Right)   Unspecified constipation   Discharge Condition: Stable  Diet recommendation: Heart healthy diet.  Filed Weights   02/02/13 0603 02/04/13 0508 02/05/13 0525  Weight: 78.2 kg (172 lb 6.4 oz) 77.9 kg (171 lb 11.8 oz) 78 kg (171 lb 15.3 oz)    History of present illness:  Brian Hoover is a 79 y.o. male with history of palpitations, documented PVCs and NSVT who underwent catheter ablation several years ago. Post procedure, his ventricular arrhythmias have resolved but his left ventricular function remains depressed. He has had one episode of syncope and a few falls. Yesterday, patient slipped in the shower at the [facility] and fell landing on right side of his back. He started experiencing excruciating right-sided back and anterior chest pain which was made worse with movement or deep breaths but was not associated with dyspnea. He denies passing out, head or neck injury or bleeding. He had difficulty getting up but eventually got up by himself and drove home. He took a dose of his wife's oxycodone with minimal relief. He had mild nausea but no vomiting. This morning, he kept up as scheduled appointment with Dr. Ladona Ridgel who advised him to followup with his PCP with a chest x-ray. Chest x-ray at the PCPs office apparently showed right rib fractures and pneumothorax and patient was advised to come to the ED immediately. In the ED, chest x-ray shows  multiple mildly displaced right rib fractures (5-7) and large right-sided pneumothorax measuring approximately 50%. TCTS was consulted by EDP and have placed a right-sided chest tube and request hospitalist service to admit for further evaluation and management.  Hospital Course:  Traumatic pneumothorax (Right) - Patient had a chest tube placed on 01/29/13 by Dr. Jaynie Collins from thoracic surgery. - Chest tube has been removed 3/1. - 2 subsequent chest x-ray revealed stability of the pneumothorax.  Multiple rib fractures (Right) - Patient admitted for pain control. - Placed initially on Dilaudid iv, on 02/01/13  Started on Oxycontin 20 every 12 hours. Also was on Toradol, which has been discontinued. Continue incentive spirometry as well and began to mobilize patient with PT. - Patient still with significant pain but having decreased ability to concentrate and function due to high dose oxycodone. - On 02/03/2013 3 the wife has asked that we discontinue oxycodone due to the patient having difficulty concentrating and hallucinating through the night. - Patient was placed on MS Contin 30 mg every 8 hours and breakthrough MSIR at 30 mg every 4 hours as needed. Continues to have hallucinations at times, continue current regimen. - Discussed with the patient and wife that at home if pain improves to wean MSIR to q8h then to wean MS Contin to q12h. If they are uncomfortable with weaning the patient off pain medications then to leave as is and to discuss with pain medications with his PCP. They expressed understanding.  Acute delirium/Acute encephalopathy Due to pain medications, continue to monitor.  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 Stable.  Chronic systolic heart  failure Currently stable/compensated.  COPD Stable.  Constipation Continue MiraLAX (increase to BID). Continue Colace and Senna. Give a bottle of magnesium citrate and SMOG enema, with good results. Last bowel movement was on  02/04/2013. Continue bowel regimen after discharge as long as patient is on pain medications.  Procedures:  Chest tube  Consultations:  Cardiothoracic surgery  Discharge Exam: Filed Vitals:   02/04/13 1405 02/04/13 1752 02/04/13 2205 02/05/13 0525  BP: 110/46 104/49 94/63 100/70  Pulse: 61 64 63 59  Temp: 98.4 F (36.9 C) 98.4 F (36.9 C) 97.8 F (36.6 C) 97.6 F (36.4 C)  TempSrc: Oral Oral Oral Oral  Resp: 18 18 16 18   Height:      Weight:    78 kg (171 lb 15.3 oz)  SpO2: 100% 92% 95% 98%   Discharge Instructions  Discharge Orders   Future Appointments Provider Department Dept Phone   02/13/2013 10:15 AM Delight Ovens, MD [facility] [phone]   Future Orders Complete By Expires     Diet - low sodium heart healthy  As directed     Discharge instructions  As directed     Comments:      Please followup with Dr. Tyrone Sage as already scheduled.  Please followup with Berenda Morale, MD (PCP) in 1 week.    Increase activity slowly  As directed         Medication List    TAKE these medications       albuterol 108 (90 BASE) MCG/ACT inhaler  Commonly known as:  PROVENTIL HFA;VENTOLIN HFA  Inhale 2 puffs into the lungs every 6 (six) hours as needed for wheezing.     aspirin 81 MG tablet  Take 81 mg by mouth daily.     cholecalciferol 1000 UNITS tablet  Commonly known as:  VITAMIN D  Take 1,000 Units by mouth daily.     DSS 100 MG Caps  Take 100 mg by mouth 3 (three) times daily.     lisinopril 20 MG tablet  Commonly known as:  PRINIVIL,ZESTRIL  Take 20 mg by mouth daily.     morphine 30 MG 12 hr tablet  Commonly known as:  MS CONTIN  Take 1 tablet (30 mg total) by mouth every 8 (eight) hours.     morphine 30 MG tablet  Commonly known as:  MSIR  Take 1 tablet (30 mg total) by mouth every 4 (four) hours as needed for pain.     multivitamin with minerals tablet  Take 1 tablet by mouth daily.     OLANZapine 10  MG tablet  Commonly known as:  ZYPREXA  Take 10 mg by mouth at bedtime.     polyethylene glycol packet  Commonly known as:  MIRALAX / GLYCOLAX  Take 17 g by mouth 2 (two) times daily.     senna 8.6 MG Tabs  Commonly known as:  SENOKOT  Take 2 tablets (17.2 mg total) by mouth 2 (two) times daily.     valACYclovir 500 MG tablet  Commonly known as:  VALTREX  Take 500 mg by mouth daily.           Follow-up Information   Follow up with Delight Ovens, MD On 02/13/2013. (Have a chest x-ray at [facility] (first floor) at 9:15, then see MD at 10:15)    Contact information:   [address] [place] [address] [phone]       Follow up with [facility],  MD. Schedule an appointment as soon as possible for a visit in 1 week.   Contact information:   [address] [place] [address] [phone]        The results of significant diagnostics from this hospitalization (including imaging, microbiology, ancillary and laboratory) are listed below for reference.    Significant Diagnostic Studies: Dg Chest 2 View  02/01/2013  *RADIOLOGY REPORT*  Clinical Data: Follow-up pneumothorax following chest tube removal.  CHEST - 2 VIEW  Comparison: 01/31/2013 and 01/30/2013.  Findings: There is a residual small (approximately 10%) right apical pneumothorax following chest tube removal.  In retrospect, this was probably present on yesterday's study and does not appear significantly changed.  There is no left-sided pneumothorax or mediastinal shift.  Bibasilar atelectasis is unchanged.  Right- sided rib fractures are noted.  Heart size and mediastinal contours are stable.  IMPRESSION: Stable small right apical pneumothorax following right chest tube removal.  Underlying right rib fractures and bibasilar atelectasis noted.   Original Report Authenticated By: Carey Bullocks, M.D.    Dg Chest 2 View  01/29/2013  *RADIOLOGY REPORT*  Clinical Data: Follow up pneumothorax   CHEST - 2 VIEW  Comparison: 01/29/2013  Findings: There are multiple mildly displaced rib fractures identified.  This involves the right fifth, sixth and seventh ribs.  The patient has a large right-sided pneumothorax which measures approximately 50%.  This is slightly increased in volume from previous exam.  Heart size is normal.  No effusions or edema.  Left lung is clear. Prior ORIF of left clavicle fracture.  IMPRESSION:  1.  Right rib fractures. 2.  Increase in volume of right-sided pneumothorax.  Now approximately 50%.   Original Report Authenticated By: Signa Kell, M.D.    Dg Chest Port 1 View  02/04/2013  *RADIOLOGY REPORT*  Clinical Data: Right-sided pneumothorax follow up.  Right chest pain.  PORTABLE CHEST - 1 VIEW  Comparison: 02/02/2013.  Findings: Small right apical pneumothorax (5%) minimally smaller than on the prior examination.  Multiple right rib fractures including displaced right sixth, seventh, eighth and ninth rib fracture  Hazy opacification right lung base may represent pleural fluid combined with atelectasis.  Infiltrate not excluded.  Prior left clavicle surgery.  Calcified mildly tortuous aorta.  Heart slightly enlarged.  IMPRESSION: Small right apical pneumothorax (5%) minimally smaller than on the prior examination.  Multiple right rib fractures including displaced right sixth, seventh, eighth and ninth rib fracture  Hazy opacification right lung base may represent pleural fluid combined with atelectasis.  Infiltrate not excluded.   Original Report Authenticated By: Lacy Duverney, M.D.    Dg Chest Port 1 View  02/02/2013  *RADIOLOGY REPORT*  Clinical Data: Right pneumothorax.  PORTABLE CHEST - 1 VIEW  Comparison: 02/01/2013  Findings: Small right pneumothorax again noted, unchanged.  Right lower lobe atelectasis has slightly increased.  Small right pleural effusion is stable.  Multiple right-sided rib fractures noted. Mild cardiomegaly.  No confluent opacity on the left.   IMPRESSION: Stable right apical pneumothorax, small.  Increasing right base atelectasis.  Stable small right effusion.   Original Report Authenticated By: Charlett Nose, M.D.    Dg Chest Port 1 View  01/31/2013  *RADIOLOGY REPORT*  Clinical Data: Right chest tube removal. Traumatic right pneumothorax.  PORTABLE CHEST - 1 VIEW 12 noon  Comparison: 01/31/2013 at 5:44 a.m.  Findings: Right chest tube has been removed.  No pneumothorax. Minimal atelectasis at the lung bases, improved.  Heart size and vascularity are normal.  IMPRESSION: No pneumothorax after chest tube removal.  Minimal basilar atelectasis.   Original Report Authenticated By: Francene Boyers, M.D.    Dg Chest Port 1 View  01/31/2013  *RADIOLOGY REPORT*  Clinical Data: Postop tube check  PORTABLE CHEST - 1 VIEW  Comparison: Chest radiograph 01/30/2013  Findings: Right-sided chest tube in place through a right thoracotomy defect.  No appreciable right pneumothorax.  There is a mild air space disease at the right lung base similar to prior. Left lung is clear. Stable mildly enlarged heart silhouette.  IMPRESSION:  1.  Postoperative change in the right hemithorax with chest tube in place.  No significant change. 2.  No pneumothorax and right lower lobe air space disease.   Original Report Authenticated By: Genevive Bi, M.D.    Dg Chest Port 1 View  01/30/2013  *RADIOLOGY REPORT*  Clinical Data: Postop check chest tube  PORTABLE CHEST - 1 VIEW  Comparison: Prior chest x-ray 01/29/2013  Findings: Stable position of right thoracostomy tube.  No definite pneumothorax identified.  Improving bibasilar atelectasis. Slightly increased pulmonary vascular congestion without overt edema.  Right-sided rib fractures are unchanged.  No acute osseous abnormality.  IMPRESSION:  1.  Stable chest tube.  No definite pneumothorax identified on today's exam. 2.  Slightly increased pulmonary vascular congestion without overt edema. 2.  Similar to slightly improved  bibasilar atelectasis.   Original Report Authenticated By: Malachy Moan, M.D.    Dg Chest Port 1 View  01/29/2013  *RADIOLOGY REPORT*  Clinical Data: 79 year old male with pneumothorax - status post right thoracostomy tube placement.  PORTABLE CHEST - 1 VIEW  Comparison: Films earlier this day and prior chest radiograph  Findings: .  There is been interval placement of a right thoracostomy tube.  A small residual right lateral basilar pneumothorax is noted - less than 5%. Right basilar atelectasis is present. Fractures of the right 5th - 9th ribs again noted. The cardiomediastinal silhouette is unremarkable. The left lung is clear.  IMPRESSION: Right thoracostomy tube placement with very small residual right lateral basilar pneumothorax - less than 5%.  Mild right basilar atelectasis.  Right rib fractures again noted.   Original Report Authenticated By: Harmon Pier, M.D.     Microbiology: Recent Results (from the past 240 hour(s))  MRSA PCR SCREENING     Status: None   Collection Time    01/29/13  6:52 PM      Result Value Range Status   MRSA by PCR NEGATIVE  NEGATIVE Final   Comment:            The GeneXpert MRSA Assay (FDA     approved for NASAL specimens     only), is one component of a     comprehensive MRSA colonization     surveillance program. It is not     intended to diagnose MRSA     infection nor to guide or     monitor treatment for     MRSA infections.     Labs: Basic Metabolic Panel:  Recent Labs Lab 01/29/13 1604 01/31/13 0922 02/01/13 0600 02/03/13 0655  NA 134* 130* 130* 130*  K 4.9 4.2 4.7 4.5  CL 96 93* 96 95*  CO2 29 29 28 31   GLUCOSE 127* 121* 94 102*  BUN 28* 17 29* 32*  CREATININE 0.89 0.70 0.92 0.92  CALCIUM 10.0 9.0 9.1 9.0   Liver Function Tests: No results found for this basename: AST, ALT, ALKPHOS, BILITOT, PROT, ALBUMIN,  in the  last 168 hours No results found for this basename: LIPASE, AMYLASE,  in the last 168 hours No results found for  this basename: AMMONIA,  in the last 168 hours CBC:  Recent Labs Lab 01/29/13 1604  WBC 10.9*  NEUTROABS 9.3*  HGB 15.8  HCT 45.0  MCV 89.8  PLT 245   Cardiac Enzymes: No results found for this basename: CKTOTAL, CKMB, CKMBINDEX, TROPONINI,  in the last 168 hours BNP: BNP (last 3 results) No results found for this basename: PROBNP,  in the last 8760 hours CBG: No results found for this basename: GLUCAP,  in the last 168 hours     Signed:  REDDY,SRIKAR A  [facility] 02/05/2013, 11:14 AM

## 2013-02-05 NOTE — Progress Notes (Signed)
Pt's wife called re: unable to obtain [facility] as originally planned. Marcelino Duster, case manager and Dr. Betti Cruz  notified. Order entered per Dr. Betti Cruz. And copy faxed to [facility].

## 2013-02-05 NOTE — Progress Notes (Signed)
Occupational Therapy Treatment Patient Details Name: Brian Hoover MRN: [id number] DOB: August 10, 1932 Today's Date: 02/05/2013 Time: 8469-6295 OT Time Calculation (min): 24 min  OT Assessment / Plan / Recommendation Comments on Treatment Session Pt much improved and most likely going home today.  Rec 24 hour S due to mild cognitive deficits.    Follow Up Recommendations  Home health OT    Barriers to Discharge       Equipment Recommendations  3 in 1 bedside comode    Recommendations for Other Services    Frequency Min 3X/week   Plan Discharge plan remains appropriate    Precautions / Restrictions Precautions Precautions: Fall Restrictions Weight Bearing Restrictions: No   Pertinent Vitals/Pain Pt reports mild rib pain    ADL  Eating/Feeding: Performed;Independent Where Assessed - Eating/Feeding: Chair Grooming: Performed;Wash/dry hands;Supervision/safety Where Assessed - Grooming: Supported standing Upper Body Dressing: Performed;Set up Where Assessed - Upper Body Dressing: Unsupported sitting Lower Body Dressing: Performed;Supervision/safety Where Assessed - Lower Body Dressing: Supported sit to stand Toilet Transfer: Performed;Min guard Statistician Method: Other (comment) (ambulated holding wall as he will have to do at home) Acupuncturist: Comfort height toilet;Grab bars Toileting - Clothing Manipulation and Hygiene: Performed;Min guard Where Assessed - Toileting Clothing Manipulation and Hygiene: Sit to stand from 3-in-1 or toilet;Sit on 3-in-1 or toilet Equipment Used: Rolling walker Transfers/Ambulation Related to ADLs: Pt S with occasional min guard when walking with walker in tight areas. ADL Comments: Pt with increased I with adls today.  pt still having some mild cognitive issues but better today in re: to adls.    OT Diagnosis:    OT Problem List:   OT Treatment Interventions:     OT Goals Acute Rehab OT Goals OT Goal Formulation: With  patient Time For Goal Achievement: 02/12/13 Potential to Achieve Goals: Good ADL Goals Pt Will Perform Grooming: with set-up;Sitting, chair;Standing at sink ADL Goal: Grooming - Progress: Met Pt Will Perform Upper Body Dressing: with supervision;with set-up;Sitting, chair;Sitting, bed ADL Goal: Upper Body Dressing - Progress: Met Pt Will Perform Lower Body Dressing: with min assist;Sit to stand from bed;Sit to stand from chair;with adaptive equipment ADL Goal: Lower Body Dressing - Progress: Met Pt Will Transfer to Toilet: with supervision ADL Goal: Toilet Transfer - Progress: Met Pt Will Perform Toileting - Clothing Manipulation: Sitting on 3-in-1 or toilet;with supervision ADL Goal: Toileting - Clothing Manipulation - Progress: Met Pt Will Perform Toileting - Hygiene: Sitting on 3-in-1 or toilet;with modified independence ADL Goal: Toileting - Hygiene - Progress: Met Pt Will Perform Tub/Shower Transfer: Shower transfer;with supervision  Visit Information  Last OT Received On: 02/05/13 Assistance Needed: +1 PT/OT Co-Evaluation/Treatment: Yes    Subjective Data      Prior Functioning       Cognition  Cognition Overall Cognitive Status: Appears within functional limits for tasks assessed/performed Arousal/Alertness: Awake/alert Orientation Level: Oriented X4 / Intact Behavior During Session: [facility] for tasks performed Cognition - Other Comments: mild confusion at times when responding to questions but easily reoriented to the question being asked.  Delierum seems better today.    Mobility  Bed Mobility Bed Mobility: Rolling Right;Right Sidelying to Sit;Sitting - Scoot to Edge of Bed Rolling Right: 5: Supervision Right Sidelying to Sit: 5: Supervision Sitting - Scoot to Edge of Bed: 5: Supervision Details for Bed Mobility Assistance: Bed mobility becoming less painful. Transfers Transfers: Sit to Stand;Stand to Sit Sit to Stand: 5: Supervision;With armrests Stand to Sit: 5:  Supervision;To chair/3-in-1 Details  for Transfer Assistance: Vc's for safety, hand placement. pt required 3 attempts to stand with initial standing.    Exercises      Balance     End of Session OT - End of Session Activity Tolerance: Patient tolerated treatment well Patient left: in chair;with call bell/phone within reach;with family/visitor present Nurse Communication: Mobility status  GO     Hope Budds 02/05/2013, 1:07 PM [phone]

## 2013-02-11 ENCOUNTER — Other Ambulatory Visit: Payer: Self-pay | Admitting: *Deleted

## 2013-02-11 DIAGNOSIS — J939 Pneumothorax, unspecified: Secondary | ICD-10-CM

## 2013-02-13 ENCOUNTER — Ambulatory Visit (INDEPENDENT_AMBULATORY_CARE_PROVIDER_SITE_OTHER): Payer: Medicare Other | Admitting: Cardiothoracic Surgery

## 2013-02-13 ENCOUNTER — Encounter: Payer: Self-pay | Admitting: Cardiothoracic Surgery

## 2013-02-13 ENCOUNTER — Ambulatory Visit
Admission: RE | Admit: 2013-02-13 | Discharge: 2013-02-13 | Disposition: A | Discharge status: Home / Self Care | Payer: Medicare Other | Source: Ambulatory Visit | Attending: Cardiothoracic Surgery | Admitting: Cardiothoracic Surgery

## 2013-02-13 VITALS — BP 114/65 | HR 61 | Resp 16 | Ht 70.0 in | Wt 173.0 lb

## 2013-02-13 DIAGNOSIS — J939 Pneumothorax, unspecified: Secondary | ICD-10-CM

## 2013-02-13 DIAGNOSIS — J9383 Other pneumothorax: Secondary | ICD-10-CM

## 2013-02-13 NOTE — Patient Instructions (Signed)
Rib Fracture  Your caregiver has diagnosed you as having a rib fracture (a break). This can occur by a blow to the chest, by a fall against a hard object, or by violent coughing or sneezing. There may be one or many breaks. Rib fractures may heal on their own within 3 to 8 weeks. The longer healing period is usually associated with a continued cough or other aggravating activities.  HOME CARE INSTRUCTIONS    Avoid strenuous activity. Be careful during activities and avoid bumping the injured rib. Activities that cause pain pull on the fracture site(s) and are best avoided if possible.   Eat a normal, well-balanced diet. Drink plenty of fluids to avoid constipation.   Take deep breaths several times a day to keep lungs free of infection. Try to cough several times a day, splinting the injured area with a pillow. This will help prevent pneumonia.   Do not wear a rib belt or binder. These restrict breathing which can lead to pneumonia.   Only take over-the-counter or prescription medicines for pain, discomfort, or fever as directed by your caregiver.  SEEK MEDICAL CARE IF:   You develop a continual cough, associated with thick or bloody sputum.  SEEK IMMEDIATE MEDICAL CARE IF:    You have a fever.   You have difficulty breathing.   You have nausea (feeling sick to your stomach), vomiting, or abdominal (belly) pain.   You have worsening pain, not controlled with medications.  Document Released: 11/20/2005 Document Revised: 02/12/2012 Document Reviewed: 04/24/2007  [employer] Patient Information 2013 [employer].

## 2013-02-13 NOTE — Progress Notes (Signed)
[address]            [place] [address]          [phone]       Brian Hoover [facility] Medical Record #[id number] Date of Birth: [birth year]  [name], MD Berenda Morale, MD  Chief Complaint:    Follow Up Visit Status post fall with right rib fractures and pneumothorax  History of Present Illness:      Patient is a 79 year old male who was recently seen in the hospital a right chest tube was placed for pneumothorax following a fall with rib fractures. He returns to the office today with a followup chest x-ray. He notes that the discomfort is markedly improved and because of side effects of morphine and nausea the patient has discontinued all of his pain medication other than Advil when necessary. He has had some loose stools and has stopped all the stool softener she was discharged home on. He's had no significant shortness of breath, and is increasing his activity appropriately.    History  Smoking status  . Former Smoker -- 1.50 packs/day  . Types: Cigarettes  . Quit date: 12/04/1960  Smokeless tobacco  . Never Used       Allergies  Allergen Reactions  . Sulfa Antibiotics   . Sulfamethoxazole     REACTION: hives    Current Outpatient Prescriptions  Medication Sig Dispense Refill  . aspirin 81 MG tablet Take 81 mg by mouth daily.      . cholecalciferol (VITAMIN D) 1000 UNITS tablet Take 1,000 Units by mouth daily.      Marland Kitchen lisinopril (PRINIVIL,ZESTRIL) 20 MG tablet Take 20 mg by mouth daily.      . Multiple Vitamins-Minerals (MULTIVITAMIN WITH MINERALS) tablet Take 1 tablet by mouth daily.      Marland Kitchen OLANZapine (ZYPREXA) 10 MG tablet Take 10 mg by mouth at bedtime.      . valACYclovir (VALTREX) 500 MG tablet Take 500 mg by mouth daily.      Marland Kitchen albuterol (PROVENTIL HFA;VENTOLIN HFA) 108 (90 BASE) MCG/ACT inhaler Inhale 2 puffs into the lungs every 6 (six) hours as needed for wheezing.       Marland Kitchen morphine (MS CONTIN) 30 MG 12  hr tablet Take 1 tablet (30 mg total) by mouth every 8 (eight) hours.  42 tablet  0  . morphine (MSIR) 30 MG tablet Take 1 tablet (30 mg total) by mouth every 4 (four) hours as needed for pain.  70 tablet  0   No current facility-administered medications for this visit.       Physical Exam: BP 114/65  Pulse 61  Resp 16  Ht 5\' 10"  (1.778 m)  Wt 173 lb (78.472 kg)  BMI 24.82 kg/m2  SpO2 96%  General appearance: alert, cooperative and appears stated age Neurologic: intact Heart: regular rate and rhythm, S1, S2 normal, no murmur, click, rub or gallop and normal apical impulse Lungs: clear to auscultation bilaterally and normal percussion bilaterally Abdomen: soft, non-tender; bowel sounds normal; no masses,  no organomegaly Extremities: extremities normal, atraumatic, no cyanosis or edema and Homans sign is negative, no sign of DVT .   Diagnostic Studies & Laboratory data:         Recent Radiology Findings: Dg Chest 2 View  02/13/2013  *RADIOLOGY REPORT*  Clinical Data: Fall with history of  right pneumothorax and rib fractures.  CHEST - 2 VIEW  Comparison: 02/04/2013.  Findings: Trachea is midline.  Heart size normal.  Right pneumothorax has resolved in the interval.  Mild volume loss in the right hemithorax with slight elevation of the right hemidiaphragm. There may be mild atelectasis and a small amount of left pleural fluid at the base the right hemithorax as well.  Left lung is clear.  Plate and screw fixation of a left clavicle fracture is again seen, as are healing right rib fractures.  Mild anterior wedging of a lower thoracic vertebral body is unchanged from the 02/01/2013.  IMPRESSION:  1.  Resolved right pneumothorax. 2.  Probable mild volume loss at the right lung base and a small amount of right pleural fluid.   Original Report Authenticated By: Leanna Battles, M.D.       Recent Labs: Lab Results  Component Value Date   WBC 10.9* 01/29/2013   HGB 15.8 01/29/2013   HCT  45.0 01/29/2013   PLT 245 01/29/2013   GLUCOSE 102* 02/03/2013   NA 130* 02/03/2013   K 4.5 02/03/2013   CL 95* 02/03/2013   CREATININE 0.92 02/03/2013   BUN 32* 02/03/2013   CO2 31 02/03/2013   INR 0.92 01/29/2013      Assessment / Plan:     Overall the patient appears to be doing well in followup after rib fractures and pneumothorax treated with tube thoracostomy. We'll see him back again in one month with a followup chest x-ray.   Nayan Proch B 02/13/2013 10:26 AM

## 2013-03-11 ENCOUNTER — Ambulatory Visit (INDEPENDENT_AMBULATORY_CARE_PROVIDER_SITE_OTHER): Payer: Medicare Other | Admitting: Diagnostic Neuroimaging

## 2013-03-11 ENCOUNTER — Encounter: Payer: Self-pay | Admitting: Diagnostic Neuroimaging

## 2013-03-11 VITALS — BP 112/65 | HR 65 | Temp 98.3°F | Ht 70.0 in | Wt 175.0 lb

## 2013-03-11 DIAGNOSIS — M216X9 Other acquired deformities of unspecified foot: Secondary | ICD-10-CM

## 2013-03-11 DIAGNOSIS — M21371 Foot drop, right foot: Secondary | ICD-10-CM | POA: Insufficient documentation

## 2013-03-11 NOTE — Progress Notes (Signed)
[facility]  PATIENT: Brian Hoover DOB: 1932-06-24  REFERRING CLINICIAN: Zachery Dauer HISTORY FROM: patient and wife REASON FOR VISIT: right foot weakness   HISTORICAL  CHIEF COMPLAINT:  Chief Complaint  Patient presents with  . Gait Problem    R. foot drop    HISTORY OF PRESENT ILLNESS:   79 year old right-handed male with history of prostate cancer, cluster headaches, here for evaluation of right foot drop. I previous evaluated patient for dizziness episodes and presyncope, but now patient is here for a new problem.  01/28/2013 patient was at the [facility], stepped out of the shower into locker room area and didn't realize that the floor had just been mopped. Patient took a step and as his feet slipped out from underneath him. Patient fell down towards his right side. He had significant pain and was diagnosed with multiple rib fractures and pneumothorax. He was admitted to the hospital for chest tube placement. 2 weeks after his fall patient noted some weakness of his right foot. He was unable to dorsiflex his right foot. Patient was diagnosed with a right foot drop and referred to see me for further evaluation.  Patient denies any significant low back pain, numbness in his right leg or radiating pain from his back to his foot. No significant bowel or bladder dysfunction.  REVIEW OF SYSTEMS: Full 14 system review of systems performed and notable only for nothing except for as noted in history of present illness.  ALLERGIES: Allergies  Allergen Reactions  . Sulfa Antibiotics   . Sulfamethoxazole     REACTION: hives    HOME MEDICATIONS: Outpatient Prescriptions Prior to Visit  Medication Sig Dispense Refill  . albuterol (PROVENTIL HFA;VENTOLIN HFA) 108 (90 BASE) MCG/ACT inhaler Inhale 2 puffs into the lungs every 6 (six) hours as needed for wheezing.       Marland Kitchen aspirin 81 MG tablet Take 81 mg by mouth daily.      . cholecalciferol (VITAMIN D) 1000 UNITS tablet Take  1,000 Units by mouth daily.      Marland Kitchen lisinopril (PRINIVIL,ZESTRIL) 20 MG tablet Take 20 mg by mouth daily.      . Multiple Vitamins-Minerals (MULTIVITAMIN WITH MINERALS) tablet Take 1 tablet by mouth daily.      Marland Kitchen OLANZapine (ZYPREXA) 10 MG tablet Take 10 mg by mouth at bedtime.      . valACYclovir (VALTREX) 500 MG tablet Take 500 mg by mouth daily.      Marland Kitchen morphine (MS CONTIN) 30 MG 12 hr tablet Take 1 tablet (30 mg total) by mouth every 8 (eight) hours.  42 tablet  0  . morphine (MSIR) 30 MG tablet Take 1 tablet (30 mg total) by mouth every 4 (four) hours as needed for pain.  70 tablet  0   No facility-administered medications prior to visit.    PAST MEDICAL HISTORY: Past Medical History  Diagnosis Date  . Chronic systolic heart failure   . PVC (premature ventricular contraction)   . Bradycardia   . COPD (chronic obstructive pulmonary disease)   . Dyspnea   . Pneumothorax 01/28/2013  . CHF (congestive heart failure)   . GERD (gastroesophageal reflux disease)   . Migraine     PAST SURGICAL HISTORY: Past Surgical History  Procedure Laterality Date  . Tendon repair      right foot  . Shoulder arthroscopy    . Prostate surgery    . Clavicle surgery    . Chest tube insertion Right 01/29/2013  FAMILY HISTORY: Family History  Problem Relation Age of Onset  . Breast cancer Mother   . Uterine cancer Mother     SOCIAL HISTORY:  History   Social History  . Marital Status: Married    Spouse Name: N/A    Number of Children: 2  . Years of Education: N/A   Occupational History  . retired Art gallery manager    Social History Main Topics  . Smoking status: Former Smoker -- 1.50 packs/day    Types: Cigarettes    Quit date: 12/04/1960  . Smokeless tobacco: Never Used  . Alcohol Use: 6.0 oz/week    10 Shots of liquor per week     Comment: freq  . Drug Use: No  . Sexually Active: Not on file   Other Topics Concern  . Not on file   Social History Narrative      Pt lives at home  with his spouse.   He does use caffeine.     PHYSICAL EXAM  Filed Vitals:   03/11/13 0822  BP: 112/65  Pulse: 65  Temp: 98.3 F (36.8 C)  TempSrc: Oral  Height: 5\' 10"  (1.778 m)  Weight: 175 lb (79.379 kg)   Body mass index is 25.11 kg/(m^2).  GENERAL EXAM: Patient is in no distress  CARDIOVASCULAR: Regular rate and rhythm, no murmurs, no carotid bruits  NEUROLOGIC: MENTAL STATUS: awake, alert, language fluent, comprehension intact, naming intact CRANIAL NERVE: no papilledema on fundoscopic exam, pupils equal and reactive to light, visual fields full to confrontation, extraocular muscles intact, no nystagmus, facial sensation and strength symmetric, uvula midline, shoulder shrug symmetric, tongue midline. MOTOR: MILD HEAD AND POSTURAL BUE TREMOR. Normal bulk and tone, full strength in the BUE, LLE; RIGHT FOOT DF 3/5, EVERSION 5-/5, INVERSION 5/5, PLANTAR FLEX 5/5.  SENSORY: VIB < 5 SEC AT BILATERAL TOES; DECR PP IN RIGHT FOOT B/W TOES 1 AND 2.  COORDINATION: finger-nose-finger, fine finger movements normal REFLEXES: BUE 1, BLE 0. DOWN GOING TOES. GAIT/STATION: RIGHT FOOT DROP GAIT. CAN WALK ON TOES, BUT NOT RIGHT HEEL. ROMBERG TESTING --> MILD SWAY WITHOUT TAKING A STEP.   DIAGNOSTIC DATA (LABS, IMAGING, TESTING) - I reviewed patient records, labs, notes, testing and imaging myself where available.  Lab Results  Component Value Date   WBC 10.9* 01/29/2013   HGB 15.8 01/29/2013   HCT 45.0 01/29/2013   MCV 89.8 01/29/2013   PLT 245 01/29/2013      Component Value Date/Time   NA 130* 02/03/2013 0655   K 4.5 02/03/2013 0655   CL 95* 02/03/2013 0655   CO2 31 02/03/2013 0655   GLUCOSE 102* 02/03/2013 0655   BUN 32* 02/03/2013 0655   CREATININE 0.92 02/03/2013 0655   CALCIUM 9.0 02/03/2013 0655   GFRNONAA 78* 02/03/2013 0655   GFRAA >90 02/03/2013 0655   No results found for this basename: CHOL, HDL, LDLCALC, LDLDIRECT, TRIG, CHOLHDL   No results found for this basename: HGBA1C   No  results found for this basename: VITAMINB12   No results found for this basename: TSH     ASSESSMENT AND PLAN  79 y.o. year old male  has a past medical history of Chronic systolic heart failure; PVC (premature ventricular contraction); Bradycardia; COPD (chronic obstructive pulmonary disease); Dyspnea; Pneumothorax (01/28/2013); CHF (congestive heart failure); GERD (gastroesophageal reflux disease); and Migraine. here with right foot dorsiflexion weakness 2-3 weeks ago; also s/p fall 6 weeks ago.   On exam today, patient has notable weakness in tibialis anterior and  extensor hallucis longus, no definite weakness in either his or inversion of the right foot, and slightly decreased sensation between the first and second toe on the right foot. Clinically exam findings are mostconsistent with a right deep peroneal neuropathy. In addition patient has diffusely decreased sensation in bilateral lower extremities and areflexic at the knees and ankles. This raises possibility of underlying peripheral neuropathy as well. The timing of the symptoms following a significant fall on a hard surface, raises possibility of either a posttraumatic lumbar radiculopathy (L5) or posttraumatic right peroneal neuropathy. No evidence of upper motor neuron dysfunction.   I will check MRI of lumbar spine, EMG, and refer patient to physical therapy.   Orders Placed This Encounter  Procedures  . MR Lumbar Spine Wo Contrast  . Ambulatory referral to Physical Therapy  . NCV with EMG(electromyography)     Suanne Marker, MD 03/11/2013, 9:12 AM Certified in Neurology, Neurophysiology and Neuroimaging  [facility] [address], [address] [place], [place] [address] [phone]

## 2013-03-11 NOTE — Patient Instructions (Addendum)
I will order MRI, EMG and PT.

## 2013-03-17 ENCOUNTER — Ambulatory Visit (INDEPENDENT_AMBULATORY_CARE_PROVIDER_SITE_OTHER): Payer: Medicare Other | Admitting: Diagnostic Neuroimaging

## 2013-03-17 ENCOUNTER — Encounter (INDEPENDENT_AMBULATORY_CARE_PROVIDER_SITE_OTHER): Payer: Medicare Other

## 2013-03-17 DIAGNOSIS — M21371 Foot drop, right foot: Secondary | ICD-10-CM

## 2013-03-17 DIAGNOSIS — M216X9 Other acquired deformities of unspecified foot: Secondary | ICD-10-CM

## 2013-03-17 DIAGNOSIS — Z0289 Encounter for other administrative examinations: Secondary | ICD-10-CM

## 2013-03-17 NOTE — Procedures (Signed)
   [facility]  NCS (NERVE CONDUCTION STUDY) WITH EMG (ELECTROMYOGRAPHY) REPORT   STUDY DATE: 03/17/13 PATIENT NAME: Brian Hoover DOB: 05-Feb-1932 MRN: [id number]  ORDERING CLINICIAN: Joycelyn Schmid, MD  TECHNOLOGIST: Gearldine Shown ELECTROMYOGRAPHER: Glenford Bayley. Penumalli, MD  CLINICAL INFORMATION: 79 year old male with right foot drop. Evaluate for L5 radiculopathy versus peroneal neuropathy.  FINDINGS: NERVE CONDUCTION STUDY: Right peroneal motor responses normal distal latency, decreased amplitude, normal conduction velocity. Left peroneal motor response is normal. Bilateral tibial motor responses are normal. Bilateral H reflex responses with stimulation of the tibial nerves and recording over the soleus muscles are normal. Bilateral superficial peroneal sensory responses are normal.  NEEDLE ELECTROMYOGRAPHY: Needle examination of selected muscles of the right lower extremity demonstrates: Gluteus medius, Iliopsoas, vastus medialis, gastrocnemius - no abnormal spontaneous activity at rest and normal motor unit recruitment on exertion Tibialis anterior - 1+ positive sharp waves and fibrillation potentials at rest, rapid firing of large motor units on exertion Tibialis posterior - 1+ positive sharp waves and fibrillation potentials at rest, normal motor unit recruitment on exertion Right L5-S1 paraspinal muscle - 2+ positive sharp waves and fibrillation potentials   IMPRESSION:  This is an abnormal study demonstrating: 1. Electrodiagnostic evidence of right L5 radiculopathy with active and chronic findings. 2. No evidence of compressive peroneal neuropathy. No evidence of underlying diffuse polyneuropathy.   INTERPRETING PHYSICIAN:  Suanne Marker, MD Certified in Neurology, Neurophysiology and Neuroimaging  [facility] [address] [place], [place] [address] [phone]

## 2013-03-19 ENCOUNTER — Ambulatory Visit
Admission: RE | Admit: 2013-03-19 | Discharge: 2013-03-19 | Disposition: A | Discharge status: Home / Self Care | Payer: Medicare Other | Source: Ambulatory Visit | Attending: Diagnostic Neuroimaging | Admitting: Diagnostic Neuroimaging

## 2013-03-19 ENCOUNTER — Other Ambulatory Visit: Payer: Self-pay | Admitting: *Deleted

## 2013-03-19 DIAGNOSIS — M21371 Foot drop, right foot: Secondary | ICD-10-CM

## 2013-03-19 DIAGNOSIS — J939 Pneumothorax, unspecified: Secondary | ICD-10-CM

## 2013-03-19 DIAGNOSIS — M216X9 Other acquired deformities of unspecified foot: Secondary | ICD-10-CM

## 2013-03-20 ENCOUNTER — Encounter: Payer: Self-pay | Admitting: Cardiothoracic Surgery

## 2013-03-20 ENCOUNTER — Ambulatory Visit (INDEPENDENT_AMBULATORY_CARE_PROVIDER_SITE_OTHER): Payer: Medicare Other | Admitting: Cardiothoracic Surgery

## 2013-03-20 ENCOUNTER — Ambulatory Visit
Admission: RE | Admit: 2013-03-20 | Discharge: 2013-03-20 | Disposition: A | Discharge status: Home / Self Care | Payer: Medicare Other | Source: Ambulatory Visit | Attending: Cardiothoracic Surgery | Admitting: Cardiothoracic Surgery

## 2013-03-20 VITALS — BP 130/73 | HR 58 | Resp 18 | Ht 70.0 in | Wt 175.0 lb

## 2013-03-20 DIAGNOSIS — J9383 Other pneumothorax: Secondary | ICD-10-CM

## 2013-03-20 DIAGNOSIS — J939 Pneumothorax, unspecified: Secondary | ICD-10-CM

## 2013-03-20 NOTE — Progress Notes (Signed)
[address]            [place] [address]          [phone]       DEANNA BOEHLKE [facility] Medical Record #[id number] Date of Birth: 1932-09-07  [name], MD Berenda Morale, MD  Chief Complaint:    Follow Up Visit Status post fall with right rib fractures and pneumothorax  History of Present Illness:      Patient is a 79 year old male who was recently seen in the hospital a right chest tube was placed for pneumothorax following a fall with rib fractures. He returns to the office today with a followup chest x-ray. He continues to improve with decreasing discomfort in the right chest. He has had no syncopal or near syncopal episodes. He is making plans to go to [place] in the next week to fish.   History  Smoking status  . Former Smoker -- 1.50 packs/day  . Types: Cigarettes  . Quit date: 12/04/1960  Smokeless tobacco  . Never Used       Allergies  Allergen Reactions  . Sulfa Antibiotics   . Sulfamethoxazole     REACTION: hives    Current Outpatient Prescriptions  Medication Sig Dispense Refill  . albuterol (PROVENTIL HFA;VENTOLIN HFA) 108 (90 BASE) MCG/ACT inhaler Inhale 2 puffs into the lungs every 6 (six) hours as needed for wheezing.       Marland Kitchen aspirin 81 MG tablet Take 81 mg by mouth daily.      . cholecalciferol (VITAMIN D) 1000 UNITS tablet Take 1,000 Units by mouth daily.      Marland Kitchen doxepin (SINEQUAN) 10 MG capsule Take 10 mg by mouth at bedtime.      . fexofenadine (ALLEGRA) 180 MG tablet Take 180 mg by mouth daily.      Marland Kitchen lisinopril (PRINIVIL,ZESTRIL) 20 MG tablet Take 20 mg by mouth daily.      . Multiple Vitamins-Minerals (MULTIVITAMIN WITH MINERALS) tablet Take 1 tablet by mouth daily.      Marland Kitchen OLANZapine (ZYPREXA) 10 MG tablet Take 10 mg by mouth at bedtime.      . polyethylene glycol (MIRALAX / GLYCOLAX) packet 1 packet daily.      . valACYclovir (VALTREX) 500 MG tablet Take 500 mg by mouth daily.       No  current facility-administered medications for this visit.       Physical Exam: BP 130/73  Pulse 58  Resp 18  Ht 5\' 10"  (1.778 m)  Wt 175 lb (79.379 kg)  BMI 25.11 kg/m2  SpO2 95%  General appearance: alert, cooperative and appears stated age Neurologic: intact Heart: regular rate and rhythm, S1, S2 normal, no murmur, click, rub or gallop and normal apical impulse Lungs: clear to auscultation bilaterally and normal percussion bilaterally Abdomen: soft, non-tender; bowel sounds normal; no masses,  no organomegaly Extremities: extremities normal, atraumatic, no cyanosis or edema and Homans sign is negative, no sign of DVT .   Diagnostic Studies & Laboratory data:         Recent Radiology Findings: Dg Chest 2 View  03/20/2013  *RADIOLOGY REPORT*  Clinical Data: Chest pain.  History of right rib fractures and pneumothorax.  CHEST - 2 VIEW  Comparison: 02/13/2013 and 02/04/2013.  Findings: The heart size and mediastinal contours are stable.  The lungs are clear and there is no  pleural effusion or recurrent pneumothorax.  Multiple right-sided rib fractures appear stable. The left clavicle appears unchanged status post plate and screw fixation.  There is a stable lower thoracic compression deformity.  IMPRESSION: Stable post-traumatic chest.  No acute cardiopulmonary process.   Original Report Authenticated By: Carey Bullocks, M.D.       Recent Labs: Lab Results  Component Value Date   WBC 10.9* 01/29/2013   HGB 15.8 01/29/2013   HCT 45.0 01/29/2013   PLT 245 01/29/2013   GLUCOSE 102* 02/03/2013   NA 130* 02/03/2013   K 4.5 02/03/2013   CL 95* 02/03/2013   CREATININE 0.92 02/03/2013   BUN 32* 02/03/2013   CO2 31 02/03/2013   INR 0.92 01/29/2013      Assessment / Plan:     Overall the patient appears to be doing well in followup after rib fractures and pneumothorax treated with tube thoracostomy. We'll see him back again in 3 months with a followup chest x-ray.   Chanelle Hodsdon  B 03/20/2013 9:58 AM

## 2013-03-20 NOTE — Patient Instructions (Signed)
Chest xray looks good Return in 3 months fu chest xray

## 2013-03-31 ENCOUNTER — Telehealth: Payer: Self-pay

## 2013-03-31 NOTE — Telephone Encounter (Signed)
Called pt, gave MRI result. No change from MRI on 08/04/11. Pt says he will continue his PT and will give our office a call in a few weeks, if need be.

## 2013-04-14 ENCOUNTER — Encounter: Payer: Self-pay | Admitting: Diagnostic Neuroimaging

## 2013-04-14 ENCOUNTER — Ambulatory Visit (INDEPENDENT_AMBULATORY_CARE_PROVIDER_SITE_OTHER): Payer: Medicare Other | Admitting: Diagnostic Neuroimaging

## 2013-04-14 VITALS — BP 147/79 | HR 51 | Temp 98.5°F | Ht 70.0 in | Wt 170.0 lb

## 2013-04-14 DIAGNOSIS — M21371 Foot drop, right foot: Secondary | ICD-10-CM

## 2013-04-14 DIAGNOSIS — M5417 Radiculopathy, lumbosacral region: Secondary | ICD-10-CM

## 2013-04-14 DIAGNOSIS — IMO0002 Reserved for concepts with insufficient information to code with codable children: Secondary | ICD-10-CM

## 2013-04-14 DIAGNOSIS — M216X9 Other acquired deformities of unspecified foot: Secondary | ICD-10-CM

## 2013-04-14 NOTE — Progress Notes (Signed)
[facility]  PATIENT: Brian Hoover DOB: Dec 13, 1931  REFERRING CLINICIAN: Zachery Dauer HISTORY FROM: patient REASON FOR VISIT: right foot weakness   HISTORICAL  CHIEF COMPLAINT:  Chief Complaint  Patient presents with  . Follow-up    foot drop, balance     HISTORY OF PRESENT ILLNESS:   UPDATE 04/14/13: Since last visit patient is doing about the same. He is going to physical therapy. Continues to have intermittent balance difficulty and especially if he bends over and tries to pick something up. He is still catching his right toe on objects when he walks too quickly.  PRIOR HPI (03/11/13): 79 year old right-handed male with history of prostate cancer, cluster headaches, here for evaluation of right foot drop. I previous evaluated patient for dizziness episodes and presyncope, but now patient is here for a new problem.  01/28/2013 patient was at the [facility], stepped out of the shower into locker room area and didn't realize that the floor had just been mopped. Patient took a step and as his feet slipped out from underneath him. Patient fell down towards his right side. He had significant pain and was diagnosed with multiple rib fractures and pneumothorax. He was admitted to the hospital for chest tube placement. 2 weeks after his fall patient noted some weakness of his right foot. He was unable to dorsiflex his right foot. Patient was diagnosed with a right foot drop and referred to see me for further evaluation.  Patient denies any significant low back pain, numbness in his right leg or radiating pain from his back to his foot. No significant bowel or bladder dysfunction.  PRIOR HPI (10/28/12): 79 year old right-handed male with history of idiopathic cardiomyopathy, frequent PVCs, status post PVC ablation, here for evaluation of 30 minute episode of dizziness.  Patient was on a walk approximately 3 weeks ago, when all of a sudden at 2.5 mile mark, he developed severe  dizziness. He denies spinning sensation in, but rather malaise, lightheadedness, balance difficulty. He stumbled and fell down quite hard. He struck his forehead and had an abrasion with laceration. He had a very difficult time walking back home. He was able to walk from house to house, leaning on the garbage can to hold his balance. Eventually he made it back home, and within 50 minutes of coming home he was back to normal. He did not seek medical attention initially for this event. Now has seen PCP and cardiology. Wearing holter monitor now.   REVIEW OF SYSTEMS: Full 14 system review of systems performed and notable only for nothing except for as noted in history of present illness.  ALLERGIES: Allergies  Allergen Reactions  . Sulfa Antibiotics   . Sulfamethoxazole     REACTION: hives    HOME MEDICATIONS: Outpatient Prescriptions Prior to Visit  Medication Sig Dispense Refill  . aspirin 81 MG tablet Take 81 mg by mouth daily.      . cholecalciferol (VITAMIN D) 1000 UNITS tablet Take 1,000 Units by mouth daily.      . fexofenadine (ALLEGRA) 180 MG tablet Take 180 mg by mouth daily.      Marland Kitchen lisinopril (PRINIVIL,ZESTRIL) 20 MG tablet Take 20 mg by mouth daily.      . Multiple Vitamins-Minerals (MULTIVITAMIN WITH MINERALS) tablet Take 1 tablet by mouth daily.      Marland Kitchen OLANZapine (ZYPREXA) 10 MG tablet Take 10 mg by mouth at bedtime.      . polyethylene glycol (MIRALAX / GLYCOLAX) packet 1 packet daily.      Marland Kitchen  valACYclovir (VALTREX) 500 MG tablet Take 500 mg by mouth daily.      Marland Kitchen albuterol (PROVENTIL HFA;VENTOLIN HFA) 108 (90 BASE) MCG/ACT inhaler Inhale 2 puffs into the lungs every 6 (six) hours as needed for wheezing.       Marland Kitchen doxepin (SINEQUAN) 10 MG capsule Take 10 mg by mouth at bedtime.       No facility-administered medications prior to visit.    PAST MEDICAL HISTORY: Past Medical History  Diagnosis Date  . Chronic systolic heart failure   . PVC (premature ventricular contraction)    . Bradycardia   . COPD (chronic obstructive pulmonary disease)   . Dyspnea   . Pneumothorax 01/28/2013  . CHF (congestive heart failure)   . GERD (gastroesophageal reflux disease)   . Migraine     PAST SURGICAL HISTORY: Past Surgical History  Procedure Laterality Date  . Tendon repair      right foot  . Shoulder arthroscopy    . Prostate surgery    . Clavicle surgery    . Chest tube insertion Right 01/29/2013    FAMILY HISTORY: Family History  Problem Relation Age of Onset  . Breast cancer Mother   . Uterine cancer Mother     SOCIAL HISTORY:  History   Social History  . Marital Status: Married    Spouse Name: Okey Regal    Number of Children: 2  . Years of Education: MA   Occupational History  . retired Art gallery manager    Social History Main Topics  . Smoking status: Former Smoker -- 1.50 packs/day for 3 years    Types: Cigarettes    Quit date: 12/04/1960  . Smokeless tobacco: Never Used  . Alcohol Use: 6.0 oz/week    10 Shots of liquor per week     Comment: 4 Drinks a week  . Drug Use: No  . Sexually Active: Not on file   Other Topics Concern  . Not on file   Social History Narrative      Pt lives at home with his spouse.   Caffeine Use: 2 cups daily.     PHYSICAL EXAM  Filed Vitals:   04/14/13 1512  BP: 147/79  Pulse: 51  Temp: 98.5 F (36.9 C)  TempSrc: Oral  Height: 5\' 10"  (1.778 m)  Weight: 170 lb (77.111 kg)   Body mass index is 24.39 kg/(m^2).  GENERAL EXAM: Patient is in no distress  CARDIOVASCULAR: Regular rate and rhythm, no murmurs, no carotid bruits  NEUROLOGIC: MENTAL STATUS: awake, alert, language fluent, comprehension intact, naming intact CRANIAL NERVE: pupils equal and reactive to light, visual fields full to confrontation, extraocular muscles intact, no nystagmus, facial sensation and strength symmetric, uvula midline, shoulder shrug symmetric, tongue midline. MOTOR: MILD HEAD AND POSTURAL BUE TREMOR. Normal bulk and tone, full  strength in the BUE, LLE; RIGHT FOOT DF 4/5, EVERSION 4/5, INVERSION 4/5, PLANTAR FLEX 5/5.  SENSORY: VIB < 5 SEC AT BILATERAL TOES COORDINATION: finger-nose-finger, fine finger movements normal REFLEXES: BUE 1, BLE 0. DOWN GOING TOES. GAIT/STATION: MILD RIGHT FOOT DROP GAIT.  DIAGNOSTIC DATA (LABS, IMAGING, TESTING) - I reviewed patient records, labs, notes, testing and imaging myself where available.  Lab Results  Component Value Date   WBC 10.9* 01/29/2013   HGB 15.8 01/29/2013   HCT 45.0 01/29/2013   MCV 89.8 01/29/2013   PLT 245 01/29/2013      Component Value Date/Time   NA 130* 02/03/2013 0655   K 4.5 02/03/2013 0655   CL  95* 02/03/2013 0655   CO2 31 02/03/2013 0655   GLUCOSE 102* 02/03/2013 0655   BUN 32* 02/03/2013 0655   CREATININE 0.92 02/03/2013 0655   CALCIUM 9.0 02/03/2013 0655   GFRNONAA 78* 02/03/2013 0655   GFRAA >90 02/03/2013 0655   No results found for this basename: CHOL,  HDL,  LDLCALC,  LDLDIRECT,  TRIG,  CHOLHDL   No results found for this basename: HGBA1C   No results found for this basename: VITAMINB12   No results found for this basename: TSH    11/05/12 MRI brain - normal 11/05/12 MRA head - normal 11/05/12 MRA neck - Right internal carotid artery has smooth plaque at its origin, with no measurable stenosis by NASCET criteria. Left internal carotid artery and bilateral vertebral arteries have no stenosis.  03/17/13 EMG/NCS - Electrodiagnostic evidence of right L5 radiculopathy with active and chronic findings. No evidence of compressive peroneal neuropathy. No evidence of underlying diffuse polyneuropathy.  03/20/13 MRI lumbar spine 1. At L2-3, L3-4: facet hypertrophy with no spinal stenosis and mild right foraminal narrowing  2. Multi-level disc bulging and facet hypertrophy. 3. No change from MRI on 08/04/11.   ASSESSMENT AND PLAN  79 y.o. year old male  has a past medical history of Chronic systolic heart failure; PVC (premature ventricular contraction);  Bradycardia; COPD (chronic obstructive pulmonary disease); Dyspnea; Pneumothorax (01/28/2013); CHF (congestive heart failure); GERD (gastroesophageal reflux disease); and Migraine. here with right foot dorsiflexion weakness, due to suspected post-traumatic right L5 radiculopathy.  PLAN: 1. Continue PT exercises   Suanne Marker, MD 04/14/2013, 3:44 PM Certified in Neurology, Neurophysiology and Neuroimaging  [facility] [address] [place], [place] [address] [phone]

## 2013-04-14 NOTE — Patient Instructions (Signed)
Continue physical therapy exercises. 

## 2013-05-07 ENCOUNTER — Telehealth: Payer: Self-pay | Admitting: Diagnostic Neuroimaging

## 2013-05-07 NOTE — Telephone Encounter (Signed)
Patient is calling to let us know, he would like to try and come in asap.  He had syncope and dizziness this morning while tending to his garden.  He states this is an extreme incident and needs a call back asap.  His spouse is with him at home.  [phone]

## 2013-05-08 ENCOUNTER — Telehealth: Payer: Self-pay

## 2013-05-08 ENCOUNTER — Ambulatory Visit (INDEPENDENT_AMBULATORY_CARE_PROVIDER_SITE_OTHER): Payer: Medicare Other | Admitting: Diagnostic Neuroimaging

## 2013-05-08 ENCOUNTER — Encounter: Payer: Self-pay | Admitting: Diagnostic Neuroimaging

## 2013-05-08 VITALS — BP 139/74 | HR 59 | Temp 97.0°F | Ht 70.5 in | Wt 168.0 lb

## 2013-05-08 DIAGNOSIS — R42 Dizziness and giddiness: Secondary | ICD-10-CM

## 2013-05-08 DIAGNOSIS — R55 Syncope and collapse: Secondary | ICD-10-CM

## 2013-05-08 NOTE — Telephone Encounter (Signed)
I called and spoke with the patient to inform him that Dr. Richrd Humbles assistance has the message and will call with an appt.

## 2013-05-08 NOTE — Progress Notes (Signed)
[facility]  PATIENT: Brian Hoover DOB: July 19, 1932  REFERRING CLINICIAN: Zachery Dauer HISTORY FROM: patient REASON FOR VISIT: right foot weakness   HISTORICAL  CHIEF COMPLAINT:  Chief Complaint  Patient presents with  . Dizziness    fell  . Gait Problem    balance problems    HISTORY OF PRESENT ILLNESS:   UPDATE 05/08/13: Yesterday patient was in the garden, picking weeds, in a squatted position, and then stood up. He immediately felt dizzy, "woozy" and lightheaded. He began to lose his balance and fall to the ground, but held onto a metal tomato stake, but still fell down. Once he came to the ground he tried to stand up again and collapsed again. He was unable to stand up for several minutes but did not lose consciousness. Ultimately he was able to crawl/walk back to the home and gradually recovered. He did not seek medical attention for this. He did not call his cardiologist or primary care physician yet. He called our office and we were able to work him in today. Today patient is feeling back to normal.  UPDATE 04/14/13: Since last visit patient is doing about the same. He is going to physical therapy. Continues to have intermittent balance difficulty and especially if he bends over and tries to pick something up. He is still catching his right toe on objects when he walks too quickly.  PRIOR HPI (03/11/13): 79 year old right-handed male with history of prostate cancer, cluster headaches, here for evaluation of right foot drop. I previous evaluated patient for dizziness episodes and presyncope, but now patient is here for a new problem.  01/28/2013 patient was at the [facility], stepped out of the shower into locker room area and didn't realize that the floor had just been mopped. Patient took a step and as his feet slipped out from underneath him. Patient fell down towards his right side. He had significant pain and was diagnosed with multiple rib fractures and pneumothorax.  He was admitted to the hospital for chest tube placement. 2 weeks after his fall patient noted some weakness of his right foot. He was unable to dorsiflex his right foot. Patient was diagnosed with a right foot drop and referred to see me for further evaluation.  Patient denies any significant low back pain, numbness in his right leg or radiating pain from his back to his foot. No significant bowel or bladder dysfunction.  PRIOR HPI (10/28/12): 79 year old right-handed male with history of idiopathic cardiomyopathy, frequent PVCs, status post PVC ablation, here for evaluation of 30 minute episode of dizziness.  Patient was on a walk approximately 3 weeks ago, when all of a sudden at 2.5 mile mark, he developed severe dizziness. He denies spinning sensation in, but rather malaise, lightheadedness, balance difficulty. He stumbled and fell down quite hard. He struck his forehead and had an abrasion with laceration. He had a very difficult time walking back home. He was able to walk from house to house, leaning on the garbage can to hold his balance. Eventually he made it back home, and within 50 minutes of coming home he was back to normal. He did not seek medical attention initially for this event. Now has seen PCP and cardiology. Wearing holter monitor now.   REVIEW OF SYSTEMS: Full 14 system review of systems performed and notable only for dizziness balance difficulty and almost passing out.  ALLERGIES: Allergies  Allergen Reactions  . Sulfa Antibiotics   . Sulfamethoxazole     REACTION: hives  HOME MEDICATIONS: Outpatient Prescriptions Prior to Visit  Medication Sig Dispense Refill  . aspirin 81 MG tablet Take 81 mg by mouth daily.      . cholecalciferol (VITAMIN D) 1000 UNITS tablet Take 1,000 Units by mouth daily.      . fexofenadine (ALLEGRA) 180 MG tablet Take 180 mg by mouth daily.      Marland Kitchen lisinopril (PRINIVIL,ZESTRIL) 20 MG tablet Take 20 mg by mouth daily.      . Multiple  Vitamins-Minerals (MULTIVITAMIN WITH MINERALS) tablet Take 1 tablet by mouth daily.      Marland Kitchen OLANZapine (ZYPREXA) 10 MG tablet Take 10 mg by mouth at bedtime.      . polyethylene glycol (MIRALAX / GLYCOLAX) packet 1 packet daily.      . valACYclovir (VALTREX) 500 MG tablet Take 500 mg by mouth daily.       No facility-administered medications prior to visit.    PAST MEDICAL HISTORY: Past Medical History  Diagnosis Date  . Chronic systolic heart failure   . PVC (premature ventricular contraction)   . Bradycardia   . COPD (chronic obstructive pulmonary disease)   . Dyspnea   . Pneumothorax 01/28/2013  . CHF (congestive heart failure)   . GERD (gastroesophageal reflux disease)   . Migraine     PAST SURGICAL HISTORY: Past Surgical History  Procedure Laterality Date  . Tendon repair      right foot  . Shoulder arthroscopy    . Prostate surgery    . Clavicle surgery    . Chest tube insertion Right 01/29/2013    FAMILY HISTORY: Family History  Problem Relation Age of Onset  . Breast cancer Mother   . Uterine cancer Mother     SOCIAL HISTORY:  History   Social History  . Marital Status: Married    Spouse Name: Okey Regal    Number of Children: 2  . Years of Education: MA   Occupational History  . retired Art gallery manager    Social History Main Topics  . Smoking status: Former Smoker -- 1.50 packs/day for 3 years    Types: Cigarettes    Quit date: 12/04/1960  . Smokeless tobacco: Never Used  . Alcohol Use: 6.0 oz/week    10 Shots of liquor per week     Comment: 4 Drinks a week  . Drug Use: No  . Sexually Active: Not on file   Other Topics Concern  . Not on file   Social History Narrative      Pt lives at home with his spouse.   Caffeine Use: 2 cups daily.     PHYSICAL EXAM  Filed Vitals:   05/08/13 1614 05/08/13 1615  BP: 141/74 139/74  Pulse: 55 59  Temp: 97 F (36.1 C)   TempSrc: Oral   Height:  5' 10.5" (1.791 m)  Weight:  168 lb (76.204 kg)   Body mass  index is 23.76 kg/(m^2).  GENERAL EXAM: Patient is in no distress  CARDIOVASCULAR: Regular rate and rhythm, no murmurs, no carotid bruits  NEUROLOGIC: MENTAL STATUS: awake, alert, language fluent, comprehension intact, naming intact CRANIAL NERVE: pupils equal and reactive to light, visual fields full to confrontation, extraocular muscles intact, no nystagmus, facial sensation and strength symmetric, uvula midline, shoulder shrug symmetric, tongue midline. MOTOR: MILD HEAD AND POSTURAL BUE TREMOR. Normal bulk and tone, full strength in the BUE, LLE; RIGHT FOOT DF 4/5, PLANTAR FLEX 5/5.  SENSORY: VIB < 5 SEC AT BILATERAL TOES COORDINATION: finger-nose-finger, fine finger movements  normal REFLEXES: BUE 1, BLE 0. DOWN GOING TOES. GAIT/STATION: MILD RIGHT FOOT DROP GAIT.  DIAGNOSTIC DATA (LABS, IMAGING, TESTING) - I reviewed patient records, labs, notes, testing and imaging myself where available.  Lab Results  Component Value Date   WBC 10.9* 01/29/2013   HGB 15.8 01/29/2013   HCT 45.0 01/29/2013   MCV 89.8 01/29/2013   PLT 245 01/29/2013      Component Value Date/Time   NA 130* 02/03/2013 0655   K 4.5 02/03/2013 0655   CL 95* 02/03/2013 0655   CO2 31 02/03/2013 0655   GLUCOSE 102* 02/03/2013 0655   BUN 32* 02/03/2013 0655   CREATININE 0.92 02/03/2013 0655   CALCIUM 9.0 02/03/2013 0655   GFRNONAA 78* 02/03/2013 0655   GFRAA >90 02/03/2013 0655   No results found for this basename: CHOL,  HDL,  LDLCALC,  LDLDIRECT,  TRIG,  CHOLHDL   No results found for this basename: HGBA1C   No results found for this basename: VITAMINB12   No results found for this basename: TSH    11/05/12 MRI brain - normal 11/05/12 MRA head - normal 11/05/12 MRA neck - Right internal carotid artery has smooth plaque at its origin, with no measurable stenosis by NASCET criteria. Left internal carotid artery and bilateral vertebral arteries have no stenosis.  03/17/13 EMG/NCS - Electrodiagnostic evidence of right L5  radiculopathy with active and chronic findings. No evidence of compressive peroneal neuropathy. No evidence of underlying diffuse polyneuropathy.  03/20/13 MRI lumbar spine 1. At L2-3, L3-4: facet hypertrophy with no spinal stenosis and mild right foraminal narrowing  2. Multi-level disc bulging and facet hypertrophy. 3. No change from MRI on 08/04/11.   ASSESSMENT AND PLAN  79 y.o. year old male  has a past medical history of Chronic systolic heart failure; PVC (premature ventricular contraction); Bradycardia; COPD (chronic obstructive pulmonary disease); Dyspnea; Pneumothorax (01/28/2013); CHF (congestive heart failure); GERD (gastroesophageal reflux disease); and Migraine. here with near syncopal event during squat to stand event. No evidence of seizure or stroke/TIA. Has some ongoing balance issues and exam findings consistent with polyneuropathy (even though EMG/NCS didn't identigy polyneuropathy, but rather right L5 radiculopathy). Will check labs for neuropathy evaluation.   PLAN: 1. Labs (neuropathy eval) 2. F/u with PCP and cardiology for near syncope evaluation   Suanne Marker, MD 05/08/2013, 4:36 PM Certified in Neurology, Neurophysiology and Neuroimaging  [facility] [address], [address] [place], [place] [address] [phone]

## 2013-05-08 NOTE — Telephone Encounter (Signed)
Spoke to pt, sched brief visit for today.

## 2013-05-09 ENCOUNTER — Ambulatory Visit: Payer: Medicare Other | Admitting: Diagnostic Neuroimaging

## 2013-05-09 ENCOUNTER — Telehealth: Payer: Self-pay | Admitting: Diagnostic Neuroimaging

## 2013-05-09 NOTE — Telephone Encounter (Signed)
I called and spoke with the patient about faxing his lab orders to Dr. Pilar Plate office.Marland Kitchen

## 2013-05-14 ENCOUNTER — Telehealth: Payer: Self-pay | Admitting: Diagnostic Neuroimaging

## 2013-05-14 NOTE — Telephone Encounter (Signed)
I LMVM for pt that Vitamin B 12, TSH, HbgA1c ordered.  He is to call back if needed.  Would like them drawn by pcp, Dr. Durward Mallard?

## 2013-05-15 ENCOUNTER — Encounter: Payer: Self-pay | Admitting: Internal Medicine

## 2013-05-15 ENCOUNTER — Encounter: Payer: Self-pay | Admitting: *Deleted

## 2013-05-15 ENCOUNTER — Other Ambulatory Visit: Payer: Self-pay | Admitting: *Deleted

## 2013-05-15 ENCOUNTER — Ambulatory Visit (INDEPENDENT_AMBULATORY_CARE_PROVIDER_SITE_OTHER): Payer: Medicare Other | Admitting: Internal Medicine

## 2013-05-15 VITALS — BP 130/70 | HR 61 | Ht 70.0 in | Wt 171.0 lb

## 2013-05-15 DIAGNOSIS — I4949 Other premature depolarization: Secondary | ICD-10-CM

## 2013-05-15 DIAGNOSIS — R55 Syncope and collapse: Secondary | ICD-10-CM

## 2013-05-15 NOTE — Patient Instructions (Signed)
Your physician has recommended that you have a Loop recorder inserted. Please see the instruction sheet given to you today for more information.

## 2013-05-15 NOTE — Assessment & Plan Note (Signed)
I have discussed the treatment options with the patient and have recommended insertion of an ILR for unexplained syncope. He wishes to proceed.

## 2013-05-15 NOTE — Assessment & Plan Note (Signed)
He has not had more symptomatic PVC"s since his ablation.

## 2013-05-15 NOTE — Progress Notes (Signed)
HPI Brian Hoover returns today for followup. He is a pleasant 79 yo man with a h/o symptomatic PVC's, s/p ablation. In the interim he has had unexplained falls and syncope. He fell out several months ago and fractured his ribs and had a pneumothorax requiring chest tube insertion. He passed out again several weeks ago. His episodes are at times more orthostatic in nature but at other times sudden without much warning. No angina symptoms. No heart failure symptoms. He does have generalized fatigue Allergies  Allergen Reactions  . Sulfa Antibiotics   . Sulfamethoxazole     REACTION: hives     Current Outpatient Prescriptions  Medication Sig Dispense Refill  . aspirin 81 MG tablet Take 81 mg by mouth daily.      . cholecalciferol (VITAMIN D) 1000 UNITS tablet Take 1,000 Units by mouth daily.      . fexofenadine (ALLEGRA) 180 MG tablet Take 180 mg by mouth daily.      Marland Kitchen FLUOCINOLONE ACETONIDE BODY 0.01 % external oil As directed      . lisinopril (PRINIVIL,ZESTRIL) 20 MG tablet Take 20 mg by mouth daily.      . Multiple Vitamins-Minerals (MULTIVITAMIN WITH MINERALS) tablet Take 1 tablet by mouth daily.      Marland Kitchen OLANZapine (ZYPREXA) 10 MG tablet Take 10 mg by mouth at bedtime.      . valACYclovir (VALTREX) 500 MG tablet Take 500 mg by mouth daily.       No current facility-administered medications for this visit.     Past Medical History  Diagnosis Date  . Chronic systolic heart failure   . PVC (premature ventricular contraction)   . Bradycardia   . COPD (chronic obstructive pulmonary disease)   . Dyspnea   . Pneumothorax 01/28/2013  . CHF (congestive heart failure)   . GERD (gastroesophageal reflux disease)   . Migraine     ROS:   All systems reviewed and negative except as noted in the HPI.   Past Surgical History  Procedure Laterality Date  . Tendon repair      right foot  . Shoulder arthroscopy    . Prostate surgery    . Clavicle surgery    . Chest tube insertion Right  01/29/2013     Family History  Problem Relation Age of Onset  . Breast cancer Mother   . Uterine cancer Mother      History   Social History  . Marital Status: Married    Spouse Name: Okey Regal    Number of Children: 2  . Years of Education: MA   Occupational History  . retired Art gallery manager    Social History Main Topics  . Smoking status: Former Smoker -- 1.50 packs/day for 3 years    Types: Cigarettes    Quit date: 12/04/1960  . Smokeless tobacco: Never Used  . Alcohol Use: 6.0 oz/week    10 Shots of liquor per week     Comment: 4 Drinks a week  . Drug Use: No  . Sexually Active: Not on file   Other Topics Concern  . Not on file   Social History Narrative      Pt lives at home with his spouse.   Caffeine Use: 2 cups daily.     BP 130/70  Pulse 61  Ht 5\' 10"  (1.778 m)  Wt 171 lb (77.565 kg)  BMI 24.54 kg/m2  Physical Exam:  Well appearing elderly man, NAD HEENT: Unremarkable Neck:  No JVD, no thyromegally Lungs:  Clear with no wheezes HEART:  Regular rate rhythm, no murmurs, no rubs, no clicks Abd:  soft, positive bowel sounds, no organomegally, no rebound, no guarding Ext:  2 plus pulses, no edema, no cyanosis, no clubbing Skin:  No rashes no nodules Neuro:  CN II through XII intact, motor grossly intact  Assess/Plan:

## 2013-05-26 ENCOUNTER — Encounter (HOSPITAL_COMMUNITY): Payer: Self-pay | Admitting: Pharmacy Technician

## 2013-06-02 ENCOUNTER — Ambulatory Visit (HOSPITAL_COMMUNITY)
Admission: RE | Admit: 2013-06-02 | Discharge: 2013-06-02 | Disposition: A | Discharge status: Home / Self Care | Payer: Medicare Other | Source: Ambulatory Visit | Attending: Internal Medicine | Admitting: Internal Medicine

## 2013-06-02 ENCOUNTER — Encounter (HOSPITAL_COMMUNITY)
Admission: RE | Disposition: A | Discharge status: Home / Self Care | Payer: Self-pay | Source: Ambulatory Visit | Attending: Internal Medicine

## 2013-06-02 DIAGNOSIS — R55 Syncope and collapse: Secondary | ICD-10-CM | POA: Insufficient documentation

## 2013-06-02 DIAGNOSIS — J4489 Other specified chronic obstructive pulmonary disease: Secondary | ICD-10-CM | POA: Insufficient documentation

## 2013-06-02 DIAGNOSIS — K219 Gastro-esophageal reflux disease without esophagitis: Secondary | ICD-10-CM | POA: Insufficient documentation

## 2013-06-02 DIAGNOSIS — I4949 Other premature depolarization: Secondary | ICD-10-CM | POA: Insufficient documentation

## 2013-06-02 DIAGNOSIS — I5022 Chronic systolic (congestive) heart failure: Secondary | ICD-10-CM | POA: Insufficient documentation

## 2013-06-02 DIAGNOSIS — I498 Other specified cardiac arrhythmias: Secondary | ICD-10-CM | POA: Insufficient documentation

## 2013-06-02 DIAGNOSIS — Z882 Allergy status to sulfonamides status: Secondary | ICD-10-CM | POA: Insufficient documentation

## 2013-06-02 DIAGNOSIS — J449 Chronic obstructive pulmonary disease, unspecified: Secondary | ICD-10-CM | POA: Insufficient documentation

## 2013-06-02 DIAGNOSIS — G43909 Migraine, unspecified, not intractable, without status migrainosus: Secondary | ICD-10-CM | POA: Insufficient documentation

## 2013-06-02 DIAGNOSIS — Z87891 Personal history of nicotine dependence: Secondary | ICD-10-CM | POA: Insufficient documentation

## 2013-06-02 DIAGNOSIS — I509 Heart failure, unspecified: Secondary | ICD-10-CM | POA: Insufficient documentation

## 2013-06-02 HISTORY — PX: LOOP RECORDER IMPLANT: SHX5954

## 2013-06-02 HISTORY — DX: Syncope and collapse: R55

## 2013-06-02 HISTORY — PX: LOOP RECORDER IMPLANT: SHX5477

## 2013-06-02 LAB — SURGICAL PCR SCREEN: MRSA, PCR: NEGATIVE

## 2013-06-02 SURGERY — LOOP RECORDER IMPLANT
Anesthesia: LOCAL

## 2013-06-02 MED ORDER — ACETAMINOPHEN 325 MG PO TABS: 325.0000 mg | ORAL_TABLET | ORAL | Status: DC | PRN

## 2013-06-02 MED ORDER — MUPIROCIN 2 % EX OINT
TOPICAL_OINTMENT | Freq: Two times a day (BID) | CUTANEOUS | Status: DC
  Administered 2013-06-02: 1 via NASAL
  Filled 2013-06-02: qty 22

## 2013-06-02 MED ORDER — LIDOCAINE HCL (PF) 1 % IJ SOLN
INTRAMUSCULAR | Status: AC
  Filled 2013-06-02: qty 30

## 2013-06-02 MED ORDER — ONDANSETRON HCL 4 MG/2ML IJ SOLN: 4.0000 mg | Freq: Four times a day (QID) | INTRAMUSCULAR | Status: DC | PRN

## 2013-06-02 MED ORDER — MUPIROCIN 2 % EX OINT
TOPICAL_OINTMENT | CUTANEOUS | Status: AC
  Filled 2013-06-02: qty 22

## 2013-06-02 NOTE — Interval H&P Note (Signed)
History and Physical Interval Note:  06/02/2013 9:50 AM  Brian Hoover  has presented today for surgery, with the diagnosis of syncope  The various methods of treatment have been discussed with the patient and family. After consideration of risks, benefits and other options for treatment, the patient has consented to  Procedure(s): LOOP RECORDER IMPLANT (N/A) as a surgical intervention .  The patient's history has been reviewed, patient examined, no change in status, stable for surgery.  I have reviewed the patient's chart and labs.  Questions were answered to the patient's satisfaction.     Brian Hoover.D.

## 2013-06-02 NOTE — Op Note (Signed)
EP Procedure Note  Procedure: Insertion of an ILR  Indication: unexplained syncope  Findings: After informed consent was obtained, the patient was taken to the diagnostic EP lab in the fasting state. After the usual preparation and draping, 30 cc of lidocain was infiltrated subcutaneously in the infraclavicular region, near the sternum. The Medtronic ILR, [id number] was inserted into the subcutaneous tissue. Benzoin and steri-strips were placed, pressure was held and he was returned to his room in stable condition.  Complications: none immediately  Gregg Taylor,M.D.

## 2013-06-02 NOTE — H&P (View-Only) (Signed)
HPI Mr. Craighead returns today for followup. He is a pleasant 79 yo man with a h/o symptomatic PVC's, s/p ablation. In the interim he has had unexplained falls and syncope. He fell out several months ago and fractured his ribs and had a pneumothorax requiring chest tube insertion. He passed out again several weeks ago. His episodes are at times more orthostatic in nature but at other times sudden without much warning. No angina symptoms. No heart failure symptoms. He does have generalized fatigue Allergies  Allergen Reactions  . Sulfa Antibiotics   . Sulfamethoxazole     REACTION: hives     Current Outpatient Prescriptions  Medication Sig Dispense Refill  . aspirin 81 MG tablet Take 81 mg by mouth daily.      . cholecalciferol (VITAMIN D) 1000 UNITS tablet Take 1,000 Units by mouth daily.      . fexofenadine (ALLEGRA) 180 MG tablet Take 180 mg by mouth daily.      Marland Kitchen FLUOCINOLONE ACETONIDE BODY 0.01 % external oil As directed      . lisinopril (PRINIVIL,ZESTRIL) 20 MG tablet Take 20 mg by mouth daily.      . Multiple Vitamins-Minerals (MULTIVITAMIN WITH MINERALS) tablet Take 1 tablet by mouth daily.      Marland Kitchen OLANZapine (ZYPREXA) 10 MG tablet Take 10 mg by mouth at bedtime.      . valACYclovir (VALTREX) 500 MG tablet Take 500 mg by mouth daily.       No current facility-administered medications for this visit.     Past Medical History  Diagnosis Date  . Chronic systolic heart failure   . PVC (premature ventricular contraction)   . Bradycardia   . COPD (chronic obstructive pulmonary disease)   . Dyspnea   . Pneumothorax 01/28/2013  . CHF (congestive heart failure)   . GERD (gastroesophageal reflux disease)   . Migraine     ROS:   All systems reviewed and negative except as noted in the HPI.   Past Surgical History  Procedure Laterality Date  . Tendon repair      right foot  . Shoulder arthroscopy    . Prostate surgery    . Clavicle surgery    . Chest tube insertion Right  01/29/2013     Family History  Problem Relation Age of Onset  . Breast cancer Mother   . Uterine cancer Mother      History   Social History  . Marital Status: Married    Spouse Name: Okey Regal    Number of Children: 2  . Years of Education: MA   Occupational History  . retired Art gallery manager    Social History Main Topics  . Smoking status: Former Smoker -- 1.50 packs/day for 3 years    Types: Cigarettes    Quit date: 12/04/1960  . Smokeless tobacco: Never Used  . Alcohol Use: 6.0 oz/week    10 Shots of liquor per week     Comment: 4 Drinks a week  . Drug Use: No  . Sexually Active: Not on file   Other Topics Concern  . Not on file   Social History Narrative      Pt lives at home with his spouse.   Caffeine Use: 2 cups daily.     BP 130/70  Pulse 61  Ht 5\' 10"  (1.778 m)  Wt 171 lb (77.565 kg)  BMI 24.54 kg/m2  Physical Exam:  Well appearing elderly man, NAD HEENT: Unremarkable Neck:  No JVD, no thyromegally Lungs:  Clear with no wheezes HEART:  Regular rate rhythm, no murmurs, no rubs, no clicks Abd:  soft, positive bowel sounds, no organomegally, no rebound, no guarding Ext:  2 plus pulses, no edema, no cyanosis, no clubbing Skin:  No rashes no nodules Neuro:  CN II through XII intact, motor grossly intact  Assess/Plan:

## 2013-06-03 ENCOUNTER — Encounter (HOSPITAL_COMMUNITY): Payer: Self-pay | Admitting: *Deleted

## 2013-06-09 ENCOUNTER — Telehealth: Payer: Self-pay | Admitting: Internal Medicine

## 2013-06-09 NOTE — Telephone Encounter (Signed)
Returned call to patient he stated he had a loop recorder 1 week ago and manuel said he would receive a monitor within 2 to 3 days.Stated he called [employer] and was told they have not received a order.Message sent to Dr.Taylor for advice.

## 2013-06-09 NOTE — Telephone Encounter (Signed)
New Prob     Pt would like to speak to nurse regarding his heart rate monitor. Please call.

## 2013-06-10 NOTE — Telephone Encounter (Signed)
Pt states had a loop recorder a week ago and the manual giving to him in the hospital said that he will receive  a monitor 2 to 3 days after. Pt had not received it at this time. Pt called [employer] and they do not have an order for a monitor. I spoke with Leonette Most in the device clinic, he states will find out what  is the problem, and will let me know soon today. Pt is aware that we will call him back as soon as possible.

## 2013-06-10 NOTE — Telephone Encounter (Signed)
Follow Up     Pt calling in following up on heart monitor. Please call.

## 2013-06-10 NOTE — Telephone Encounter (Signed)
I ordered a home monitor through [employer] [facility] and scheduled a remote interrogation for 09/15/13.  Home monitor will be mailed directly to pt.

## 2013-06-23 ENCOUNTER — Encounter: Payer: Self-pay | Admitting: Internal Medicine

## 2013-06-23 ENCOUNTER — Ambulatory Visit (INDEPENDENT_AMBULATORY_CARE_PROVIDER_SITE_OTHER): Payer: Medicare Other | Admitting: *Deleted

## 2013-06-23 DIAGNOSIS — R55 Syncope and collapse: Secondary | ICD-10-CM

## 2013-06-24 NOTE — Progress Notes (Signed)
Pt seen in device clinic for follow up of recently ILR.  Wound well healed.  No redness, swelling, or edema.  Steri-strips removed today.   Device interrogated and found to be functioning normally.  No changes made today. See PaceArt for full details.  Pt denies chest pain, shortness of breath, palpitations, or dizziness.  Pt to follow up with Dr. Ladona Ridgel in 3 months.   Yittel Emrich 06/24/2013 11:03 AM

## 2013-06-25 ENCOUNTER — Other Ambulatory Visit: Payer: Self-pay | Admitting: *Deleted

## 2013-06-25 DIAGNOSIS — J939 Pneumothorax, unspecified: Secondary | ICD-10-CM

## 2013-06-26 ENCOUNTER — Ambulatory Visit (INDEPENDENT_AMBULATORY_CARE_PROVIDER_SITE_OTHER): Payer: Medicare Other | Admitting: Cardiothoracic Surgery

## 2013-06-26 ENCOUNTER — Ambulatory Visit
Admission: RE | Admit: 2013-06-26 | Discharge: 2013-06-26 | Disposition: A | Discharge status: Home / Self Care | Payer: Medicare Other | Source: Ambulatory Visit | Attending: Cardiothoracic Surgery | Admitting: Cardiothoracic Surgery

## 2013-06-26 ENCOUNTER — Encounter: Payer: Self-pay | Admitting: Cardiothoracic Surgery

## 2013-06-26 VITALS — BP 125/73 | HR 60 | Resp 20 | Ht 70.0 in | Wt 167.0 lb

## 2013-06-26 DIAGNOSIS — J939 Pneumothorax, unspecified: Secondary | ICD-10-CM

## 2013-06-26 DIAGNOSIS — J9383 Other pneumothorax: Secondary | ICD-10-CM

## 2013-06-26 DIAGNOSIS — IMO0002 Reserved for concepts with insufficient information to code with codable children: Secondary | ICD-10-CM

## 2013-06-26 DIAGNOSIS — Z09 Encounter for follow-up examination after completed treatment for conditions other than malignant neoplasm: Secondary | ICD-10-CM

## 2013-06-26 DIAGNOSIS — S2239XS Fracture of one rib, unspecified side, sequela: Secondary | ICD-10-CM

## 2013-06-26 NOTE — Progress Notes (Signed)
[address]       [place] [address]             [phone]                          Brian Hoover [facility] Medical Record #[id number] Date of Birth: 1932-05-09  Brian Rud, MD Berenda Morale, MD  Chief Complaint:    Follow Up Visit Status post fall with right rib fractures and pneumothorax  History of Present Illness:       He has had two  syncopal or near syncopal episodes in nov 2013 and in June 2014. He has had neuro evaluation and seen Dr Ladona Ridgel and loop recorder placed.    History  Smoking status  . Former Smoker -- 1.50 packs/day for 3 years  . Types: Cigarettes  . Quit date: 12/04/1960  Smokeless tobacco  . Never Used       Allergies  Allergen Reactions  . Sulfa Antibiotics Hives  . Sulfamethoxazole     REACTION: hives    Current Outpatient Prescriptions  Medication Sig Dispense Refill  . aspirin 81 MG tablet Take 81 mg by mouth daily.      . cholecalciferol (VITAMIN D) 1000 UNITS tablet Take 1,000 Units by mouth daily.      . fexofenadine (ALLEGRA) 180 MG tablet Take 180 mg by mouth daily.      Marland Kitchen FLUOCINOLONE ACETONIDE BODY 0.01 % external oil Apply 1 application topically daily. Apply after shower.      Marland Kitchen lisinopril (PRINIVIL,ZESTRIL) 20 MG tablet Take 20 mg by mouth daily.      . Multiple Vitamins-Minerals (MULTIVITAMIN WITH MINERALS) tablet Take 1 tablet by mouth daily.      Marland Kitchen OLANZapine (ZYPREXA) 10 MG tablet Take 10 mg by mouth at bedtime as needed (as needed to ward off  cluster headaches.).       Marland Kitchen valACYclovir (VALTREX) 500 MG tablet Take 500 mg by mouth daily.       No current facility-administered medications for this visit.       Physical Exam: BP 125/73  Pulse 60  Resp 20  Ht 5\' 10"  (1.778 m)  Wt 167 lb (75.751 kg)  BMI 23.96 kg/m2  SpO2 99%  General appearance: alert, cooperative and appears stated age Neurologic: intact Heart: regular rate and rhythm, S1, S2 normal, no murmur, click, rub or  gallop and normal apical impulse Lungs: clear to auscultation bilaterally and normal percussion bilaterally Abdomen: soft, non-tender; bowel sounds normal; no masses,  no organomegaly Extremities: extremities normal, atraumatic, no cyanosis or edema and Homans sign is negative, no sign of DVT    Diagnostic Studies & Laboratory data:         Recent Radiology Findings: Dg Chest 2 View  06/26/2013   *RADIOLOGY REPORT*  Clinical Data: Follow up pneumothorax, shortness of breath  CHEST - 2 VIEW  Comparison: 03/20/2013  Findings: The heart and pulmonary vascularity are within normal limits.  Postsurgical changes in the left clavicle are seen.  Old rib fractures are noted on the right.  The lungs are clear bilaterally.  No sizable effusion is noted.  A monitoring device is noted of the left chest.  IMPRESSION: No acute abnormality noted.   Original Report Authenticated By: Alcide Clever, M.D.      Recent Labs: Lab Results  Component Value Date   WBC 10.9* 01/29/2013   HGB 15.8 01/29/2013  HCT 45.0 01/29/2013   PLT 245 01/29/2013   GLUCOSE 102* 02/03/2013   NA 130* 02/03/2013   K 4.5 02/03/2013   CL 95* 02/03/2013   CREATININE 0.92 02/03/2013   BUN 32* 02/03/2013   CO2 31 02/03/2013   INR 0.92 01/29/2013      Assessment / Plan:     Overall the patient appears to be doing well in followup after rib fractures and pneumothorax treated with tube thoracostomy. Will see back prn   Wynter Grave B 06/26/2013 10:31 AM

## 2013-07-07 ENCOUNTER — Telehealth: Payer: Self-pay | Admitting: Internal Medicine

## 2013-07-07 NOTE — Telephone Encounter (Signed)
Pt had questions about activator. All questions was answered.

## 2013-07-07 NOTE — Telephone Encounter (Signed)
New Prob     Pt would like to speak to someone regarding his hand held monitor. Please call.

## 2013-07-08 ENCOUNTER — Ambulatory Visit: Payer: Medicare Other | Admitting: Internal Medicine

## 2013-07-23 ENCOUNTER — Ambulatory Visit: Payer: Medicare Other | Attending: Family Medicine | Admitting: Physical Therapy

## 2013-07-23 DIAGNOSIS — M545 Low back pain, unspecified: Secondary | ICD-10-CM | POA: Insufficient documentation

## 2013-07-23 DIAGNOSIS — IMO0001 Reserved for inherently not codable concepts without codable children: Secondary | ICD-10-CM | POA: Insufficient documentation

## 2013-07-23 DIAGNOSIS — R5381 Other malaise: Secondary | ICD-10-CM | POA: Insufficient documentation

## 2013-07-24 ENCOUNTER — Ambulatory Visit: Payer: Medicare Other | Admitting: Physical Therapy

## 2013-07-29 ENCOUNTER — Ambulatory Visit: Payer: Medicare Other | Admitting: Physical Therapy

## 2013-07-31 ENCOUNTER — Ambulatory Visit: Payer: Medicare Other | Admitting: Physical Therapy

## 2013-08-05 ENCOUNTER — Ambulatory Visit: Payer: Medicare Other | Attending: Family Medicine | Admitting: Physical Therapy

## 2013-08-05 DIAGNOSIS — M545 Low back pain, unspecified: Secondary | ICD-10-CM | POA: Insufficient documentation

## 2013-08-05 DIAGNOSIS — R5381 Other malaise: Secondary | ICD-10-CM | POA: Insufficient documentation

## 2013-08-05 DIAGNOSIS — IMO0001 Reserved for inherently not codable concepts without codable children: Secondary | ICD-10-CM | POA: Insufficient documentation

## 2013-08-07 ENCOUNTER — Ambulatory Visit: Payer: Medicare Other | Admitting: Physical Therapy

## 2013-08-11 ENCOUNTER — Telehealth: Payer: Self-pay | Admitting: *Deleted

## 2013-08-11 NOTE — Telephone Encounter (Signed)
Pt called in asking if we received his notifications.  Upon clarifying, he meant his patient assistant symptom indicator.  I asked him to send a manual home transmission.    He stated he got up from a hammock, felt light-headed, then fell.  His symptomatic references were PVCs---no device defined episodes.  All sensing appropriate.

## 2013-08-12 ENCOUNTER — Ambulatory Visit: Payer: Medicare Other | Admitting: Physical Therapy

## 2013-08-14 ENCOUNTER — Ambulatory Visit: Payer: Medicare Other | Admitting: Physical Therapy

## 2013-08-19 ENCOUNTER — Ambulatory Visit: Payer: Medicare Other | Admitting: Physical Therapy

## 2013-08-20 ENCOUNTER — Ambulatory Visit (INDEPENDENT_AMBULATORY_CARE_PROVIDER_SITE_OTHER): Payer: Medicare Other | Admitting: *Deleted

## 2013-08-20 DIAGNOSIS — R55 Syncope and collapse: Secondary | ICD-10-CM

## 2013-08-21 ENCOUNTER — Ambulatory Visit: Payer: Medicare Other | Admitting: Physical Therapy

## 2013-08-26 ENCOUNTER — Ambulatory Visit: Payer: Medicare Other | Admitting: Physical Therapy

## 2013-08-28 ENCOUNTER — Encounter: Payer: Medicare Other | Admitting: Physical Therapy

## 2013-09-02 ENCOUNTER — Ambulatory Visit: Payer: Medicare Other | Admitting: Physical Therapy

## 2013-09-03 ENCOUNTER — Encounter: Payer: Self-pay | Admitting: *Deleted

## 2013-09-04 ENCOUNTER — Encounter: Payer: Medicare Other | Admitting: Physical Therapy

## 2013-09-05 ENCOUNTER — Ambulatory Visit (INDEPENDENT_AMBULATORY_CARE_PROVIDER_SITE_OTHER): Payer: Medicare Other | Admitting: *Deleted

## 2013-09-05 DIAGNOSIS — R42 Dizziness and giddiness: Secondary | ICD-10-CM

## 2013-09-10 ENCOUNTER — Encounter: Payer: Self-pay | Admitting: Internal Medicine

## 2013-09-22 ENCOUNTER — Encounter: Payer: Self-pay | Admitting: Podiatry

## 2013-09-22 ENCOUNTER — Ambulatory Visit (INDEPENDENT_AMBULATORY_CARE_PROVIDER_SITE_OTHER): Payer: Medicare Other | Admitting: Podiatry

## 2013-09-22 VITALS — BP 158/87 | HR 56 | Resp 16

## 2013-09-22 DIAGNOSIS — M722 Plantar fascial fibromatosis: Secondary | ICD-10-CM

## 2013-09-22 DIAGNOSIS — M7660 Achilles tendinitis, unspecified leg: Secondary | ICD-10-CM

## 2013-09-22 NOTE — Progress Notes (Signed)
°  Subjective:    Patient ID: Brian Hoover, male    DOB: 10-31-1932, 79 y.o.   MRN: [id number]  HPI  Hurts when i walk  Review of Systems  HENT: Negative.   Eyes: Negative.   Respiratory: Negative.   Cardiovascular: Negative.   Gastrointestinal: Negative.   Endocrine: Negative.   Genitourinary:       Uses viagra  Musculoskeletal: Negative.   Skin: Negative.   Allergic/Immunologic: Negative.   Neurological: Positive for dizziness.  Hematological: Negative.   Psychiatric/Behavioral: Negative.        Objective:   Physical Exam        Assessment & Plan:

## 2013-09-22 NOTE — Progress Notes (Signed)
Subjective:     Patient ID: Brian Hoover, male   DOB: Mar 03, 1932, 79 y.o.   MRN: [id number]  HPI pain in posterior aspect left Achilles tendon   Review of Systems     Objective:   Physical Exam     Assessment:     Achilles tendinitis left    Plan:     E. Pat treatment performed I was able to get to 3.8 on energy level 2500 shocks 15 frequency

## 2013-09-30 ENCOUNTER — Encounter: Payer: Medicare Other | Admitting: Internal Medicine

## 2013-10-06 LAB — PACEMAKER DEVICE OBSERVATION

## 2013-10-07 ENCOUNTER — Encounter: Payer: Self-pay | Admitting: Internal Medicine

## 2013-10-07 ENCOUNTER — Ambulatory Visit (INDEPENDENT_AMBULATORY_CARE_PROVIDER_SITE_OTHER): Payer: Medicare Other | Admitting: Internal Medicine

## 2013-10-07 VITALS — BP 110/62 | HR 62 | Ht 70.0 in | Wt 181.1 lb

## 2013-10-07 DIAGNOSIS — I4949 Other premature depolarization: Secondary | ICD-10-CM

## 2013-10-07 DIAGNOSIS — R55 Syncope and collapse: Secondary | ICD-10-CM

## 2013-10-07 DIAGNOSIS — I5022 Chronic systolic (congestive) heart failure: Secondary | ICD-10-CM

## 2013-10-07 LAB — PACEMAKER DEVICE OBSERVATION

## 2013-10-07 NOTE — Assessment & Plan Note (Signed)
His symptoms are well compensated. He'll continue his current medical therapy. He'll maintain a low-sodium diet.

## 2013-10-07 NOTE — Assessment & Plan Note (Signed)
He has had no recurrent symptoms. He will undergo watchful waiting.

## 2013-10-07 NOTE — Progress Notes (Signed)
HPI  [name] returns today for followup.he is a very pleasant 79 year old man with a history of symptomatic PVCs, chronic systolic heart failure, and improvement in left ventricular dysfunction, who is status post insertion of an implantable loop recorder, after experiencing a syncopal episode. The patient has had no recurrent syncope. He does note chronic dizziness and loss and balance. Allergies  Allergen Reactions  . Sulfa Antibiotics Hives  . Sulfamethoxazole     REACTION: hives     Current Outpatient Prescriptions  Medication Sig Dispense Refill  . ANDROGEL PUMP 20.25 MG/ACT (1.62%) GEL Apply topically daily.      Marland Kitchen aspirin 81 MG tablet Take 81 mg by mouth daily.      Marland Kitchen azelastine (ASTELIN) 137 MCG/SPRAY nasal spray Place 1 spray into both nostrils 2 (two) times daily.      Marland Kitchen azithromycin (ZITHROMAX) 250 MG tablet Take 1 tablet by mouth daily.      . cholecalciferol (VITAMIN D) 1000 UNITS tablet Take 1,000 Units by mouth daily.      . fexofenadine (ALLEGRA) 180 MG tablet Take 180 mg by mouth as needed.       Marland Kitchen FLUOCINOLONE ACETONIDE BODY 0.01 % external oil Apply 1 application topically as needed. Apply after shower.      Marland Kitchen lisinopril (PRINIVIL,ZESTRIL) 10 MG tablet Take 10 mg by mouth daily.      Marland Kitchen lisinopril (PRINIVIL,ZESTRIL) 20 MG tablet Take 20 mg by mouth daily.      . Multiple Vitamins-Minerals (MULTIVITAMIN WITH MINERALS) tablet Take 1 tablet by mouth daily.      Marland Kitchen OLANZapine (ZYPREXA) 10 MG tablet Take 10 mg by mouth at bedtime as needed (as needed to ward off  cluster headaches.).       Marland Kitchen valACYclovir (VALTREX) 500 MG tablet Take 500 mg by mouth daily.       No current facility-administered medications for this visit.     Past Medical History  Diagnosis Date  . Chronic systolic heart failure   . PVC (premature ventricular contraction)   . Bradycardia   . COPD (chronic obstructive pulmonary disease)   . Dyspnea   . Pneumothorax 01/28/2013  . CHF  (congestive heart failure)   . GERD (gastroesophageal reflux disease)   . Migraine   . Syncope     s/p [employer] ILR implant 05/2013 by Dr Ladona Ridgel    ROS:   All systems reviewed and negative except as noted in the HPI.   Past Surgical History  Procedure Laterality Date  . Tendon repair      right foot  . Shoulder arthroscopy    . Prostate surgery    . Clavicle surgery    . Chest tube insertion Right 01/29/2013  . Loop recorder implant  06/02/2013    [employer] LinQ implanted for syncope by Dr Ladona Ridgel     Family History  Problem Relation Age of Onset  . Breast cancer Mother   . Uterine cancer Mother      History   Social History  . Marital Status: Married    Spouse Name: Okey Regal    Number of Children: 2  . Years of Education: MA   Occupational History  . retired Art gallery manager    Social History Main Topics  . Smoking status: Former Smoker -- 1.50 packs/day for 3 years    Types: Cigarettes    Quit date: 12/04/1960  . Smokeless tobacco: Never Used  . Alcohol Use: 6.0 oz/week    10  Shots of liquor per week     Comment: 4 Drinks a week  . Drug Use: No  . Sexual Activity: Not on file   Other Topics Concern  . Not on file   Social History Narrative      Pt lives at home with his spouse.   Caffeine Use: 2 cups daily.     BP 110/62  Pulse 62  Ht 5\' 10"  (1.778 m)  Wt 181 lb 1.9 oz (82.155 kg)  BMI 25.99 kg/m2  SpO2 95%  Physical Exam:  Well appearing elderly man, NAD HEENT: Unremarkable Neck:  No JVD, no thyromegall Back:  No CVA tenderness Lungs:  Clear with no wheezes, rales, or rhonchi. HEART:  Regular rate rhythm, no murmurs, no rubs, no clicks Abd:  soft, positive bowel sounds, no organomegally, no rebound, no guarding Ext:  2 plus pulses, no edema, no cyanosis, no clubbing Skin:  No rashes no nodules Neuro:  CN II through XII intact, motor grossly intact  Implantable loop recorder interrogation - no sustained arrhythmias or bradycardic episodes.     Assess/Plan:

## 2013-10-07 NOTE — Assessment & Plan Note (Signed)
These are well-controlled. No change in medical therapy.

## 2013-10-07 NOTE — Patient Instructions (Addendum)
Your transmitter will continue to monitor you everyday. Please record all symptoms.  Your physician recommends that you schedule a follow-up appointment in: 1 year with Dr Ladona Ridgel

## 2013-10-08 ENCOUNTER — Encounter: Payer: Self-pay | Admitting: Podiatry

## 2013-10-08 ENCOUNTER — Ambulatory Visit (INDEPENDENT_AMBULATORY_CARE_PROVIDER_SITE_OTHER): Payer: Medicare Other | Admitting: Podiatry

## 2013-10-08 VITALS — BP 130/76 | HR 60 | Resp 12

## 2013-10-08 DIAGNOSIS — M7660 Achilles tendinitis, unspecified leg: Secondary | ICD-10-CM

## 2013-10-08 NOTE — Progress Notes (Signed)
Subjective:     Patient ID: Brian Hoover, male   DOB: 1932-01-23, 79 y.o.   MRN: [id number]  HPI patient presents for E. Pat #2 left Achilles tendon   Review of Systems     Objective:   Physical Exam     Assessment:    tendinitis improving left posterior he    Plan:     E. Pat #2 accomplished 4.8 on energy level

## 2013-10-09 NOTE — Progress Notes (Signed)
Remote ILR received  

## 2013-10-13 ENCOUNTER — Encounter: Payer: Self-pay | Admitting: Internal Medicine

## 2013-10-14 ENCOUNTER — Encounter: Payer: Self-pay | Admitting: Internal Medicine

## 2013-10-15 ENCOUNTER — Encounter: Payer: Self-pay | Admitting: Podiatry

## 2013-10-15 ENCOUNTER — Ambulatory Visit (INDEPENDENT_AMBULATORY_CARE_PROVIDER_SITE_OTHER): Payer: Medicare Other | Admitting: Podiatry

## 2013-10-15 VITALS — BP 119/72 | HR 57 | Resp 16

## 2013-10-15 DIAGNOSIS — M722 Plantar fascial fibromatosis: Secondary | ICD-10-CM

## 2013-10-15 NOTE — Progress Notes (Signed)
Subjective:     Patient ID: Brian Hoover, male   DOB: May 28, 1932, 79 y.o.   MRN: [id number]  HPI patient is found to have improving Achilles tendon   Review of Systems     Objective:   Physical Exam     Assessment:    Achilles tendinitis improved    Plan:     E. Pat performed today level V.0 2500 shocks

## 2013-10-21 ENCOUNTER — Ambulatory Visit (INDEPENDENT_AMBULATORY_CARE_PROVIDER_SITE_OTHER): Payer: Medicare Other | Admitting: *Deleted

## 2013-10-21 DIAGNOSIS — R55 Syncope and collapse: Secondary | ICD-10-CM

## 2013-10-23 ENCOUNTER — Ambulatory Visit: Payer: Medicare Other | Admitting: Cardiology

## 2013-10-23 ENCOUNTER — Ambulatory Visit: Payer: Medicare Other | Admitting: Podiatry

## 2013-10-24 LAB — MDC_IDC_ENUM_SESS_TYPE_REMOTE

## 2013-10-29 ENCOUNTER — Ambulatory Visit: Payer: Medicare Other | Admitting: Cardiology

## 2013-11-03 ENCOUNTER — Ambulatory Visit (INDEPENDENT_AMBULATORY_CARE_PROVIDER_SITE_OTHER): Payer: Medicare Other | Admitting: Cardiology

## 2013-11-03 ENCOUNTER — Encounter: Payer: Self-pay | Admitting: Cardiology

## 2013-11-03 VITALS — BP 132/86 | HR 56 | Ht 70.0 in | Wt 182.0 lb

## 2013-11-03 DIAGNOSIS — I4949 Other premature depolarization: Secondary | ICD-10-CM

## 2013-11-03 DIAGNOSIS — R55 Syncope and collapse: Secondary | ICD-10-CM

## 2013-11-03 DIAGNOSIS — I5022 Chronic systolic (congestive) heart failure: Secondary | ICD-10-CM

## 2013-11-03 DIAGNOSIS — I10 Essential (primary) hypertension: Secondary | ICD-10-CM

## 2013-11-03 NOTE — Progress Notes (Signed)
[address] [place], [place]  [address] Phone: [phone] Fax:  [phone]  Date:  11/03/2013   ID:  IBN STIEF, DOB [birth year], MRN [id number]  PCP:  Rozanna Box, MD   History of Present Illness: Brian Hoover is a 79 y.o. male here for follow up. History of frequent falls. Prior cardiac history includes frequent PVCs which were successfully ablated by Dr. Ladona Ridgel with resolution of symptoms. During those PVCs, he ended up having cardiomyopathy with decreased ejection fraction. At last check, his EF was 45% in 2013.    Previously, was walking 3 miles into walk. Was dizzy, felt completely off-balance, did not lose consciousness. Collapsed, fell tore forehead. He tried to get up but ended up leaning on trash cans. Very concerning to him. He did not feel palpitations previously. He notes that he woke up a few months ago and he noticed that the room was spinning. He has had this off and on for several months. He notes that if he bends down and then gets up quickly, he will have some dizziness.  Event monitor placed a November 2013 did demonstrate short burst of nonsustained ventricular tachycardia. Because of this, he ended up seeing Dr. Sharrell Ku once again. Since he did not have any recurrence of episodes at that time, Dr. Ladona Ridgel endorsed implantable loop recorder. On 10/07/13-interrogation showed no sustained arrhythmias or bradycardic episodes.  He has also seen neurology regarding this matter. unfortunately, he sustained another fall at [facility] fractured ribs, pneumothorax.He slipped on wet shower floor. Clearly no syncope. Since then, right foot drop issues. MRI/MRA back, EMG OK. Perhaps mild difficulty from sacral spine to hip.     Wt Readings from Last 3 Encounters:  11/03/13 182 lb (82.555 kg)  10/07/13 181 lb 1.9 oz (82.155 kg)  06/26/13 167 lb (75.751 kg)     Past Medical History  Diagnosis Date  . Chronic systolic heart failure   . PVC (premature  ventricular contraction)   . Bradycardia   . COPD (chronic obstructive pulmonary disease)   . Dyspnea   . Pneumothorax 01/28/2013  . CHF (congestive heart failure)   . GERD (gastroesophageal reflux disease)   . Migraine   . Syncope     s/p [employer] ILR implant 05/2013 by Dr Ladona Ridgel    Past Surgical History  Procedure Laterality Date  . Tendon repair      right foot  . Shoulder arthroscopy    . Prostate surgery    . Clavicle surgery    . Chest tube insertion Right 01/29/2013  . Loop recorder implant  06/02/2013    [employer] LinQ implanted for syncope by Dr Ladona Ridgel    Current Outpatient Prescriptions  Medication Sig Dispense Refill  . ANDROGEL PUMP 20.25 MG/ACT (1.62%) GEL Apply topically daily.      Marland Kitchen aspirin 81 MG tablet Take 81 mg by mouth daily.      Marland Kitchen azelastine (ASTELIN) 137 MCG/SPRAY nasal spray Place 1 spray into both nostrils 2 (two) times daily.      Marland Kitchen azithromycin (ZITHROMAX) 250 MG tablet Take 1 tablet by mouth daily.      . cholecalciferol (VITAMIN D) 1000 UNITS tablet Take 1,000 Units by mouth daily.      . fexofenadine (ALLEGRA) 180 MG tablet Take 180 mg by mouth as needed.       Marland Kitchen FLUOCINOLONE ACETONIDE BODY 0.01 % external oil Apply 1 application topically as needed. Apply after shower.      Marland Kitchen  lisinopril (PRINIVIL,ZESTRIL) 10 MG tablet Take 10 mg by mouth daily.      . Multiple Vitamins-Minerals (MULTIVITAMIN WITH MINERALS) tablet Take 1 tablet by mouth daily.      Marland Kitchen OLANZapine (ZYPREXA) 10 MG tablet Take 10 mg by mouth at bedtime as needed (as needed to ward off  cluster headaches.).       Marland Kitchen valACYclovir (VALTREX) 500 MG tablet Take 500 mg by mouth daily.       No current facility-administered medications for this visit.    Allergies:    Allergies  Allergen Reactions  . Sulfa Antibiotics Hives  . Sulfamethoxazole     REACTION: hives    Social History:  The patient  reports that he quit smoking about 52 years ago. His smoking use included Cigarettes. He has  a 4.5 pack-year smoking history. He has never used smokeless tobacco. He reports that he drinks about 6.0 ounces of alcohol per week. He reports that he does not use illicit drugs.   ROS:  Please see the history of present illness.   Disequilibrium. No chest pain, no shortness of breath, no further syncopal episodes    PHYSICAL EXAM: VS:  BP 132/86  Pulse 56  Ht 5\' 10"  (1.778 m)  Wt 182 lb (82.555 kg)  BMI 26.11 kg/m2 Well nourished, well developed, in no acute distress HEENT: normal Neck: no JVD Cardiac:  normal S1, S2; RRR; no murmur, one isolated ectopic beat Lungs:  clear to auscultation bilaterally, no wheezing, rhonchi or rales Abd: soft, nontender, no hepatomegaly Ext: no edema Skin: warm and dry Neuro: no focal abnormalities noted  EKG:  None today. Previous showed PVCs.   Echocardiogram 11/13-EF 45%, trace AI  ASSESSMENT AND PLAN:  1. Disequilibrium-he is seeing neurology once again. MRA/MRI of brain have been reassuring. He has tried physical therapy. No recent medication initiations. No tremors. 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 2. Syncope-no further episodes. Implantable loop recorder has been recently interrogated in November of 2014 and showed no adverse arrhythmias. 3. PVCs-one isolated ectopic beat heard on exam today. He is status post radiofrequency ablation by Dr. Ladona Ridgel. 4. Hypertension-currently well controlled. Has previously cut his lisinopril in half to 10 mg. I'm fine with him taking this dose. 5. Chronic systolic heart failure-very well compensated, no shortness of breath. NYHA class I. Recent ejection fraction 45%. ACE inhibitor. No further beta blocker due to bradycardia.  Signed, Donato Schultz, MD [facility]  11/03/2013 11:53 AM

## 2013-11-03 NOTE — Patient Instructions (Signed)
Your physician wants you to follow-up in:   YEAR WITH  DR SKAINS  You will receive a reminder letter in the mail two months in advance. If you don't receive a letter, please call our office to schedule the follow-up appointment. Your physician recommends that you continue on your current medications as directed. Please refer to the Current Medication list given to you today.  

## 2013-11-12 ENCOUNTER — Encounter: Payer: Self-pay | Admitting: Podiatry

## 2013-11-12 ENCOUNTER — Ambulatory Visit (INDEPENDENT_AMBULATORY_CARE_PROVIDER_SITE_OTHER): Payer: Medicare Other | Admitting: Podiatry

## 2013-11-12 VITALS — BP 148/84 | HR 64 | Resp 12

## 2013-11-12 DIAGNOSIS — M7660 Achilles tendinitis, unspecified leg: Secondary | ICD-10-CM

## 2013-11-12 MED ORDER — DICLOFENAC SODIUM 75 MG PO TBEC: 75.0000 mg | DELAYED_RELEASE_TABLET | Freq: Two times a day (BID) | ORAL | Status: DC

## 2013-11-12 NOTE — Progress Notes (Signed)
Subjective:     Patient ID: Brian Hoover, male   DOB: [birth year], 79 y.o.   MRN: [id number]  HPI patient states my Achilles is really bothering me does not seem to be where the treatment was done but within the bulk of the heel and also the outside and the bulk of the Achilles tendon   Review of Systems     Objective:   Physical Exam Neurovascular status intact with no other health changes noted patient is found to have discomfort within the Achilles itself in a more proximal fashion and some discomfort within the heel bone on the lateral side    Assessment:     I do think he said some improvement with E. Pat on the insertional Achilles tendinitis but has more within the tendon itself    Plan:     Advised him of this and recommended immobilization with boot and heat therapy for the next 6 weeks may consider fourth E. Pat treatment depending on response

## 2013-11-17 ENCOUNTER — Ambulatory Visit: Payer: Medicare Other | Admitting: Podiatry

## 2013-11-19 ENCOUNTER — Ambulatory Visit: Payer: Medicare Other | Admitting: Diagnostic Neuroimaging

## 2013-11-21 ENCOUNTER — Ambulatory Visit (INDEPENDENT_AMBULATORY_CARE_PROVIDER_SITE_OTHER): Payer: Medicare Other | Admitting: *Deleted

## 2013-11-21 DIAGNOSIS — R55 Syncope and collapse: Secondary | ICD-10-CM

## 2013-12-01 ENCOUNTER — Encounter: Payer: Self-pay | Admitting: Internal Medicine

## 2013-12-22 ENCOUNTER — Ambulatory Visit (INDEPENDENT_AMBULATORY_CARE_PROVIDER_SITE_OTHER): Payer: Medicare Other | Admitting: *Deleted

## 2013-12-22 DIAGNOSIS — R55 Syncope and collapse: Secondary | ICD-10-CM

## 2013-12-24 ENCOUNTER — Ambulatory Visit: Payer: Medicare Other | Admitting: Podiatry

## 2014-01-07 LAB — MDC_IDC_ENUM_SESS_TYPE_REMOTE

## 2014-01-13 ENCOUNTER — Ambulatory Visit (INDEPENDENT_AMBULATORY_CARE_PROVIDER_SITE_OTHER): Payer: Medicare Other | Admitting: Diagnostic Neuroimaging

## 2014-01-13 ENCOUNTER — Encounter: Payer: Self-pay | Admitting: Diagnostic Neuroimaging

## 2014-01-13 ENCOUNTER — Encounter (INDEPENDENT_AMBULATORY_CARE_PROVIDER_SITE_OTHER): Payer: Self-pay

## 2014-01-13 VITALS — BP 141/71 | HR 61 | Ht 70.0 in | Wt 186.0 lb

## 2014-01-13 DIAGNOSIS — R42 Dizziness and giddiness: Secondary | ICD-10-CM

## 2014-01-13 DIAGNOSIS — G609 Hereditary and idiopathic neuropathy, unspecified: Secondary | ICD-10-CM

## 2014-01-13 DIAGNOSIS — R269 Unspecified abnormalities of gait and mobility: Secondary | ICD-10-CM

## 2014-01-13 NOTE — Progress Notes (Signed)
[facility]  PATIENT: Brian Hoover DOB: 04-11-1932  REFERRING CLINICIAN: Drema Dallas HISTORY FROM: patient REASON FOR VISIT: right foot weakness   HISTORICAL  CHIEF COMPLAINT:  Chief Complaint  Patient presents with  . Follow-up    balance problems    HISTORY OF PRESENT ILLNESS:   UPDATE 01/15/13: Continue to have balance difficulty, especially on uneven surfaces and moving from squat to stand position. No syncope or pre-syncope events. Has been seeing cardiology Beckie Salts and Derl Barrow), and has implantable loop recorder in place.  UPDATE 05/08/13: Yesterday patient was in the garden, picking weeds, in a squatted position, and then stood up. He immediately felt dizzy, "woozy" and lightheaded. He began to lose his balance and fall to the ground, but held onto a metal tomato stake, but still fell down. Once he came to the ground he tried to stand up again and collapsed again. He was unable to stand up for several minutes but did not lose consciousness. Ultimately he was able to crawl/walk back to the home and gradually recovered. He did not seek medical attention for this. He did not call his cardiologist or primary care physician yet. He called our office and we were able to work him in today. Today patient is feeling back to normal.  UPDATE 04/14/13: Since last visit patient is doing about the same. He is going to physical therapy. Continues to have intermittent balance difficulty and especially if he bends over and tries to pick something up. He is still catching his right toe on objects when he walks too quickly.  PRIOR HPI (03/11/13): 80 year old right-handed male with history of prostate cancer, cluster headaches, here for evaluation of right foot drop. I previous evaluated patient for dizziness episodes and presyncope, but now patient is here for a new problem.  01/28/2013 patient was at the [facility], stepped out of the shower into locker room area and didn't realize that  the floor had just been mopped. Patient took a step and as his feet slipped out from underneath him. Patient fell down towards his right side. He had significant pain and was diagnosed with multiple rib fractures and pneumothorax. He was admitted to the hospital for chest tube placement. 2 weeks after his fall patient noted some weakness of his right foot. He was unable to dorsiflex his right foot. Patient was diagnosed with a right foot drop and referred to see me for further evaluation.  Patient denies any significant low back pain, numbness in his right leg or radiating pain from his back to his foot. No significant bowel or bladder dysfunction.  PRIOR HPI (10/28/12): 80 year old right-handed male with history of idiopathic cardiomyopathy, frequent PVCs, status post PVC ablation, here for evaluation of 30 minute episode of dizziness.  Patient was on a walk approximately 3 weeks ago, when all of a sudden at 2.5 mile mark, he developed severe dizziness. He denies spinning sensation in, but rather malaise, lightheadedness, balance difficulty. He stumbled and fell down quite hard. He struck his forehead and had an abrasion with laceration. He had a very difficult time walking back home. He was able to walk from house to house, leaning on the garbage can to hold his balance. Eventually he made it back home, and within 50 minutes of coming home he was back to normal. He did not seek medical attention initially for this event. Now has seen PCP and cardiology. Wearing holter monitor now.   REVIEW OF SYSTEMS: Full 14 system review of  systems performed and notable only for dizziness balance difficulty joint pain.   ALLERGIES: Allergies  Allergen Reactions  . Sulfa Antibiotics Hives  . Sulfamethoxazole     REACTION: hives    HOME MEDICATIONS: Outpatient Prescriptions Prior to Visit  Medication Sig Dispense Refill  . ANDROGEL PUMP 20.25 MG/ACT (1.62%) GEL Apply topically daily.      Marland Kitchen aspirin 81 MG  tablet Take 81 mg by mouth daily.      . cholecalciferol (VITAMIN D) 1000 UNITS tablet Take 1,000 Units by mouth daily.      Marland Kitchen FLUOCINOLONE ACETONIDE BODY 0.01 % external oil Apply 1 application topically as needed. Apply after shower.      Marland Kitchen lisinopril (PRINIVIL,ZESTRIL) 10 MG tablet Take 10 mg by mouth daily.      . Multiple Vitamins-Minerals (MULTIVITAMIN WITH MINERALS) tablet Take 1 tablet by mouth daily.      Marland Kitchen OLANZapine (ZYPREXA) 10 MG tablet Take 10 mg by mouth at bedtime as needed (as needed to ward off  cluster headaches.).       Marland Kitchen valACYclovir (VALTREX) 500 MG tablet Take 500 mg by mouth daily.      Marland Kitchen azelastine (ASTELIN) 137 MCG/SPRAY nasal spray Place 1 spray into both nostrils 2 (two) times daily.      Marland Kitchen azithromycin (ZITHROMAX) 250 MG tablet Take 1 tablet by mouth daily.      . diclofenac (VOLTAREN) 75 MG EC tablet Take 1 tablet (75 mg total) by mouth 2 (two) times daily.  60 tablet  0  . fexofenadine (ALLEGRA) 180 MG tablet Take 180 mg by mouth as needed.        No facility-administered medications prior to visit.    PAST MEDICAL HISTORY: Past Medical History  Diagnosis Date  . Chronic systolic heart failure   . PVC (premature ventricular contraction)   . Bradycardia   . COPD (chronic obstructive pulmonary disease)   . Dyspnea   . Pneumothorax 01/28/2013  . CHF (congestive heart failure)   . GERD (gastroesophageal reflux disease)   . Migraine   . Syncope     s/p Medtronic ILR implant 05/2013 by Dr [name]    PAST SURGICAL HISTORY: Past Surgical History  Procedure Laterality Date  . Tendon repair      right foot  . Shoulder arthroscopy    . Prostate surgery    . Clavicle surgery    . Chest tube insertion Right 01/29/2013  . Loop recorder implant  06/02/2013    Medtronic LinQ implanted for syncope by Dr [name]    FAMILY HISTORY: Family History  Problem Relation Age of Onset  . Breast cancer Mother   . Uterine cancer Mother     SOCIAL HISTORY:  History    Social History  . Marital Status: Married    Spouse Name: [name]    Number of Children: 2  . Years of Education: MA   Occupational History  . retired Chief Financial Officer    Social History Main Topics  . Smoking status: Former Smoker -- 1.50 packs/day for 3 years    Types: Cigarettes    Quit date: 12/04/1960  . Smokeless tobacco: Never Used  . Alcohol Use: 6.0 oz/week    10 Shots of liquor per week     Comment: 4 Drinks a week  . Drug Use: No  . Sexual Activity: Not on file   Other Topics Concern  . Not on file   Social History Narrative      Pt lives  at home with his spouse.   Caffeine Use: 2 cups daily.     PHYSICAL EXAM  Filed Vitals:   01/13/14 1357  BP: 141/71  Pulse: 61  Height: 5\' 10"  (1.778 m)  Weight: 186 lb (84.369 kg)   Body mass index is 26.69 kg/(m^2).  GENERAL EXAM: Patient is in no distress; UPPER LIP SCAR.  CARDIOVASCULAR: Regular rate and rhythm, no murmurs, no carotid bruits  NEUROLOGIC: MENTAL STATUS: awake, alert, language fluent, comprehension intact, naming intact CRANIAL NERVE: pupils equal and reactive to light, visual fields full to confrontation, extraocular muscles intact, no nystagmus, facial sensation and strength symmetric, uvula midline, shoulder shrug symmetric, tongue midline. MOTOR: MILD HEAD AND POSTURAL BUE TREMOR. Normal bulk and tone, full strength in the BUE, BLE SENSORY: VIB < 5 SEC AT BILATERAL TOES COORDINATION: finger-nose-finger, fine finger movements normal REFLEXES: BUE 1, BLE 0. DOWN GOING TOES. GAIT/STATION: STOOPED POSTURE. Kyphoscoliosis. Slow unsteady gait. POSITIVE ROMBERG.  DIAGNOSTIC DATA (LABS, IMAGING, TESTING) - I reviewed patient records, labs, notes, testing and imaging myself where available.  Lab Results  Component Value Date   WBC 10.9* 01/29/2013   HGB 15.8 01/29/2013   HCT 45.0 01/29/2013   MCV 89.8 01/29/2013   PLT 245 01/29/2013      Component Value Date/Time   NA 130* 02/03/2013 0655   K 4.5  02/03/2013 0655   CL 95* 02/03/2013 0655   CO2 31 02/03/2013 0655   GLUCOSE 102* 02/03/2013 0655   BUN 32* 02/03/2013 0655   CREATININE 0.92 02/03/2013 0655   CALCIUM 9.0 02/03/2013 0655   GFRNONAA 78* 02/03/2013 0655   GFRAA >90 02/03/2013 0655   No results found for this basename: CHOL,  HDL,  LDLCALC,  LDLDIRECT,  TRIG,  CHOLHDL   No results found for this basename: HGBA1C   No results found for this basename: VITAMINB12   No results found for this basename: TSH    11/05/12 MRI brain - normal 11/05/12 MRA head - normal 11/05/12 MRA neck - Right internal carotid artery has smooth plaque at its origin, with no measurable stenosis by NASCET criteria. Left internal carotid artery and bilateral vertebral arteries have no stenosis.  03/17/13 EMG/NCS - Electrodiagnostic evidence of right L5 radiculopathy with active and chronic findings. No evidence of compressive peroneal neuropathy. No evidence of underlying diffuse polyneuropathy.  03/20/13 MRI lumbar spine 1. At L2-3, L3-4: facet hypertrophy with no spinal stenosis and mild right foraminal narrowing  2. Multi-level disc bulging and facet hypertrophy. 3. No change from MRI on 08/04/11.   ASSESSMENT AND PLAN  80 y.o. year old male  has a past medical history of Chronic systolic heart failure; PVC (premature ventricular contraction); Bradycardia; COPD (chronic obstructive pulmonary disease); Dyspnea; Pneumothorax (01/28/2013); CHF (congestive heart failure); GERD (gastroesophageal reflux disease); Migraine; and Syncope. here with near syncopal event during squat to stand event. No evidence of seizure or stroke/TIA. Has some ongoing balance issues and exam findings consistent with neuropathy (even though EMG/NCS didn't identify polyneuropathy, but rather right L5 radiculopathy). Will check labs for neuropathy evaluation.   PLAN: 1. Labs (neuropathy eval) 2. Physical therapy referral  Return in about 6 months (around 07/13/2014) for with Charlott Holler or  Ricky Gallery.    Penni Bombard, MD 6/60/6301, 6:01 PM Certified in Neurology, Neurophysiology and Neuroimaging  [facility] [address] [place], [place] [address] [phone]

## 2014-01-14 ENCOUNTER — Ambulatory Visit: Payer: Medicare Other | Admitting: Podiatry

## 2014-01-15 LAB — NEUROPATHY PANEL
A/G RATIO SPE: 1.4 (ref 0.7–2.0)
ALBUMIN ELP: 3.9 g/dL (ref 3.2–5.6)
Alpha 1: 0.1 g/dL (ref 0.1–0.4)
Alpha 2: 0.5 g/dL (ref 0.4–1.2)
Angio Convert Enzyme: <14 U/L — ABNORMAL LOW (ref 14–82)
Anti Nuclear Antibody(ANA): NEGATIVE
BETA: 0.9 g/dL (ref 0.6–1.3)
Gamma Globulin: 1.2 g/dL (ref 0.5–1.6)
Globulin, Total: 2.7 g/dL (ref 2.0–4.5)
M-Spike, %: 0.3 g/dL — ABNORMAL HIGH
RHEUMATOID FACTOR: 8.4 [IU]/mL (ref 0.0–13.9)
Sed Rate: 2 mm/hr (ref 0–30)
TOTAL PROTEIN: 6.6 g/dL (ref 6.0–8.5)
TSH: 3.71 u[IU]/mL (ref 0.450–4.500)
VIT D 25 HYDROXY: 52.8 ng/mL (ref 30.0–100.0)
Vitamin B-12: 740 pg/mL (ref 211–946)

## 2014-01-15 LAB — HEMOGLOBIN A1C
Est. average glucose Bld gHb Est-mCnc: 114 mg/dL
Hgb A1c MFr Bld: 5.6 % (ref 4.8–5.6)

## 2014-01-16 ENCOUNTER — Encounter: Payer: Self-pay | Admitting: Internal Medicine

## 2014-01-19 ENCOUNTER — Ambulatory Visit (INDEPENDENT_AMBULATORY_CARE_PROVIDER_SITE_OTHER): Payer: Medicare Other | Admitting: *Deleted

## 2014-01-19 DIAGNOSIS — R55 Syncope and collapse: Secondary | ICD-10-CM

## 2014-02-04 ENCOUNTER — Encounter: Payer: Self-pay | Admitting: Internal Medicine

## 2014-02-06 ENCOUNTER — Telehealth: Payer: Self-pay | Admitting: Diagnostic Neuroimaging

## 2014-02-06 NOTE — Telephone Encounter (Signed)
Pt called to get the results of the blood work that was done at his last appointment.  Please call.  Thank you

## 2014-02-06 NOTE — Telephone Encounter (Signed)
I called patient with results. Small M-spike noted, but protein electrophoresis not scanned in yet. Also B12 and TSH not results. Otherwise normal.  Will follow up results.  Penni Bombard, MD 7/0/9628, 3:66 PM Certified in Neurology, Neurophysiology and Neuroimaging  [facility] [address], [place], [place] [address] [phone]

## 2014-02-18 LAB — MDC_IDC_ENUM_SESS_TYPE_REMOTE

## 2014-02-23 ENCOUNTER — Ambulatory Visit (INDEPENDENT_AMBULATORY_CARE_PROVIDER_SITE_OTHER): Payer: Medicare Other | Admitting: *Deleted

## 2014-02-23 DIAGNOSIS — R55 Syncope and collapse: Secondary | ICD-10-CM

## 2014-02-23 LAB — MDC_IDC_ENUM_SESS_TYPE_REMOTE

## 2014-02-26 ENCOUNTER — Encounter: Payer: Self-pay | Admitting: Internal Medicine

## 2014-03-11 ENCOUNTER — Encounter: Payer: Self-pay | Admitting: Diagnostic Neuroimaging

## 2014-03-19 ENCOUNTER — Telehealth: Payer: Self-pay | Admitting: *Deleted

## 2014-03-19 NOTE — Telephone Encounter (Signed)
Patient requests lisinopril 90 day refill to be sent to [facility] on [address]. Thanks, MI

## 2014-03-20 MED ORDER — LISINOPRIL 10 MG PO TABS: 10.0000 mg | ORAL_TABLET | Freq: Every day | ORAL | Status: DC

## 2014-03-20 NOTE — Telephone Encounter (Signed)
Rx request for Lisinopril

## 2014-03-25 ENCOUNTER — Ambulatory Visit (INDEPENDENT_AMBULATORY_CARE_PROVIDER_SITE_OTHER): Payer: Medicare Other | Admitting: *Deleted

## 2014-03-25 DIAGNOSIS — R55 Syncope and collapse: Secondary | ICD-10-CM

## 2014-03-25 LAB — MDC_IDC_ENUM_SESS_TYPE_REMOTE
Date Time Interrogation Session: 20150415180441
Zone Setting Detection Interval: 2000 ms
Zone Setting Detection Interval: 3000 ms
Zone Setting Detection Interval: 400 ms

## 2014-04-03 ENCOUNTER — Encounter: Payer: Self-pay | Admitting: Internal Medicine

## 2014-04-09 ENCOUNTER — Encounter: Payer: Self-pay | Admitting: Internal Medicine

## 2014-05-04 ENCOUNTER — Ambulatory Visit (INDEPENDENT_AMBULATORY_CARE_PROVIDER_SITE_OTHER): Payer: Medicare Other | Admitting: *Deleted

## 2014-05-04 DIAGNOSIS — R55 Syncope and collapse: Secondary | ICD-10-CM

## 2014-05-13 ENCOUNTER — Encounter: Payer: Self-pay | Admitting: Internal Medicine

## 2014-05-26 ENCOUNTER — Ambulatory Visit (INDEPENDENT_AMBULATORY_CARE_PROVIDER_SITE_OTHER): Payer: Medicare Other | Admitting: *Deleted

## 2014-05-26 DIAGNOSIS — R55 Syncope and collapse: Secondary | ICD-10-CM

## 2014-05-29 NOTE — Progress Notes (Signed)
Loop recorder 

## 2014-06-01 ENCOUNTER — Ambulatory Visit (INDEPENDENT_AMBULATORY_CARE_PROVIDER_SITE_OTHER): Payer: Medicare Other | Admitting: *Deleted

## 2014-06-01 DIAGNOSIS — R55 Syncope and collapse: Secondary | ICD-10-CM

## 2014-06-19 ENCOUNTER — Encounter: Payer: Self-pay | Admitting: Internal Medicine

## 2014-06-26 ENCOUNTER — Telehealth: Payer: Self-pay | Admitting: Cardiology

## 2014-06-26 NOTE — Telephone Encounter (Signed)
Spoke with pt and informed pt to send a manual transmission b/c monitor has become deactivated. Pt verbalized understanding.

## 2014-07-01 NOTE — Progress Notes (Signed)
Loop recorder 

## 2014-07-09 ENCOUNTER — Ambulatory Visit (INDEPENDENT_AMBULATORY_CARE_PROVIDER_SITE_OTHER): Payer: Medicare Other | Admitting: *Deleted

## 2014-07-09 DIAGNOSIS — R55 Syncope and collapse: Secondary | ICD-10-CM

## 2014-07-09 LAB — MDC_IDC_ENUM_SESS_TYPE_REMOTE

## 2014-07-13 ENCOUNTER — Ambulatory Visit: Payer: Medicare Other | Admitting: Nurse Practitioner

## 2014-07-30 NOTE — Progress Notes (Signed)
Loop recorder 

## 2014-08-03 LAB — MDC_IDC_ENUM_SESS_TYPE_REMOTE: MDC IDC SESS DTM: 20150622040500

## 2014-08-10 LAB — MDC_IDC_ENUM_SESS_TYPE_REMOTE: Date Time Interrogation Session: 20150601040500

## 2014-08-13 ENCOUNTER — Encounter: Payer: Self-pay | Admitting: Internal Medicine

## 2014-08-28 ENCOUNTER — Encounter: Payer: Self-pay | Admitting: Internal Medicine

## 2014-09-14 ENCOUNTER — Encounter: Payer: Self-pay | Admitting: Internal Medicine

## 2014-09-15 ENCOUNTER — Encounter: Payer: Self-pay | Admitting: Internal Medicine

## 2014-09-22 ENCOUNTER — Ambulatory Visit (INDEPENDENT_AMBULATORY_CARE_PROVIDER_SITE_OTHER): Payer: Medicare Other | Admitting: *Deleted

## 2014-09-22 DIAGNOSIS — R55 Syncope and collapse: Secondary | ICD-10-CM

## 2014-09-23 ENCOUNTER — Encounter: Payer: Self-pay | Admitting: Podiatry

## 2014-09-23 ENCOUNTER — Ambulatory Visit (INDEPENDENT_AMBULATORY_CARE_PROVIDER_SITE_OTHER): Payer: Medicare Other | Admitting: Podiatry

## 2014-09-23 VITALS — BP 131/81 | HR 55 | Resp 16

## 2014-09-23 DIAGNOSIS — M7662 Achilles tendinitis, left leg: Secondary | ICD-10-CM

## 2014-09-23 MED ORDER — TRIAMCINOLONE ACETONIDE 10 MG/ML IJ SUSP
10.0000 mg | Freq: Once | INTRAMUSCULAR | Status: AC
  Administered 2014-09-23: 10 mg

## 2014-09-24 NOTE — Progress Notes (Signed)
Subjective:     Patient ID: Brian Hoover, male   DOB: [birth year], 80 y.o.   MRN: [id number]  HPI patient continues to experience discomfort in the posterior aspect of the left heel medial side. He is seen several other physicians and has had laser done has had physical therapy without further relief   Review of Systems     Objective:   Physical Exam Neurovascular status intact with the Achilles tendon functioning well left with discomfort on the lateral side at its insertion into the calcaneus    Assessment:     Continued chronic Achilles tendinitis left    Plan:     Discussed condition and treatment options. I do not think surgery is a good option and he agrees with that and at this time I recommended careful injection with the risk for rupture but hopefully make a difference in his quality of life. Patient wants procedure and today I injected the lateral side 3 mg Kenalog dexamethasone combination 5 mg Xylocaine and advised on reduced activity and discussed that we may add additional topicals depending on response

## 2014-10-02 NOTE — Progress Notes (Signed)
Loop recorder 

## 2014-10-08 LAB — MDC_IDC_ENUM_SESS_TYPE_REMOTE: Date Time Interrogation Session: 20150925040500

## 2014-10-09 ENCOUNTER — Ambulatory Visit: Payer: Medicare Other | Admitting: *Deleted

## 2014-10-09 DIAGNOSIS — R55 Syncope and collapse: Secondary | ICD-10-CM

## 2014-10-16 ENCOUNTER — Encounter: Payer: Medicare Other | Admitting: Internal Medicine

## 2014-10-27 ENCOUNTER — Encounter: Payer: Self-pay | Admitting: Internal Medicine

## 2014-10-27 ENCOUNTER — Ambulatory Visit (INDEPENDENT_AMBULATORY_CARE_PROVIDER_SITE_OTHER): Payer: Medicare Other | Admitting: Internal Medicine

## 2014-10-27 VITALS — BP 122/60 | HR 50 | Ht 70.0 in | Wt 162.2 lb

## 2014-10-27 DIAGNOSIS — R55 Syncope and collapse: Secondary | ICD-10-CM

## 2014-10-27 DIAGNOSIS — I5022 Chronic systolic (congestive) heart failure: Secondary | ICD-10-CM

## 2014-10-27 NOTE — Patient Instructions (Signed)
Your physician wants you to follow-up in: 6 months with Dr Taylor You will receive a reminder letter in the mail two months in advance. If you don't receive a letter, please call our office to schedule the follow-up appointment.  

## 2014-10-27 NOTE — Assessment & Plan Note (Signed)
His symptoms are class 1. He will continue his current meds.

## 2014-10-27 NOTE — Assessment & Plan Note (Signed)
The etiology is still unclear. No obvious explanation based on his ILR. Will follow.

## 2014-10-27 NOTE — Progress Notes (Signed)
HPI  Brian Hoover returns today for followup.he is a very pleasant 80 year old man with a history of symptomatic PVCs, chronic systolic heart failure, and improvement in left ventricular dysfunction, who is status post insertion of an implantable loop recorder, after experiencing a syncopal episode. The patient has had no recurrent syncope. He does note chronic dizziness and loss and balance. He has recently returned from a long cruise. Allergies  Allergen Reactions  . Sulfa Antibiotics Hives    Hives      Current Outpatient Prescriptions  Medication Sig Dispense Refill  . aspirin 81 MG tablet Take 81 mg by mouth daily.    . cholecalciferol (VITAMIN D) 1000 UNITS tablet Take 1,000 Units by mouth daily.    . clomiPHENE (CLOMID) 50 MG tablet Take 50 mg by mouth daily.    Marland Kitchen lisinopril (PRINIVIL,ZESTRIL) 10 MG tablet Take 1 tablet (10 mg total) by mouth daily. 90 tablet 2  . Multiple Vitamins-Minerals (MULTIVITAMIN WITH MINERALS) tablet Take 1 tablet by mouth daily.    Marland Kitchen OLANZapine (ZYPREXA) 10 MG tablet Take 10 mg by mouth at bedtime as needed (as needed to ward off  cluster headaches.).     Marland Kitchen valACYclovir (VALTREX) 500 MG tablet Take 500 mg by mouth daily.     No current facility-administered medications for this visit.     Past Medical History  Diagnosis Date  . Chronic systolic heart failure   . PVC (premature ventricular contraction)   . Bradycardia   . COPD (chronic obstructive pulmonary disease)   . Dyspnea   . Pneumothorax 01/28/2013  . CHF (congestive heart failure)   . GERD (gastroesophageal reflux disease)   . Migraine   . Syncope     s/p Medtronic ILR implant 05/2013 by Dr [name]    ROS:   All systems reviewed and negative except as noted in the HPI.   Past Surgical History  Procedure Laterality Date  . Tendon repair      right foot  . Shoulder arthroscopy    . Prostate surgery    . Clavicle surgery    . Chest tube insertion Right 01/29/2013  . Loop  recorder implant  06/02/2013    Medtronic LinQ implanted for syncope by Dr [name]     Family History  Problem Relation Age of Onset  . Breast cancer Mother   . Uterine cancer Mother      History   Social History  . Marital Status: Married    Spouse Name: [name]    Number of Children: 2  . Years of Education: MA   Occupational History  . retired Chief Financial Officer    Social History Main Topics  . Smoking status: Former Smoker -- 1.50 packs/day for 3 years    Types: Cigarettes    Quit date: 12/04/1960  . Smokeless tobacco: Never Used  . Alcohol Use: 6.0 oz/week    10 Shots of liquor per week     Comment: 4 Drinks a week  . Drug Use: No  . Sexual Activity: Not on file   Other Topics Concern  . Not on file   Social History Narrative      Pt lives at home with his spouse.   Caffeine Use: 2 cups daily.     BP 122/60 mmHg  Pulse 50  Ht 5\' 10"  (1.778 m)  Wt 162 lb 3.2 oz (73.573 kg)  BMI 23.27 kg/m2  Physical Exam:  Well appearing elderly man, NAD HEENT: Unremarkable Neck:  No JVD, no thyromegall Back:  No CVA tenderness Lungs:  Clear with no wheezes, rales, or rhonchi. HEART:  Regular rate rhythm, no murmurs, no rubs, no clicks Abd:  soft, positive bowel sounds, no organomegally, no rebound, no guarding Ext:  2 plus pulses, no edema, no cyanosis, no clubbing Skin:  No rashes no nodules Neuro:  CN II through XII intact, motor grossly intact  Implantable loop recorder interrogation - no sustained arrhythmias or bradycardic episodes. He had a couple episodes registered as atrial fib which look like frequent PVC's and PAC's.    Assess/Plan:

## 2014-10-27 NOTE — Progress Notes (Signed)
Loop recorder 

## 2014-10-27 NOTE — Assessment & Plan Note (Signed)
He is asymptomatic. No change in meds.  

## 2014-10-28 LAB — MDC_IDC_ENUM_SESS_TYPE_INCLINIC
Date Time Interrogation Session: 20151124163551
MDC IDC SET ZONE DETECTION INTERVAL: 3000 ms
Zone Setting Detection Interval: 2000 ms
Zone Setting Detection Interval: 400 ms

## 2014-11-03 ENCOUNTER — Encounter: Payer: Self-pay | Admitting: Cardiology

## 2014-11-03 ENCOUNTER — Ambulatory Visit (INDEPENDENT_AMBULATORY_CARE_PROVIDER_SITE_OTHER): Payer: Medicare Other | Admitting: Cardiology

## 2014-11-03 VITALS — BP 126/82 | HR 51 | Ht 70.0 in | Wt 162.0 lb

## 2014-11-03 DIAGNOSIS — I493 Ventricular premature depolarization: Secondary | ICD-10-CM

## 2014-11-03 DIAGNOSIS — R55 Syncope and collapse: Secondary | ICD-10-CM

## 2014-11-03 DIAGNOSIS — I5022 Chronic systolic (congestive) heart failure: Secondary | ICD-10-CM

## 2014-11-03 DIAGNOSIS — I10 Essential (primary) hypertension: Secondary | ICD-10-CM

## 2014-11-03 NOTE — Progress Notes (Signed)
[facility]. [address] [place], [place]  [address] Phone: [phone] Fax:  [phone]  Date:  11/03/2014   ID:  Brian Hoover, DOB [birth year], MRN [id number]  PCP:  [name], MD   History of Present Illness: Brian Hoover is a 80 y.o. male here for follow up. History of frequent falls. Prior cardiac history includes frequent PVCs which were successfully ablated by Dr. [name] with resolution of symptoms. During those PVCs, he ended up having cardiomyopathy with decreased ejection fraction. At last check, his EF was 45% in 2013.    Previously, was walking 3 miles into walk. Was dizzy, felt completely off-balance, did not lose consciousness. Collapsed, fell tore forehead. He tried to get up but ended up leaning on trash cans. Very concerning to him. He did not feel palpitations previously. He notes that he woke up a few months ago and he noticed that the room was spinning. He has had this off and on for several months. He notes that if he bends down and then gets up quickly, he will have some dizziness.  Event monitor placed a November 2013 did demonstrate short burst of nonsustained ventricular tachycardia. Because of this, he ended up seeing Dr. Crissie Sickles once again. Since he did not have any recurrence of episodes at that time, Dr. [name] endorsed implantable loop recorder. On interrogation showed no sustained arrhythmias or bradycardic episodes.  He has also seen neurology regarding this matter. Unfortunately, he sustained  fall previously at [facility] fractured ribs, pneumothorax. He slipped on wet shower floor. Clearly no syncope. Since then, right foot drop issues. MRI/MRA back, EMG OK. Perhaps mild difficulty from sacral spine to hip.   Despite this, he has been enjoying himself, went on a sailing cruise.    Wt Readings from Last 3 Encounters:  11/03/14 162 lb (73.483 kg)  10/27/14 162 lb 3.2 oz (73.573 kg)  01/13/14 186 lb (84.369 kg)     Past Medical History    Diagnosis Date  . Chronic systolic heart failure   . PVC (premature ventricular contraction)   . Bradycardia   . COPD (chronic obstructive pulmonary disease)   . Dyspnea   . Pneumothorax 01/28/2013  . CHF (congestive heart failure)   . GERD (gastroesophageal reflux disease)   . Migraine   . Syncope     s/p Medtronic ILR implant 05/2013 by Dr [name]    Past Surgical History  Procedure Laterality Date  . Tendon repair      right foot  . Shoulder arthroscopy    . Prostate surgery    . Clavicle surgery    . Chest tube insertion Right 01/29/2013  . Loop recorder implant  06/02/2013    Medtronic LinQ implanted for syncope by Dr [name]    Current Outpatient Prescriptions  Medication Sig Dispense Refill  . aspirin 81 MG tablet Take 81 mg by mouth daily.    . cholecalciferol (VITAMIN D) 1000 UNITS tablet Take 1,000 Units by mouth daily.    . clomiPHENE (CLOMID) 50 MG tablet Take 50 mg by mouth daily.    Marland Kitchen lisinopril (PRINIVIL,ZESTRIL) 10 MG tablet Take 1 tablet (10 mg total) by mouth daily. 90 tablet 2  . Multiple Vitamins-Minerals (MULTIVITAMIN WITH MINERALS) tablet Take 1 tablet by mouth daily.    Marland Kitchen OLANZapine (ZYPREXA) 10 MG tablet Take 10 mg by mouth at bedtime as needed (as needed to ward off  cluster headaches.).     Marland Kitchen valACYclovir (VALTREX)  500 MG tablet Take 500 mg by mouth daily.     No current facility-administered medications for this visit.    Allergies:    Allergies  Allergen Reactions  . Sulfa Antibiotics Hives    Hives     Social History:  The patient  reports that he quit smoking about 53 years ago. His smoking use included Cigarettes. He has a 4.5 pack-year smoking history. He has never used smokeless tobacco. He reports that he drinks about 6.0 oz of alcohol per week. He reports that he does not use illicit drugs.   ROS:  Please see the history of present illness.   Disequilibrium. No chest pain, no shortness of breath, no further syncopal episodes     PHYSICAL EXAM: VS:  BP 126/82 mmHg  Pulse 51  Ht 5\' 10"  (1.778 m)  Wt 162 lb (73.483 kg)  BMI 23.24 kg/m2 Well nourished, well developed, in no acute distress HEENT: normal Neck: no JVD Cardiac:  normal S1, S2; RRR; no murmur, few isolated ectopic beat Lungs:  clear to auscultation bilaterally, no wheezing, rhonchi or rales Abd: soft, nontender, no hepatomegaly Ext: no edema Skin: warm and dry Neuro: no focal abnormalities noted  EKG:  None today. Previous showed PVCs.   Echocardiogram 11/13-EF 45%, trace AI  ASSESSMENT AND PLAN:  1. Disequilibrium- neurology has evaluated. MRA/MRI of brain have been reassuring. He has tried physical therapy. No recent medication initiations. No tremors. 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 He has been able to however go on a recent cruise to [place], sailing vessel, star riggers.  2. Syncope-no further episodes. Implantable loop recorder has been recently interrogated and showed no adverse arrhythmias, Dr. Tanna Furry note reviewed. 3. PVCs-one isolated ectopic beat heard on exam today. He is status post radiofrequency ablation by Dr. [name]. Atrial fibrillation noted on the recorder is PVC/PAC, not atrial fibrillation. 4. Hypertension-currently well controlled. Has previously cut his lisinopril in half to 10 mg. I'm fine with him taking this dose. 5. Chronic systolic heart failure-very well compensated, no shortness of breath. NYHA class I. Recent ejection fraction 45%. ACE inhibitor. No further beta blocker due to bradycardia. 6. 1 year follow up.  Signed, Candee Furbish, MD [facility]  11/03/2014 10:59 AM

## 2014-11-03 NOTE — Patient Instructions (Signed)
The current medical regimen is effective;  continue present plan and medications.  Follow up in 1 year with Dr Skains.  You will receive a letter in the mail 2 months before you are due.  Please call us when you receive this letter to schedule your follow up appointment.  

## 2014-11-12 ENCOUNTER — Encounter: Payer: Self-pay | Admitting: Internal Medicine

## 2014-11-12 ENCOUNTER — Encounter (HOSPITAL_COMMUNITY): Payer: Self-pay | Admitting: Internal Medicine

## 2014-11-18 ENCOUNTER — Telehealth: Payer: Self-pay | Admitting: Cardiology

## 2014-11-18 NOTE — Telephone Encounter (Signed)
Informed pt that transmission was received.  

## 2014-11-23 ENCOUNTER — Ambulatory Visit (INDEPENDENT_AMBULATORY_CARE_PROVIDER_SITE_OTHER): Payer: Medicare Other | Admitting: *Deleted

## 2014-11-23 DIAGNOSIS — R55 Syncope and collapse: Secondary | ICD-10-CM

## 2014-11-24 NOTE — Progress Notes (Signed)
Loop recorder 

## 2014-12-01 ENCOUNTER — Telehealth: Payer: Self-pay | Admitting: Neurology

## 2014-12-01 NOTE — Telephone Encounter (Signed)
We resch appt to see dr Tomi Likens from 01-06-15 to 12-02-14

## 2014-12-02 ENCOUNTER — Encounter: Payer: Self-pay | Admitting: Neurology

## 2014-12-02 ENCOUNTER — Ambulatory Visit (INDEPENDENT_AMBULATORY_CARE_PROVIDER_SITE_OTHER): Payer: Medicare Other | Admitting: Neurology

## 2014-12-02 VITALS — BP 136/70 | HR 68 | Temp 98.0°F | Resp 18 | Ht 70.0 in | Wt 166.6 lb

## 2014-12-02 DIAGNOSIS — G44219 Episodic tension-type headache, not intractable: Secondary | ICD-10-CM

## 2014-12-02 DIAGNOSIS — R2681 Unsteadiness on feet: Secondary | ICD-10-CM

## 2014-12-02 DIAGNOSIS — G44019 Episodic cluster headache, not intractable: Secondary | ICD-10-CM

## 2014-12-02 DIAGNOSIS — R42 Dizziness and giddiness: Secondary | ICD-10-CM

## 2014-12-02 MED ORDER — NORTRIPTYLINE HCL 10 MG PO CAPS: 10.0000 mg | ORAL_CAPSULE | Freq: Every day | ORAL | Status: DC

## 2014-12-02 NOTE — Progress Notes (Signed)
NEUROLOGY CONSULTATION NOTE  Brian Hoover Hoover MRN: [id number] DOB: Jun 09, 1932  Referring provider: Dr. Addison Lank Primary care provider: Dr. Addison Lank  Brian Hoover for consult:  Headache, dizziness, gait instability  HISTORY OF PRESENT ILLNESS: Brian Hoover Hoover is an 80 year old right-handed man with cluster headaches, COPD, HSV II, CHF, GERD, chronic systolic heart failure, PVCs, and history of pheumothorax who presents for headache, gait instability and pre-syncope.  Records, labs and following MRI/MRAs reviewed.  HEADACHE: He has history of cluster headaches for over 20 years as well as other headaches for many years.  He was previously treated at the [facility].  His cluster headaches are described as involving the right eye, 10/10 intensity, sharp stabbing pain, lasting one hour.  It would occur between 1:30 and 2 am in the morning.  He was previously treated with verapamil, which caused constipation.  He had used O2 and nasal sprays.  For many years, he has been taking Zyprexa.  He takes it as needed, only when he feels he has a cluster headache.  His last cluster headache was in 2012.  He also has other type of headache.  It is bi-frontal, non-throbbing ache, about 6-7/10.  It is not associated with other symptoms such as nausea or photophobia.  It can last all day and typically occurs 4 times a month.  Often, he wakes up with it.  Sometimes, it would lead into a cluster, so he has taken Zyprexa recently for it, but it has not helped.  He was advised to start taking the Zyprexa daily.  He takes ASA or ibuprofen sparingly.  They only take the edge off.  Drinking two cups of coffee treats it well.  He drinks coffee daily.     He was previously followed by Dr. Leta Baptist at [facility] for pre-syncope and dizziness.    DIZZINESS/PRESYNCOPE: He has had episodes of feeling "woozy" for 2-3 years.  One time it occurred after a 3 mile walk and another time it occurred when  he stood up while walking in his garden.  He has not lost consciousness.  There is no spinning sensation but he says that the "room swims".  MRI of the brain and MRA of head from 11/05/12 were unremarkable.  MRA of neck showed no stenosis except for some smooth plaque at the origin of the right ICA.  Orthostatic testing was reportedly negative.  He currently has an implantable loop recorder which has not revealed any abnormalities.  GAIT INSTABILITY He was also evaluated for balance issues.  This has been ongoing for 2-3 years.  He denies numbness in the feet or weakness in the legs.  He tore his right achilles heel at that time and had right foot weakness, which improved.  NCV-EMG from 03/17/13 showed evidence of active and chronic right L5 radiculopathy but no evidence of polyneuropathy.  MRI of lumbar spine from 03/20/13 showed multi-level disc bulging and facet hypertrophy but no spinal stenosis or significant foraminal narrowing or cause for L5 radiculopathy.  Neuropathy labs were performed on 01/13/14, revealing small (0.3) M-spike. ANA, Sed Rate, RF, TSH, Hgb A1c, vitamin D 25-hydroxy, and B12 were unremarkable.    PAST MEDICAL HISTORY: Past Medical History  Diagnosis Date  . Chronic systolic heart failure   . PVC (premature ventricular contraction)   . Bradycardia   . COPD (chronic obstructive pulmonary disease)   . Dyspnea   . Pneumothorax 01/28/2013  . CHF (congestive heart failure)   . GERD (gastroesophageal reflux  disease)   . Migraine   . Syncope     s/p Medtronic ILR implant 05/2013 by Dr [name]    PAST SURGICAL HISTORY: Past Surgical History  Procedure Laterality Date  . Tendon repair      right foot  . Shoulder arthroscopy    . Prostate surgery    . Clavicle surgery    . Chest tube insertion Right 01/29/2013  . Loop recorder implant  06/02/2013    Medtronic LinQ implanted for syncope by Dr [name]  . Loop recorder implant N/A 06/02/2013    Procedure: LOOP RECORDER IMPLANT;   Surgeon: [name], MD;  Location: [facility] CATH LAB;  Service: Cardiovascular;  Laterality: N/A;    MEDICATIONS: Current Outpatient Prescriptions on File Prior to Visit  Medication Sig Dispense Refill  . aspirin 81 MG tablet Take 81 mg by mouth daily.    . cholecalciferol (VITAMIN D) 1000 UNITS tablet Take 1,000 Units by mouth daily.    . clomiPHENE (CLOMID) 50 MG tablet Take 50 mg by mouth daily.    Marland Kitchen lisinopril (PRINIVIL,ZESTRIL) 10 MG tablet Take 1 tablet (10 mg total) by mouth daily. 90 tablet 2  . Multiple Vitamins-Minerals (MULTIVITAMIN WITH MINERALS) tablet Take 1 tablet by mouth daily.    Marland Kitchen OLANZapine (ZYPREXA) 10 MG tablet Take 10 mg by mouth at bedtime as needed (as needed to ward off  cluster headaches.).     Marland Kitchen valACYclovir (VALTREX) 500 MG tablet Take 500 mg by mouth daily.     No current facility-administered medications on file prior to visit.    ALLERGIES: Allergies  Allergen Reactions  . Sulfa Antibiotics Hives    Hives     FAMILY HISTORY: Family History  Problem Relation Age of Onset  . Breast cancer Mother   . Uterine cancer Mother   . Rheum arthritis Son     SOCIAL HISTORY: History   Social History  . Marital Status: Married    Spouse Name: [name]    Number of Children: 2  . Years of Education: MA   Occupational History  . retired Chief Financial Officer    Social History Main Topics  . Smoking status: Former Smoker -- 1.50 packs/day for 3 years    Types: Cigarettes    Quit date: 12/04/1960  . Smokeless tobacco: Never Used  . Alcohol Use: 6.0 oz/week    10 Shots of liquor per week     Comment: 4 Drinks a week  . Drug Use: No  . Sexual Activity:    Partners: Female   Other Topics Concern  . Not on file   Social History Narrative      Pt lives at home with his spouse.   Caffeine Use: 2 cups daily.    REVIEW OF SYSTEMS: Constitutional: No fevers, chills, or sweats, no generalized fatigue, change in appetite Eyes: No visual changes, double vision,  eye pain Ear, nose and throat: No hearing loss, ear pain, nasal congestion, sore throat Cardiovascular: No chest pain, palpitations Respiratory:  No shortness of breath at rest or with exertion, wheezes GastrointestinaI: No nausea, vomiting, diarrhea, abdominal pain, fecal incontinence Genitourinary:  No dysuria, urinary retention or frequency Musculoskeletal:  No neck pain, back pain Integumentary: No rash, pruritus, skin lesions Neurological: as above Psychiatric: No depression, insomnia, anxiety Endocrine: No palpitations, fatigue, diaphoresis, mood swings, change in appetite, change in weight, increased thirst Hematologic/Lymphatic:  No anemia, purpura, petechiae. Allergic/Immunologic: no itchy/runny eyes, nasal congestion, recent allergic reactions, rashes  PHYSICAL EXAM: Filed Vitals:  12/02/14 0931  BP: 136/70  Pulse: 68  Temp: 98 F (36.7 C)  Resp: 18   General: No acute distress Head:  Normocephalic/atraumatic Eyes:  fundi unremarkable, without vessel changes, exudates, hemorrhages or papilledema. Neck: supple, no paraspinal tenderness, full range of motion Back: No paraspinal tenderness Heart: regular rate and rhythm Lungs: Clear to auscultation bilaterally. Vascular: No carotid bruits. Neurological Exam: Mental status: alert and oriented to person, place, and time, recent and remote memory intact, fund of knowledge intact, attention and concentration intact, speech fluent and not dysarthric, language intact. Cranial nerves: CN I: not tested CN II: pupils equal, round and reactive to light, visual fields intact, fundi unremarkable, without vessel changes, exudates, hemorrhages or papilledema. CN III, IV, VI:  full range of motion, no nystagmus, no ptosis CN V: facial sensation intact CN VII: upper and lower face symmetric CN VIII: hearing intact CN IX, X: gag intact, uvula midline CN XI: sternocleidomastoid and trapezius muscles intact CN XII: tongue midline Bulk  & Tone: normal, no fasciculations. Motor:  5/5 throughout  Sensation:  Pinprick and vibration intact Deep Tendon Reflexes:  1+ throughout.  Toes downgoing. Finger to nose testing:  No dysmetria Heel to shin:  No dysmetria Gait:  Slow but normal stride.  Unable to walk in tandem. Romberg positive.  IMPRESSION: Cluster headaches.  Stable Tension-type headaches Dizziness.  Unknown etiology Gait instability.   PLAN: 1.  Will start nortriptyline 10mg  at bedtime to address frequent tension-type headaches 2.  I would continue taking Zyprexa on an as-needed basis for clusters 3.  Will repeat NCV-EMG to look for any new evidence of neuropathy.  If unremarkable, consider MRI of cervical spine. 4.  Given the M-spike on previous testing, will repeat SPEP/IFE. 5.  Stop caffeine 6.  Follow up in 3 months.  Thank you for allowing me to take part in the care of this patient.  Metta Clines, DO  CC: [name], MD

## 2014-12-02 NOTE — Patient Instructions (Signed)
1.  Start nortriptyline 10mg  at bedtime.  Call in 4 weeks with update 2.  Take Zyprexa only as needed for the Cluster 3.  Stop caffeine.  Use only for headache as needed. 4.  We will schedule NCV-EMG to look for neuropathy 5.  Check SPEP and IFE 6.  Follow up in 3 months

## 2014-12-03 NOTE — Progress Notes (Signed)
Note faxed.

## 2014-12-07 LAB — SPEP & IFE WITH QIG
ALPHA-2-GLOBULIN: 9 % (ref 7.1–11.8)
Albumin ELP: 60.6 % (ref 55.8–66.1)
Alpha-1-Globulin: 3.4 % (ref 2.9–4.9)
BETA 2: 9.1 % — AB (ref 3.2–6.5)
Beta Globulin: 6.1 % (ref 4.7–7.2)
Gamma Globulin: 11.8 % (ref 11.1–18.8)
IGA: 158 mg/dL (ref 68–379)
IGG (IMMUNOGLOBIN G), SERUM: 1010 mg/dL (ref 650–1600)
IgM, Serum: 130 mg/dL (ref 41–251)
M-Spike, %: 0.24 g/dL
TOTAL PROTEIN, SERUM ELECTROPHOR: 6.9 g/dL (ref 6.0–8.3)

## 2014-12-15 LAB — MDC_IDC_ENUM_SESS_TYPE_REMOTE
MDC IDC SESS DTM: 20151216122757
MDC IDC SET ZONE DETECTION INTERVAL: 3000 ms
Zone Setting Detection Interval: 2000 ms
Zone Setting Detection Interval: 400 ms

## 2014-12-17 ENCOUNTER — Telehealth: Payer: Self-pay | Admitting: *Deleted

## 2014-12-17 NOTE — Telephone Encounter (Signed)
Looks about the same as when drawn by Dr. Leta Baptist last feb.  Will let Dr. Tomi Likens address with him when he gets back and he should be back next week

## 2014-12-17 NOTE — Telephone Encounter (Signed)
Patient requesting the results from his most recent lab results. Looks like it has not been resulted. He would also like a copy of it sent to his home

## 2014-12-22 ENCOUNTER — Ambulatory Visit (INDEPENDENT_AMBULATORY_CARE_PROVIDER_SITE_OTHER): Payer: Self-pay | Admitting: *Deleted

## 2014-12-22 DIAGNOSIS — R55 Syncope and collapse: Secondary | ICD-10-CM

## 2014-12-23 ENCOUNTER — Telehealth: Payer: Self-pay | Admitting: *Deleted

## 2014-12-23 ENCOUNTER — Telehealth: Payer: Self-pay | Admitting: Neurology

## 2014-12-23 NOTE — Telephone Encounter (Signed)
Patient is aware of normal labs no sodium was drawn  with this order

## 2014-12-23 NOTE — Telephone Encounter (Signed)
Pt called wanting to know the results for his blood work he had done on 12/03/15. C/b [phone]

## 2014-12-23 NOTE — Progress Notes (Signed)
Loop recorder 

## 2014-12-23 NOTE — Telephone Encounter (Signed)
Left message for Mr. Yeley to call back to discuss labs

## 2014-12-24 ENCOUNTER — Other Ambulatory Visit: Payer: Self-pay | Admitting: *Deleted

## 2014-12-24 ENCOUNTER — Telehealth: Payer: Self-pay | Admitting: *Deleted

## 2014-12-24 DIAGNOSIS — R799 Abnormal finding of blood chemistry, unspecified: Secondary | ICD-10-CM

## 2014-12-24 NOTE — Telephone Encounter (Signed)
Patient is aware of M spike and is ok iwth a hematology referral  I also told him to expect a call from  Dr Tomi Likens  today

## 2014-12-24 NOTE — Telephone Encounter (Signed)
I spoke with Mr. Kaczmarczyk regarding results of the SPEP/IFE.  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  We will refer him to hematology for their input to determine if this is significant or not.

## 2014-12-26 ENCOUNTER — Other Ambulatory Visit: Payer: Self-pay | Admitting: Neurology

## 2014-12-28 ENCOUNTER — Telehealth: Payer: Self-pay | Admitting: Hematology and Oncology

## 2014-12-28 NOTE — Telephone Encounter (Signed)
S/W PATIENT AND GAVE NP APPT FOR 02/22 @ 1:30 W/DR. [place].

## 2014-12-28 NOTE — Telephone Encounter (Signed)
left message for patient to return call to schedule np appt.  °

## 2015-01-01 ENCOUNTER — Telehealth: Payer: Self-pay | Admitting: *Deleted

## 2015-01-01 NOTE — Telephone Encounter (Signed)
We can increase nortriptyline to 25mg  at bedtime to see if we can improve frequency of headaches.

## 2015-01-01 NOTE — Telephone Encounter (Signed)
Patient call to let Dr. Tomi Likens know that he has had several headaches in the month of January but is a lot better then before the medication

## 2015-01-01 NOTE — Telephone Encounter (Signed)
See below note.

## 2015-01-04 ENCOUNTER — Other Ambulatory Visit: Payer: Self-pay | Admitting: *Deleted

## 2015-01-04 ENCOUNTER — Encounter: Payer: Self-pay | Admitting: Internal Medicine

## 2015-01-04 ENCOUNTER — Telehealth: Payer: Self-pay | Admitting: Neurology

## 2015-01-04 MED ORDER — NORTRIPTYLINE HCL 10 MG PO CAPS: 10.0000 mg | ORAL_CAPSULE | Freq: Every day | ORAL | Status: DC

## 2015-01-04 NOTE — Telephone Encounter (Signed)
Left message for patient to call me back. 

## 2015-01-04 NOTE — Telephone Encounter (Signed)
Instructed patient to stay on the medication if it is helping.  He said that he needed a refill.  Refill sent to pharmacy.

## 2015-01-04 NOTE — Telephone Encounter (Signed)
Pt states that he is returning [place] call please call [phone]

## 2015-01-04 NOTE — Telephone Encounter (Signed)
Brian Hoover - please call patient, see previous documentation / [name].

## 2015-01-04 NOTE — Telephone Encounter (Signed)
Done

## 2015-01-04 NOTE — Telephone Encounter (Signed)
Pt has a question about if Dr Tomi Likens wants him to stay on the Nortiptyline until he sees him again [phone]

## 2015-01-06 ENCOUNTER — Ambulatory Visit: Payer: Medicare Other | Admitting: Neurology

## 2015-01-07 ENCOUNTER — Ambulatory Visit (INDEPENDENT_AMBULATORY_CARE_PROVIDER_SITE_OTHER): Payer: Medicare Other | Admitting: Podiatry

## 2015-01-07 ENCOUNTER — Encounter: Payer: Self-pay | Admitting: Podiatry

## 2015-01-07 VITALS — BP 130/78 | HR 76 | Resp 12 | Ht 70.0 in | Wt 156.0 lb

## 2015-01-07 DIAGNOSIS — M7662 Achilles tendinitis, left leg: Secondary | ICD-10-CM

## 2015-01-07 DIAGNOSIS — L309 Dermatitis, unspecified: Secondary | ICD-10-CM

## 2015-01-08 NOTE — Progress Notes (Signed)
Subjective:     Patient ID: Brian Hoover, male   DOB: Apr 30, 1932, 81 y.o.   MRN: [id number]  HPI patient states my heel is doing pretty well with slight redness still noted and I did have a little bit of irritation of the skin I wanted you did check   Review of Systems     Objective:   Physical Exam Her vascular status intact with significant diminishment of discomfort lateral side left posterior heel with previous treatment and long-term shockwave there is a small patch of red tissue but it is localized with no drainage noted    Assessment:     Mild dermatitis posterior heel with very thin skin in this area improved Achilles tendinitis    Plan:     Do not believe this will be a problem but I did advised on not wearing shoes that irritate the back of the heel or the skin. He will use a low grade cortisone cream on the area and if any drainage or any breakdown were to occur he is to let us know and he will utilize heel lifts and gradually increase activity levels. Will be seen back as needed

## 2015-01-12 LAB — MDC_IDC_ENUM_SESS_TYPE_REMOTE
MDC IDC SET ZONE DETECTION INTERVAL: 2000 ms
MDC IDC SET ZONE DETECTION INTERVAL: 400 ms
Zone Setting Detection Interval: 3000 ms

## 2015-01-14 ENCOUNTER — Ambulatory Visit (INDEPENDENT_AMBULATORY_CARE_PROVIDER_SITE_OTHER): Payer: Medicare Other | Admitting: Neurology

## 2015-01-14 DIAGNOSIS — G44019 Episodic cluster headache, not intractable: Secondary | ICD-10-CM

## 2015-01-14 DIAGNOSIS — R2681 Unsteadiness on feet: Secondary | ICD-10-CM | POA: Diagnosis not present

## 2015-01-14 DIAGNOSIS — R42 Dizziness and giddiness: Secondary | ICD-10-CM

## 2015-01-14 DIAGNOSIS — G44219 Episodic tension-type headache, not intractable: Secondary | ICD-10-CM

## 2015-01-14 NOTE — Procedures (Signed)
[facility]  [place], [address]  [place], [place] [address] Tel: [phone] Fax:  [phone] Test Date:  01/14/2015  Patient: Brian Hoover DOB: 1932-08-01 Physician: Narda Amber, DO  Sex: Male Height: 5\' 10"  Ref Phys: Metta Clines  ID#: [id number] Temp: 33.0C Technician: Laureen Ochs R. NCS T.   Patient Complaints: Patient is an 81 year old male here for evaluation of gait instability and lightheadedness.  NCV & EMG Findings: Extensive electrodiagnostic testing of the right upper and lower extremity shows:  1. All sensory responses including the median, ulnar, radial, sural, and superficial peroneal peroneal sensory nerves are within normal limits. 2. All motor responses including the median, ulnar, peroneal, and tibial nerves are within normal limits. 3. Bilateral H reflex studies are mildly prolonged and most likely due to patient's height. 4. In the arms, chronic motor axon loss changes isolated to the first dorsal interosseous muscle. These findings are too limited for definitive diagnosis. 5. In the legs, chronic motor axon loss changes are seen affecting the L3-L5 myotomes, without accompanied active denervation.  Impression: 1. Chronic L3-L5 radiculopathy affecting the right lower extremity, mild to moderate in degree electrically. 2. There is no evidence of a generalized sensorimotor polyneuropathy.   ___________________________ Narda Amber, DO    Nerve Conduction Studies Anti Sensory Summary Table   Site NR Peak (ms) Norm Peak (ms) P-T Amp (V) Norm P-T Amp  Right Median Anti Sensory (2nd Digit)  33C  Wrist    3.2 <3.8 24.7 >10  Right Radial Anti Sensory (Base 1st Digit)  33C  Wrist    2.4 <2.8 28.1 >10  Right Sup Peroneal Anti Sensory (Ant Lat Mall)  33C  12 cm    3.0 <4.6 5.2 >3  Right Sural Anti Sensory (Lat Mall)  33C  Calf    3.7 <4.6 5.0 >3  Right Ulnar Anti Sensory (5th Digit)  33C  Wrist    3.0 <3.2 18.7 >5   Motor Summary  Table   Site NR Onset (ms) Norm Onset (ms) O-P Amp (mV) Norm O-P Amp Site1 Site2 Delta-0 (ms) Dist (cm) Vel (m/s) Norm Vel (m/s)  Right Median Motor (Abd Poll Brev)  33C  Wrist    3.0 <4.0 5.5 >5 Elbow Wrist 5.4 29.0 54 >50  Elbow    8.4  5.2         Right Peroneal Motor (Ext Dig Brev)  33C  Ankle    4.7 <6.0 2.8 >2.5 B Fib Ankle 7.6 32.0 42 >40  B Fib    12.3  2.3  Poplt B Fib 2.1 10.0 48 >40  Poplt    14.4  2.0         Right Peroneal TA Motor (Tib Ant)  33C  Fib Head    4.1 <4.5 5.6 >3 Poplit Fib Head 1.6 9.0 56 >40  Poplit    5.7  5.4         Right Tibial Motor (Abd Hall Brev)  33C  Ankle    5.5 <6.0 5.4 >4 Knee Ankle 9.5 43.0 45 >40  Knee    15.0  4.1         Right Ulnar Motor (Abd Dig Minimi)  33C  Wrist    2.6 <3.1 7.4 >7 B Elbow Wrist 4.2 23.5 56 >50  B Elbow    6.8  6.2  A Elbow B Elbow 1.8 10.0 56 >50  A Elbow    8.6  6.1  F Wave Studies   NR F-Lat (ms) Lat Norm (ms) L-R F-Lat (ms)  Right Tibial (Mrkrs) (Abd Hallucis)  33C     54.54 <55   Right Ulnar (Mrkrs) (Abd Dig Min)  33C     29.49 <33    H Reflex Studies   NR H-Lat (ms) Lat Norm (ms) L-R H-Lat (ms)  Left Tibial (Gastroc)     38.78 <35 1.23  Right Tibial (Gastroc)  33C     37.55 <35 1.23   EMG   Side Muscle Ins Act Fibs Psw Fasc Number Recrt Dur Dur. Amp Amp. Poly Poly. Comment  Right AntTibialis Nml Nml Nml Nml 1- Rapid Many 1+ Many 1+ Nml Nml N/A  Right AdductorLong Nml Nml Nml Nml 1- Mod-R Few 1+ Some 1+ Nml Nml N/A  Right Flex Dig Long Nml Nml Nml Nml 1- Mod-R Many 1+ Many 1+ Nml Nml N/A  Right RectFemoris Nml Nml Nml Nml 1- Rapid Some 1+ Some 1+ Nml Nml N/A  Right GluteusMed Nml Nml Nml Nml 1- Mod-R Some 1+ Some 1+ Nml Nml N/A  Right 1stDorInt Nml Nml Nml Nml 1- Mod Few 1+ Nml Nml Nml Nml N/A  Right Ext Indicis Nml Nml Nml Nml Nml Nml Nml Nml Nml Nml Nml Nml N/A  Right PronatorTeres Nml Nml Nml Nml Nml Nml Nml Nml Nml Nml Nml Nml N/A  Right Biceps Nml Nml Nml Nml Nml Nml Nml Nml Nml  Nml Nml Nml N/A  Right Triceps Nml Nml Nml Nml Nml Nml Nml Nml Nml Nml Nml Nml N/A  Right Deltoid Nml Nml Nml Nml Nml Nml Nml Nml Nml Nml Nml Nml N/A  Right Gastroc Nml Nml Nml Nml Nml Nml Nml Nml Nml Nml Nml Nml N/A      Waveforms:

## 2015-01-15 ENCOUNTER — Other Ambulatory Visit: Payer: Self-pay | Admitting: *Deleted

## 2015-01-15 ENCOUNTER — Telehealth: Payer: Self-pay | Admitting: *Deleted

## 2015-01-15 DIAGNOSIS — R2681 Unsteadiness on feet: Secondary | ICD-10-CM

## 2015-01-15 NOTE — Telephone Encounter (Signed)
Patient has appt at [facility] 01/27/15 at 4:45 PM for MRI cervical spine

## 2015-01-15 NOTE — Telephone Encounter (Signed)
-----   Message from Dudley Major, DO sent at 01/15/2015  6:56 AM EST ----- Nerve conduction study test shows no evidence of peripheral neuropathy.  I would like to check an MRI of the cervical spine without contrast for evaluation of gait instability. ----- Message -----    From: Alda Berthold, DO    Sent: 01/14/2015   5:14 PM      To: Dudley Major, DO

## 2015-01-20 ENCOUNTER — Encounter: Payer: Self-pay | Admitting: Internal Medicine

## 2015-01-21 ENCOUNTER — Ambulatory Visit (INDEPENDENT_AMBULATORY_CARE_PROVIDER_SITE_OTHER): Payer: Medicare Other | Admitting: *Deleted

## 2015-01-21 DIAGNOSIS — R55 Syncope and collapse: Secondary | ICD-10-CM

## 2015-01-22 NOTE — Progress Notes (Signed)
Loop recorder 

## 2015-01-25 ENCOUNTER — Ambulatory Visit (HOSPITAL_BASED_OUTPATIENT_CLINIC_OR_DEPARTMENT_OTHER): Payer: Medicare Other | Admitting: Hematology and Oncology

## 2015-01-25 ENCOUNTER — Encounter: Payer: Self-pay | Admitting: Hematology and Oncology

## 2015-01-25 ENCOUNTER — Telehealth: Payer: Self-pay | Admitting: Hematology and Oncology

## 2015-01-25 ENCOUNTER — Ambulatory Visit: Payer: Medicare Other

## 2015-01-25 VITALS — BP 147/67 | HR 67 | Temp 98.2°F | Resp 20 | Ht 70.0 in | Wt 167.2 lb

## 2015-01-25 DIAGNOSIS — D472 Monoclonal gammopathy: Secondary | ICD-10-CM | POA: Insufficient documentation

## 2015-01-25 HISTORY — DX: Monoclonal gammopathy: D47.2

## 2015-01-25 NOTE — Telephone Encounter (Signed)
Gave avs & calendar for May. °

## 2015-01-25 NOTE — Progress Notes (Signed)
Checked in new patient with no issues prior to seeing the dr. He has appt card  °

## 2015-01-26 NOTE — Assessment & Plan Note (Signed)
He has multiple neurological manifestation which could be unrelated to monoclonal paraproteinemia. He had 41M spikes ordered last year with very minor changes. Overall, he does not appear to have permanent end organ damage. To make the testing more meaningful, I recommend delaying the workup for another 3 months. When I see him back in 3 months, I will order blood work, 24-hour urine collection and skeletal survey. The patient agreed with the plan of care.

## 2015-01-26 NOTE — Progress Notes (Signed)
[facility] CONSULT NOTE  Patient Care Team: [name], MD as PCP - General (Family Medicine)  CHIEF COMPLAINTS/PURPOSE OF CONSULTATION:  Abnormal M spike, history of falls with balance issues  HISTORY OF PRESENTING ILLNESS:  Brian Hoover 81 y.o. 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 Blood workup from February 2015 detected 0.3 g of M spike. Repeat serum protein electrophoresis in December 2015 showed monoclonal IgG lambda with an M spike measuring 0.24. These blood work were ordered by his neurologist due to the patient having balance issues. He denies significant weakness or peripheral neuropathy. The patient has significant abnormal heart rhythm and have implantation of defibrillator.  He denies history of abnormal bone pain or bone fracture. Patient denies history of recurrent infection or atypical infections such as shingles of meningitis. Denies chills, night sweats, anorexia or abnormal weight loss.  MEDICAL HISTORY:  Past Medical History  Diagnosis Date  . Chronic systolic heart failure   . PVC (premature ventricular contraction)   . Bradycardia   . COPD (chronic obstructive pulmonary disease)   . Dyspnea   . Pneumothorax 01/28/2013  . CHF (congestive heart failure)   . GERD (gastroesophageal reflux disease)   . Migraine   . Syncope     s/p Medtronic ILR implant 05/2013 by Dr [name]  . MGUS (monoclonal gammopathy of unknown significance) 01/25/2015    SURGICAL HISTORY: Past Surgical History  Procedure Laterality Date  . Tendon repair      right foot  . Shoulder arthroscopy    . Prostate surgery    . Clavicle surgery    . Chest tube insertion Right 01/29/2013  . Loop recorder implant  06/02/2013    Medtronic LinQ implanted for syncope by Dr [name]  . Loop recorder implant N/A 06/02/2013    Procedure: LOOP RECORDER IMPLANT;  Surgeon: [name], MD;  Location: [facility] CATH LAB;  Service: Cardiovascular;   Laterality: N/A;    SOCIAL HISTORY: History   Social History  . Marital Status: Married    Spouse Name: [name]  . Number of Children: 2  . Years of Education: MA   Occupational History  . retired Chief Financial Officer    Social History Main Topics  . Smoking status: Former Smoker -- 1.50 packs/day for 3 years    Types: Cigarettes    Quit date: 12/04/1960  . Smokeless tobacco: Never Used  . Alcohol Use: 6.0 oz/week    10 Shots of liquor per week     Comment: 4 Drinks a week  . Drug Use: No  . Sexual Activity:    Partners: Female   Other Topics Concern  . Not on file   Social History Narrative      Pt lives at home with his spouse.   Caffeine Use: 2 cups daily.    FAMILY HISTORY: Family History  Problem Relation Age of Onset  . Breast cancer Mother   . Uterine cancer Mother   . Rheum arthritis Son     ALLERGIES:  is allergic to sulfa antibiotics.  MEDICATIONS:  Current Outpatient Prescriptions  Medication Sig Dispense Refill  . aspirin 81 MG tablet Take 81 mg by mouth daily.    . cholecalciferol (VITAMIN D) 1000 UNITS tablet Take 1,000 Units by mouth daily.    . clomiPHENE (CLOMID) 50 MG tablet Take 50 mg by mouth daily.    Marland Kitchen lisinopril (PRINIVIL,ZESTRIL) 10 MG tablet Take 1 tablet (10 mg total) by mouth daily.  90 tablet 2  . Multiple Vitamins-Minerals (MULTIVITAMIN WITH MINERALS) tablet Take 1 tablet by mouth daily.    . nortriptyline (PAMELOR) 10 MG capsule Take 1 capsule (10 mg total) by mouth at bedtime. 30 capsule 5  . OLANZapine (ZYPREXA) 10 MG tablet Take 10 mg by mouth at bedtime as needed (as needed to ward off  cluster headaches.).     Marland Kitchen sildenafil (VIAGRA) 100 MG tablet Take 100 mg by mouth daily as needed for erectile dysfunction.    . valACYclovir (VALTREX) 500 MG tablet Take 500 mg by mouth daily.     No current facility-administered medications for this visit.    REVIEW OF SYSTEMS:   Eyes: Denies blurriness of vision, double vision or watery eyes Ears,  nose, mouth, throat, and face: Denies mucositis or sore throat Respiratory: Denies cough, dyspnea or wheezes Cardiovascular: Denies palpitation, chest discomfort or lower extremity swelling Gastrointestinal:  Denies nausea, heartburn or change in bowel habits Skin: Denies abnormal skin rashes Lymphatics: Denies new lymphadenopathy or easy bruising Neurological:Denies numbness, tingling or new weaknesses Behavioral/Psych: Mood is stable, no new changes  All other systems were reviewed with the patient and are negative.  PHYSICAL EXAMINATION: ECOG PERFORMANCE STATUS: 0 - Asymptomatic  Filed Vitals:   01/25/15 1359  BP: 147/67  Pulse: 67  Temp: 98.2 F (36.8 C)  Resp: 20   Filed Weights   01/25/15 1359  Weight: 167 lb 3.2 oz (75.841 kg)    GENERAL:alert, no distress and comfortable SKIN: skin color, texture, turgor are normal, no rashes or significant lesions EYES: normal, conjunctiva are pink and non-injected, sclera clear OROPHARYNX:no exudate, no erythema and lips, buccal mucosa, and tongue normal  NECK: supple, thyroid normal size, non-tender, without nodularity LYMPH:  no palpable lymphadenopathy in the cervical, axillary or inguinal LUNGS: clear to auscultation and percussion with normal breathing effort HEART: regular rate & rhythm and no murmurs and no lower extremity edema ABDOMEN:abdomen soft, non-tender and normal bowel sounds Musculoskeletal:no cyanosis of digits and no clubbing  PSYCH: alert & oriented x 3 with fluent speech NEURO: no focal motor/sensory deficits  LABORATORY DATA:  I have reviewed the data as listed Lab Results  Component Value Date   WBC 10.9* 01/29/2013   HGB 15.8 01/29/2013   HCT 45.0 01/29/2013   MCV 89.8 01/29/2013   PLT 245 01/29/2013    ASSESSMENT & PLAN:  MGUS (monoclonal gammopathy of unknown significance) He has multiple neurological manifestation which could be unrelated to monoclonal paraproteinemia. He had 14M spikes ordered  last year with very minor changes. Overall, he does not appear to have permanent end organ damage. To make the testing more meaningful, I recommend delaying the workup for another 3 months. When I see him back in 3 months, I will order blood work, 24-hour urine collection and skeletal survey. The patient agreed with the plan of care.      Orders Placed This Encounter  Procedures  . DG Bone Survey Met    Standing Status: Future     Number of Occurrences:      Standing Expiration Date: 03/26/2016    Order Specific Question:  Reason for Exam (SYMPTOM  OR DIAGNOSIS REQUIRED)    Answer:  staging myeloma    Order Specific Question:  Preferred imaging location?    Answer:  [facility]  . Comprehensive metabolic panel    Standing Status: Future     Number of Occurrences:      Standing Expiration Date: 03/26/2016  .  CBC with Differential/Platelet    Standing Status: Future     Number of Occurrences:      Standing Expiration Date: 03/26/2016  . Lactate dehydrogenase    Standing Status: Future     Number of Occurrences:      Standing Expiration Date: 03/26/2016  . Beta 2 microglobulin, serum    Standing Status: Future     Number of Occurrences:      Standing Expiration Date: 03/26/2016  . IFE, Urine (with Tot Prot)    Standing Status: Future     Number of Occurrences:      Standing Expiration Date: 03/26/2016  . Protein Electro, 24-Hour Urine    Standing Status: Future     Number of Occurrences:      Standing Expiration Date: 03/26/2016  . Kappa/lambda light chains    Standing Status: Future     Number of Occurrences:      Standing Expiration Date: 03/26/2016  . Immunofixation interpretive, urine    Standing Status: Future     Number of Occurrences:      Standing Expiration Date: 03/26/2016  . SPEP & IFE with QIG    Standing Status: Future     Number of Occurrences:      Standing Expiration Date: 03/26/2016    All questions were answered. The patient knows to call the  clinic with any problems, questions or concerns. I spent 40 minutes counseling the patient face to face. The total time spent in the appointment was 55 minutes and more than 50% was on counseling.     [place], [place], MD 01/26/2015 7:51 AM

## 2015-01-27 ENCOUNTER — Ambulatory Visit (HOSPITAL_COMMUNITY)
Admission: RE | Admit: 2015-01-27 | Discharge: 2015-01-27 | Disposition: A | Discharge status: Home / Self Care | Payer: Medicare Other | Source: Ambulatory Visit | Attending: Neurology | Admitting: Neurology

## 2015-01-27 DIAGNOSIS — R2681 Unsteadiness on feet: Secondary | ICD-10-CM | POA: Diagnosis present

## 2015-01-27 DIAGNOSIS — R51 Headache: Secondary | ICD-10-CM | POA: Insufficient documentation

## 2015-01-28 ENCOUNTER — Telehealth: Payer: Self-pay | Admitting: *Deleted

## 2015-01-28 NOTE — Telephone Encounter (Signed)
-----   Message from Dudley Major, DO sent at 01/28/2015  6:21 AM EST ----- MRI of cervical spine does not show any cause for balance problems ----- Message -----    From: Rad Results In Interface    Sent: 01/27/2015   6:40 PM      To: Dudley Major, DO

## 2015-01-28 NOTE — Telephone Encounter (Signed)
Patient is aware of normal C spine MRI

## 2015-02-02 LAB — MDC_IDC_ENUM_SESS_TYPE_REMOTE

## 2015-02-10 ENCOUNTER — Telehealth: Payer: Self-pay | Admitting: Neurology

## 2015-02-10 NOTE — Telephone Encounter (Signed)
As discussed previously, I have no explanation for his dizzy spells so I would have him direct that to his PCP

## 2015-02-10 NOTE — Telephone Encounter (Signed)
Pt called wanting to give a update regarding his headaches. Pt go dizzy and collapsed. Please call pt # [phone] Pt would like the results for his MRI.

## 2015-02-10 NOTE — Telephone Encounter (Signed)
Patient called stating his headache are a lot better however he still having them some . He wanted to let us know that last week he had 1 episode of  Dizzy with and collapsed . Please advise.

## 2015-02-10 NOTE — Telephone Encounter (Signed)
Patient was advised to inform PCP of episode being dizzy and collapse

## 2015-02-12 ENCOUNTER — Encounter: Payer: Self-pay | Admitting: Internal Medicine

## 2015-02-19 ENCOUNTER — Ambulatory Visit (INDEPENDENT_AMBULATORY_CARE_PROVIDER_SITE_OTHER): Payer: Medicare Other | Admitting: *Deleted

## 2015-02-19 DIAGNOSIS — R55 Syncope and collapse: Secondary | ICD-10-CM | POA: Diagnosis not present

## 2015-02-19 LAB — MDC_IDC_ENUM_SESS_TYPE_REMOTE

## 2015-02-26 NOTE — Progress Notes (Signed)
Loop recorder 

## 2015-03-03 ENCOUNTER — Ambulatory Visit (INDEPENDENT_AMBULATORY_CARE_PROVIDER_SITE_OTHER): Payer: Medicare Other | Admitting: Neurology

## 2015-03-03 ENCOUNTER — Encounter: Payer: Self-pay | Admitting: Neurology

## 2015-03-03 VITALS — BP 120/70 | HR 60 | Temp 97.7°F | Resp 16 | Ht 70.0 in | Wt 165.0 lb

## 2015-03-03 DIAGNOSIS — R42 Dizziness and giddiness: Secondary | ICD-10-CM

## 2015-03-03 DIAGNOSIS — R413 Other amnesia: Secondary | ICD-10-CM | POA: Diagnosis not present

## 2015-03-03 DIAGNOSIS — R51 Headache: Secondary | ICD-10-CM

## 2015-03-03 DIAGNOSIS — G8929 Other chronic pain: Secondary | ICD-10-CM

## 2015-03-03 DIAGNOSIS — Z8669 Personal history of other diseases of the nervous system and sense organs: Secondary | ICD-10-CM

## 2015-03-03 DIAGNOSIS — D472 Monoclonal gammopathy: Secondary | ICD-10-CM

## 2015-03-03 MED ORDER — NORTRIPTYLINE HCL 25 MG PO CAPS: 25.0000 mg | ORAL_CAPSULE | Freq: Every day | ORAL | Status: DC

## 2015-03-03 NOTE — Progress Notes (Signed)
NEUROLOGY FOLLOW UP OFFICE NOTE  Brian Hoover [id number]  HISTORY OF PRESENT ILLNESS: Brian Hoover is an 81 year old right-handed man with cluster headaches, COPD, HSV II, CHF, GERD, chronic systolic heart failure, PVCs, and history of pheumothorax who follows up for headache, gait instability and pre-syncope.  Records and labs reviewed.  He is accompanied by his wife who provides some history.  HEADACHE: Update: He was started on nortriptyline 10mg  daily for tension-type headaches.  He stopped because it was ineffective and thought it was affecting his memory.  However, in retrospect, his memory problems predate the nortriptyline.  He notes sometimes disorientation on the road and forgetting how to get back home.  The headaches, he says, feel like a pressure and occasionally with very mild nausea.  He will sometimes take ASA or Zyprexa to treat it.  History: He has history of cluster headaches for over 20 years as well as other headaches for many years.  He was previously treated at the [facility].  His cluster headaches are described as involving the right eye, 10/10 intensity, sharp stabbing pain, lasting one hour.  It would occur between 1:30 and 2 am in the morning.  He was previously treated with verapamil, which caused constipation.  He had used O2 and nasal sprays.  For many years, he has been taking Zyprexa.  He takes it as needed, only when he feels he has a cluster headache.  His last cluster headache was in 2012.  He also has other type of headache.  It is bi-frontal, non-throbbing ache, about 6-7/10.  It is not associated with other symptoms such as nausea or photophobia.  It can last all day and typically occurs 4 times a month.  Often, he wakes up with it.  Sometimes, it would lead into a cluster, so he has taken Zyprexa recently for it, but it has not helped.  He was advised to start taking the Zyprexa daily.  He takes ASA or ibuprofen sparingly.  They only take  the edge off.  Drinking two cups of coffee treats it well.  He drinks coffee daily.    DIZZINESS/PRESYNCOPE: He has had episodes of feeling "woozy" for 2-3 years.  One time it occurred after a 3 mile walk and another time it occurred when he stood up while walking in his garden.  He has not lost consciousness.  There is no spinning sensation but he says that the "room swims".  MRI of the brain and MRA of head from 11/05/12 were unremarkable.  MRA of neck showed no stenosis except for some smooth plaque at the origin of the right ICA.  Orthostatic testing was reportedly negative.  He also reports that he sometimes feels very cold in warm weather.  GAIT INSTABILITY Update: Repeat testing of SPEP/IFE from December again showed an M-Spike with abnormal IgA lambda, consistent with MGUS.  He was referred to Dr. Alvy Bimler of Heme/Onc.  It was recommended to monitor for now.  A NCV-EMG performed on 01/14/15 showed chronic right L3-L5 radiculopathy but no evidence of generalized sensorimotor polyneuropathy.  To further evaluate gait instability, an MRI of the cervical spine was performed on 01/27/15, which did not reveal spinal stenosis.  History: He was also evaluated for balance issues.  This has been ongoing for 2-3 years.  He denies numbness in the feet or weakness in the legs.  He tore his right achilles heel at that time and had right foot weakness, which improved.  NCV-EMG from  03/17/13 showed evidence of active and chronic right L5 radiculopathy but no evidence of polyneuropathy.  MRI of lumbar spine from 03/20/13 showed multi-level disc bulging and facet hypertrophy but no spinal stenosis or significant foraminal narrowing or cause for L5 radiculopathy.  Neuropathy labs were performed on 01/13/14, revealing small (0.3) M-spike. ANA, Sed Rate, RF, TSH, Hgb A1c, vitamin D 25-hydroxy, and B12 were unremarkable.   PAST MEDICAL HISTORY: Past Medical History  Diagnosis Date  . Chronic systolic heart failure   .  PVC (premature ventricular contraction)   . Bradycardia   . COPD (chronic obstructive pulmonary disease)   . Dyspnea   . Pneumothorax 01/28/2013  . CHF (congestive heart failure)   . GERD (gastroesophageal reflux disease)   . Migraine   . Syncope     s/p Medtronic ILR implant 05/2013 by Dr [name]  . MGUS (monoclonal gammopathy of unknown significance) 01/25/2015    MEDICATIONS: Current Outpatient Prescriptions on File Prior to Visit  Medication Sig Dispense Refill  . aspirin 81 MG tablet Take 81 mg by mouth daily.    . cholecalciferol (VITAMIN D) 1000 UNITS tablet Take 1,000 Units by mouth daily.    . clomiPHENE (CLOMID) 50 MG tablet Take 50 mg by mouth daily.    Marland Kitchen lisinopril (PRINIVIL,ZESTRIL) 10 MG tablet Take 1 tablet (10 mg total) by mouth daily. 90 tablet 2  . Multiple Vitamins-Minerals (MULTIVITAMIN WITH MINERALS) tablet Take 1 tablet by mouth daily.    Marland Kitchen OLANZapine (ZYPREXA) 10 MG tablet Take 10 mg by mouth at bedtime as needed (as needed to ward off  cluster headaches.).     Marland Kitchen sildenafil (VIAGRA) 100 MG tablet Take 100 mg by mouth daily as needed for erectile dysfunction.    . valACYclovir (VALTREX) 500 MG tablet Take 500 mg by mouth daily.     No current facility-administered medications on file prior to visit.    ALLERGIES: Allergies  Allergen Reactions  . Sulfa Antibiotics Hives    Hives     FAMILY HISTORY: Family History  Problem Relation Age of Onset  . Breast cancer Mother   . Uterine cancer Mother   . Rheum arthritis Son     SOCIAL HISTORY: History   Social History  . Marital Status: Married    Spouse Name: Brian Hoover  . Number of Children: 2  . Years of Education: MA   Occupational History  . retired Chief Financial Officer    Social History Main Topics  . Smoking status: Former Smoker -- 1.50 packs/day for 3 years    Types: Cigarettes    Quit date: 12/04/1960  . Smokeless tobacco: Never Used  . Alcohol Use: 6.0 oz/week    10 Shots of liquor per week      Comment: 4 Drinks a week  . Drug Use: No  . Sexual Activity:    Partners: Female   Other Topics Concern  . Not on file   Social History Narrative      Pt lives at home with his spouse.   Caffeine Use: 2 cups daily.    REVIEW OF SYSTEMS: Constitutional: No fevers, chills, or sweats, no generalized fatigue, change in appetite Eyes: No visual changes, double vision, eye pain Ear, nose and throat: No hearing loss, ear pain, nasal congestion, sore throat Cardiovascular: No chest pain, palpitations Respiratory:  No shortness of breath at rest or with exertion, wheezes GastrointestinaI: No nausea, vomiting, diarrhea, abdominal pain, fecal incontinence Genitourinary:  No dysuria, urinary retention or frequency Musculoskeletal:  No  neck pain, back pain Integumentary: No rash, pruritus, skin lesions Neurological: as above Psychiatric: No depression, insomnia, anxiety Endocrine: No palpitations, fatigue, diaphoresis, mood swings, change in appetite, change in weight, increased thirst Hematologic/Lymphatic:  No anemia, purpura, petechiae. Allergic/Immunologic: no itchy/runny eyes, nasal congestion, recent allergic reactions, rashes  PHYSICAL EXAM: Filed Vitals:   03/03/15 0933  BP: 120/70  Pulse: 60  Temp: 97.7 F (36.5 C)  Resp: 16   General: No acute distress Head:  Normocephalic/atraumatic Eyes:  Fundoscopic exam unremarkable without vessel changes, exudates, hemorrhages or papilledema. Neck: supple, no paraspinal tenderness, full range of motion Heart:  Regular rate and rhythm Lungs:  Clear to auscultation bilaterally Back: No paraspinal tenderness Neurological Exam: alert and oriented to person, place, and time, recent and remote memory intact, fund of knowledge intact, attention and concentration intact, speech fluent and not dysarthric, language intact.  CN II-XII intact.  Bulk and tone normal.  Muscle strength 5/5 throughout.  Sensation to light touch intact.  Deep tendon  reflexes 1+ throughout except absent in ankles.  Finger to nose intact.  Gait slow but with normal stride.  IMPRESSION: Cluster headaches.  Stable Chronic headaches.  He now reports some mild nausea.  Etiology not clear.  Either tension-type versus migraine. Dizziness.  Unknown etiology Gait instability MGUS  PLAN: 1.  Will restart nortriptyline at 25mg  at bedtime.  Memory problems predate the nortriptyline 2.  Will have him return to evaluate memory 3.  Limit use of Zyprexa  Brian Clines, DO  CC:  Brian Caraway, MD

## 2015-03-03 NOTE — Patient Instructions (Signed)
1.  Will restart nortriptyline at 25mg  at bedtime.  CALL IN 4 WEEKS WITH UPDATE AND WE CAN ADJUST DOSE IF NEEDED. 2.  Try to limit use of Zyprexa if possible. 3.  Make follow up appointment to evaluate memory

## 2015-03-22 ENCOUNTER — Ambulatory Visit (INDEPENDENT_AMBULATORY_CARE_PROVIDER_SITE_OTHER): Payer: Medicare Other | Admitting: *Deleted

## 2015-03-22 DIAGNOSIS — R55 Syncope and collapse: Secondary | ICD-10-CM

## 2015-03-24 NOTE — Progress Notes (Signed)
Loop recorder 

## 2015-04-05 ENCOUNTER — Other Ambulatory Visit: Payer: Self-pay | Admitting: *Deleted

## 2015-04-05 ENCOUNTER — Telehealth: Payer: Self-pay | Admitting: *Deleted

## 2015-04-05 MED ORDER — NORTRIPTYLINE HCL 25 MG PO CAPS: ORAL_CAPSULE | ORAL | Status: DC

## 2015-04-05 NOTE — Telephone Encounter (Signed)
Nortriptyline 50 mg called to pharmacy with 1 po HS # 30 3 refills

## 2015-04-05 NOTE — Telephone Encounter (Signed)
Patient taking nortriptyline but still having morning headaches does to increase his medication Call back number [phone]

## 2015-04-05 NOTE — Telephone Encounter (Signed)
Increase nortriptyline to 50mg  at bedtime and give it another 4 weeks.

## 2015-04-07 ENCOUNTER — Encounter: Payer: Self-pay | Admitting: Internal Medicine

## 2015-04-12 ENCOUNTER — Ambulatory Visit (HOSPITAL_BASED_OUTPATIENT_CLINIC_OR_DEPARTMENT_OTHER): Payer: Medicare Other

## 2015-04-12 DIAGNOSIS — D472 Monoclonal gammopathy: Secondary | ICD-10-CM

## 2015-04-12 LAB — COMPREHENSIVE METABOLIC PANEL (CC13)
ALT: 21 U/L (ref 0–55)
ANION GAP: 10 meq/L (ref 3–11)
AST: 21 U/L (ref 5–34)
Albumin: 3.5 g/dL (ref 3.5–5.0)
Alkaline Phosphatase: 60 U/L (ref 40–150)
BUN: 19.7 mg/dL (ref 7.0–26.0)
CALCIUM: 8.5 mg/dL (ref 8.4–10.4)
CO2: 24 meq/L (ref 22–29)
CREATININE: 0.9 mg/dL (ref 0.7–1.3)
Chloride: 97 mEq/L — ABNORMAL LOW (ref 98–109)
EGFR: 81 mL/min/{1.73_m2} — AB (ref >90)
GLUCOSE: 113 mg/dL (ref 70–140)
Potassium: 4.1 mEq/L (ref 3.5–5.1)
Sodium: 131 mEq/L — ABNORMAL LOW (ref 136–145)
Total Bilirubin: 0.54 mg/dL (ref 0.20–1.20)
Total Protein: 6.1 g/dL — ABNORMAL LOW (ref 6.4–8.3)

## 2015-04-12 LAB — CBC WITH DIFFERENTIAL/PLATELET
BASO%: 0.3 % (ref 0.0–2.0)
Basophils Absolute: 0 10*3/uL (ref 0.0–0.1)
EOS%: 2.4 % (ref 0.0–7.0)
Eosinophils Absolute: 0.1 10*3/uL (ref 0.0–0.5)
HCT: 41.5 % (ref 38.4–49.9)
HEMOGLOBIN: 14.7 g/dL (ref 13.0–17.1)
LYMPH%: 12.2 % — ABNORMAL LOW (ref 14.0–49.0)
MCH: 31.3 pg (ref 27.2–33.4)
MCHC: 35.4 g/dL (ref 32.0–36.0)
MCV: 88.5 fL (ref 79.3–98.0)
MONO#: 0.6 10*3/uL (ref 0.1–0.9)
MONO%: 10.8 % (ref 0.0–14.0)
NEUT#: 4.3 10*3/uL (ref 1.5–6.5)
NEUT%: 74.3 % (ref 39.0–75.0)
Platelets: 202 10*3/uL (ref 140–400)
RBC: 4.69 10*6/uL (ref 4.20–5.82)
RDW: 12.6 % (ref 11.0–14.6)
WBC: 5.8 10*3/uL (ref 4.0–10.3)
lymph#: 0.7 10*3/uL — ABNORMAL LOW (ref 0.9–3.3)

## 2015-04-12 LAB — LACTATE DEHYDROGENASE (CC13): LDH: 169 U/L (ref 125–245)

## 2015-04-14 LAB — BETA 2 MICROGLOBULIN, SERUM: Beta-2 Microglobulin: 1.69 mg/L (ref <=2.51)

## 2015-04-15 LAB — SPEP & IFE WITH QIG
Abnormal Protein Band1: 0.2 g/dL
Albumin ELP: 3.8 g/dL (ref 3.8–4.8)
Alpha-1-Globulin: 0.2 g/dL (ref 0.2–0.3)
Alpha-2-Globulin: 0.5 g/dL (ref 0.5–0.9)
BETA 2: 0.6 g/dL — AB (ref 0.2–0.5)
BETA GLOBULIN: 0.4 g/dL (ref 0.4–0.6)
GAMMA GLOBULIN: 0.7 g/dL — AB (ref 0.8–1.7)
IGA: 152 mg/dL (ref 68–379)
IGM, SERUM: 109 mg/dL (ref 41–251)
IgG (Immunoglobin G), Serum: 1060 mg/dL (ref 650–1600)
Total Protein, Serum Electrophoresis: 6.1 g/dL (ref 6.1–8.1)

## 2015-04-15 LAB — KAPPA/LAMBDA LIGHT CHAINS
KAPPA FREE LGHT CHN: 1.4 mg/dL (ref 0.33–1.94)
Kappa:Lambda Ratio: 1.03 (ref 0.26–1.65)
LAMBDA FREE LGHT CHN: 1.36 mg/dL (ref 0.57–2.63)

## 2015-04-20 LAB — UIFE/LIGHT CHAINS/TP QN, 24-HR UR
ALBUMIN, U: DETECTED
ALPHA 1 UR: DETECTED — AB
Alpha 2, Urine: DETECTED — AB
Beta, Urine: DETECTED — AB
Gamma Globulin, Urine: DETECTED — AB
TOTAL PROTEIN, URINE-UR/DAY: 132 mg/d (ref <150)
Time: 24 hours
Total Protein, Urine: 6 mg/dL (ref 5–25)
Volume, Urine: 2200 mL

## 2015-04-20 LAB — CUP PACEART REMOTE DEVICE CHECK: MDC IDC SESS DTM: 20160430040500

## 2015-04-20 LAB — 24 HR URINE,KAPPA/LAMBDA LIGHT CHAINS
24H Urine Volume: 2200 mL/24 h
Measured Lambda Chain: <0.4 mg/dL (ref <2.00)

## 2015-04-21 ENCOUNTER — Ambulatory Visit (INDEPENDENT_AMBULATORY_CARE_PROVIDER_SITE_OTHER): Payer: Medicare Other | Admitting: *Deleted

## 2015-04-21 DIAGNOSIS — R55 Syncope and collapse: Secondary | ICD-10-CM

## 2015-04-22 ENCOUNTER — Encounter: Payer: Self-pay | Admitting: Internal Medicine

## 2015-04-23 ENCOUNTER — Ambulatory Visit (HOSPITAL_BASED_OUTPATIENT_CLINIC_OR_DEPARTMENT_OTHER): Payer: Medicare Other | Admitting: Hematology and Oncology

## 2015-04-23 ENCOUNTER — Telehealth: Payer: Self-pay | Admitting: Hematology and Oncology

## 2015-04-23 ENCOUNTER — Encounter: Payer: Self-pay | Admitting: Hematology and Oncology

## 2015-04-23 VITALS — BP 127/67 | HR 61 | Temp 98.0°F | Resp 18 | Ht 70.0 in | Wt 167.5 lb

## 2015-04-23 DIAGNOSIS — D472 Monoclonal gammopathy: Secondary | ICD-10-CM

## 2015-04-23 DIAGNOSIS — R55 Syncope and collapse: Secondary | ICD-10-CM

## 2015-04-23 NOTE — Progress Notes (Signed)
Loop recorder 

## 2015-04-23 NOTE — Assessment & Plan Note (Signed)
He has multiple neurological manifestation which could be unrelated to monoclonal paraproteinemia. Repeat serum protein electrophoresis, free light chains were stable. Overall, he does not appear to have permanent end organ damage. I will recommend following up with him on an annual basis with blood work and skeletal survey

## 2015-04-23 NOTE — Telephone Encounter (Signed)
Gave and printed appt sched adn avs for pt for May 2017 °

## 2015-04-23 NOTE — Assessment & Plan Note (Signed)
He had recurrent near syncopal episode and gait instability. I reviewed his last EKG which did not show significant arrhythmia Clinically, he has no signs to suggest congestive heart failure. I recommend further workup with his cardiologist and I would defer to their judgment whether it will be appropriate to order special echocardiogram to exclude potential amyloidosis that sometimes could be associated with MGUS, especially with lambda light chain involvement.

## 2015-04-23 NOTE — Progress Notes (Signed)
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 Blood workup from February 2015 detected 0.3 g of M spike. Repeat serum protein electrophoresis in December 2015 showed monoclonal IgG lambda with an M spike measuring 0.24. These blood work were ordered by his neurologist due to the patient having balance issues. He denies significant weakness or peripheral neuropathy. The patient has significant abnormal heart rhythm and have implantation of defibrillator.  He denies history of abnormal bone pain or bone fracture. Patient denies history of recurrent infection or atypical infections such as shingles of meningitis. Denies chills, night sweats, anorexia or abnormal weight loss. He was observed  INTERVAL HISTORY: Please see below for problem oriented charting. He continues to have balance issue. Denies recent chest pain, shortness of breath or leg edema. Denies recent infection. No new bone pain.  REVIEW OF SYSTEMS:   Constitutional: Denies fevers, chills or abnormal weight loss Eyes: Denies blurriness of vision Ears, nose, mouth, throat, and face: Denies mucositis or sore throat Respiratory: Denies cough, dyspnea or wheezes Cardiovascular: Denies palpitation, chest discomfort or lower extremity swelling Gastrointestinal:  Denies nausea, heartburn or change in bowel habits Skin: Denies abnormal skin rashes Lymphatics: Denies new lymphadenopathy or easy bruising Neurological:Denies numbness, tingling or new weaknesses Behavioral/Psych: Mood is stable, no new changes  All other systems were reviewed with the patient and are negative.  I have reviewed the past medical history, past surgical history,  social history and family history with the patient and they are unchanged from previous note.  ALLERGIES:  is allergic to sulfa antibiotics.  MEDICATIONS:  Current Outpatient Prescriptions  Medication Sig Dispense Refill  . aspirin 81 MG tablet Take 81 mg by mouth daily.    . cholecalciferol (VITAMIN D) 1000 UNITS tablet Take 1,000 Units by mouth daily.    . clomiPHENE (CLOMID) 50 MG tablet Take 50 mg by mouth daily.    Marland Kitchen lisinopril (PRINIVIL,ZESTRIL) 10 MG tablet Take 1 tablet (10 mg total) by mouth daily. 90 tablet 2  . Multiple Vitamins-Minerals (MULTIVITAMIN WITH MINERALS) tablet Take 1 tablet by mouth daily.    . nortriptyline (PAMELOR) 25 MG capsule Take 50 mg  PO at HS 30 capsule 3  . OLANZapine (ZYPREXA) 10 MG tablet Take 10 mg by mouth at bedtime as needed (as needed to ward off  cluster headaches.).     Marland Kitchen OLANZapine (ZYPREXA) 5 MG tablet   5  . sildenafil (VIAGRA) 100 MG tablet Take 100 mg by mouth daily as needed for erectile dysfunction.    . valACYclovir (VALTREX) 500 MG tablet Take 500 mg by mouth daily.     No current facility-administered medications for this visit.    PHYSICAL EXAMINATION: ECOG PERFORMANCE STATUS: 1 - Symptomatic but completely ambulatory  Filed Vitals:   04/23/15 1210  BP: 127/67  Pulse: 61  Temp: 98 F (36.7 C)  Resp: 18   Filed Weights   04/23/15 1210  Weight: 167 lb 8 oz (75.978 kg)    GENERAL:alert, no distress and comfortable SKIN: skin color, texture, turgor are normal, no rashes or significant lesions EYES: normal, Conjunctiva are pink and non-injected, sclera clear OROPHARYNX:no exudate, no erythema and lips, buccal mucosa, and tongue normal  NECK: supple, thyroid normal  size, non-tender, without nodularity LYMPH:  no palpable lymphadenopathy in the cervical, axillary or inguinal LUNGS: clear to auscultation and percussion with normal breathing effort HEART: regular rate & rhythm and no murmurs and no lower extremity  edema ABDOMEN:abdomen soft, non-tender and normal bowel sounds Musculoskeletal:no cyanosis of digits and no clubbing  NEURO: alert & oriented x 3 with fluent speech, no focal motor/sensory deficits  LABORATORY DATA:  I have reviewed the data as listed    Component Value Date/Time   NA 131* 04/12/2015 1159   NA 130* 02/03/2013 0655   K 4.1 04/12/2015 1159   K 4.5 02/03/2013 0655   CL 95* 02/03/2013 0655   CO2 24 04/12/2015 1159   CO2 31 02/03/2013 0655   GLUCOSE 113 04/12/2015 1159   GLUCOSE 102* 02/03/2013 0655   BUN 19.7 04/12/2015 1159   BUN 32* 02/03/2013 0655   CREATININE 0.9 04/12/2015 1159   CREATININE 0.92 02/03/2013 0655   CALCIUM 8.5 04/12/2015 1159   CALCIUM 9.0 02/03/2013 0655   PROT 6.1* 04/12/2015 1159   PROT 6.6 01/13/2014 1438   ALBUMIN 3.5 04/12/2015 1159   AST 21 04/12/2015 1159   ALT 21 04/12/2015 1159   ALKPHOS 60 04/12/2015 1159   BILITOT 0.54 04/12/2015 1159   GFRNONAA 78* 02/03/2013 0655   GFRAA >90 02/03/2013 0655    No results found for: SPEP, UPEP  Lab Results  Component Value Date   WBC 5.8 04/12/2015   NEUTROABS 4.3 04/12/2015   HGB 14.7 04/12/2015   HCT 41.5 04/12/2015   MCV 88.5 04/12/2015   PLT 202 04/12/2015      Chemistry      Component Value Date/Time   NA 131* 04/12/2015 1159   NA 130* 02/03/2013 0655   K 4.1 04/12/2015 1159   K 4.5 02/03/2013 0655   CL 95* 02/03/2013 0655   CO2 24 04/12/2015 1159   CO2 31 02/03/2013 0655   BUN 19.7 04/12/2015 1159   BUN 32* 02/03/2013 0655   CREATININE 0.9 04/12/2015 1159   CREATININE 0.92 02/03/2013 0655      Component Value Date/Time   CALCIUM 8.5 04/12/2015 1159   CALCIUM 9.0 02/03/2013 0655   ALKPHOS 60 04/12/2015 1159   AST 21 04/12/2015 1159   ALT 21 04/12/2015 1159   BILITOT 0.54 04/12/2015 1159     ASSESSMENT & PLAN:  MGUS (monoclonal gammopathy of unknown significance) He has multiple neurological manifestation which could be unrelated to monoclonal  paraproteinemia. Repeat serum protein electrophoresis, free light chains were stable. Overall, he does not appear to have permanent end organ damage. I will recommend following up with him on an annual basis with blood work and skeletal survey    Syncope He had recurrent near syncopal episode and gait instability. I reviewed his last EKG which did not show significant arrhythmia Clinically, he has no signs to suggest congestive heart failure. I recommend further workup with his cardiologist and I would defer to their judgment whether it will be appropriate to order special echocardiogram to exclude potential amyloidosis that sometimes could be associated with MGUS, especially with lambda light chain involvement.    Orders Placed This Encounter  Procedures  . CBC with Differential/Platelet    Standing Status: Future     Number of Occurrences:      Standing Expiration Date: 05/27/2016  . Comprehensive metabolic panel    Standing Status: Future     Number of Occurrences:      Standing Expiration Date: 05/27/2016  .  Lactate dehydrogenase    Standing Status: Future     Number of Occurrences:      Standing Expiration Date: 05/27/2016  . SPEP & IFE with QIG    Standing Status: Future     Number of Occurrences:      Standing Expiration Date: 05/27/2016  . Kappa/lambda light chains    Standing Status: Future     Number of Occurrences:      Standing Expiration Date: 05/27/2016  . Beta 2 microglobulin, serum    Standing Status: Future     Number of Occurrences:      Standing Expiration Date: 05/27/2016   All questions were answered. The patient knows to call the clinic with any problems, questions or concerns. No barriers to learning was detected. I spent 15 minutes counseling the patient face to face. The total time spent in the appointment was 20 minutes and more than 50% was on counseling and review of test results     [facility], [facility], MD 04/23/2015 12:31 PM

## 2015-04-26 ENCOUNTER — Ambulatory Visit (INDEPENDENT_AMBULATORY_CARE_PROVIDER_SITE_OTHER): Payer: Medicare Other | Admitting: Neurology

## 2015-04-26 ENCOUNTER — Encounter: Payer: Self-pay | Admitting: Neurology

## 2015-04-26 VITALS — BP 122/70 | HR 68 | Resp 18 | Ht 70.0 in | Wt 168.4 lb

## 2015-04-26 DIAGNOSIS — R519 Headache, unspecified: Secondary | ICD-10-CM | POA: Insufficient documentation

## 2015-04-26 DIAGNOSIS — R51 Headache: Secondary | ICD-10-CM | POA: Diagnosis not present

## 2015-04-26 DIAGNOSIS — R413 Other amnesia: Secondary | ICD-10-CM

## 2015-04-26 MED ORDER — NORTRIPTYLINE HCL 25 MG PO CAPS: 75.0000 mg | ORAL_CAPSULE | Freq: Every day | ORAL | Status: DC

## 2015-04-26 NOTE — Patient Instructions (Signed)
1.  Increase nortriptyline to 3 capsules (75mg ) every night 2.  Monitor memory.  Will recheck in one year 3.  Follow up in 3 months.

## 2015-04-26 NOTE — Progress Notes (Signed)
NEUROLOGY FOLLOW UP OFFICE NOTE  Brian Hoover [id number]  HISTORY OF PRESENT ILLNESS: Brian Hoover is an 81 year old right-handed man with cluster headaches, COPD, HSV II, CHF, GERD, chronic systolic heart failure, PVCs, and history of pheumothorax whom I see for headache and gait instability, presents for evaluation of memory.  He has noticed some memory difficulties for about 2 years.  Specifically, he notes some problems recalling names of casual acquaintances (people he has only met maybe a couple of times).  Sometimes, when he is driving on familiar routes to places he frequents, he needs to take a moment to re-orient himself.  This occurs with places such as restaurants, but not places he goes to often, such as the grocery store.  He denies problems with word-finding or remembering to take medication.  He manages his finances without difficulty.  He does not repeat questions.  He does not exhibit REM sleep behavior disorder.  He has not had any personality or behavioral changes.  He denies hallucinations.  There is no known family history of dementia or cognitive impairment.  He has a Brewing technologist.  Headaches improved and occur 1 to 2 times a week.  MRI of the brain from 11/05/12 was unremarkable.    PAST MEDICAL HISTORY: Past Medical History  Diagnosis Date  . Chronic systolic heart failure   . PVC (premature ventricular contraction)   . Bradycardia   . COPD (chronic obstructive pulmonary disease)   . Dyspnea   . Pneumothorax 01/28/2013  . CHF (congestive heart failure)   . GERD (gastroesophageal reflux disease)   . Migraine   . Syncope     s/p Medtronic ILR implant 05/2013 by Dr [name]  . MGUS (monoclonal gammopathy of unknown significance) 01/25/2015    MEDICATIONS: Current Outpatient Prescriptions on File Prior to Visit  Medication Sig Dispense Refill  . aspirin 81 MG tablet Take 81 mg by mouth daily.    . cholecalciferol (VITAMIN D) 1000 UNITS tablet Take 1,000 Units by  mouth daily.    . clomiPHENE (CLOMID) 50 MG tablet Take 50 mg by mouth daily.    Marland Kitchen lisinopril (PRINIVIL,ZESTRIL) 10 MG tablet Take 1 tablet (10 mg total) by mouth daily. 90 tablet 2  . Multiple Vitamins-Minerals (MULTIVITAMIN WITH MINERALS) tablet Take 1 tablet by mouth daily.    Marland Kitchen OLANZapine (ZYPREXA) 10 MG tablet Take 10 mg by mouth at bedtime as needed (as needed to ward off  cluster headaches.).     Marland Kitchen OLANZapine (ZYPREXA) 5 MG tablet   5  . sildenafil (VIAGRA) 100 MG tablet Take 100 mg by mouth daily as needed for erectile dysfunction.    . valACYclovir (VALTREX) 500 MG tablet Take 500 mg by mouth daily.     No current facility-administered medications on file prior to visit.    ALLERGIES: Allergies  Allergen Reactions  . Sulfa Antibiotics Hives    Hives     FAMILY HISTORY: Family History  Problem Relation Age of Onset  . Breast cancer Mother   . Uterine cancer Mother   . Rheum arthritis Son     SOCIAL HISTORY: History   Social History  . Marital Status: Married    Spouse Name: [name]  . Number of Children: 2  . Years of Education: MA   Occupational History  . retired Chief Financial Officer    Social History Main Topics  . Smoking status: Former Smoker -- 1.50 packs/day for 3 years    Types: Cigarettes    Quit  date: 12/04/1960  . Smokeless tobacco: Never Used  . Alcohol Use: 6.0 oz/week    10 Shots of liquor per week     Comment: 4 Drinks a week  . Drug Use: No  . Sexual Activity:    Partners: Female   Other Topics Concern  . Not on file   Social History Narrative      Pt lives at home with his spouse.   Caffeine Use: 2 cups daily.    REVIEW OF SYSTEMS: Constitutional: No fevers, chills, or sweats, no generalized fatigue, change in appetite Eyes: No visual changes, double vision, eye pain Ear, nose and throat: No hearing loss, ear pain, nasal congestion, sore throat Cardiovascular: No chest pain, palpitations Respiratory:  No shortness of breath at rest or with  exertion, wheezes GastrointestinaI: No nausea, vomiting, diarrhea, abdominal pain, fecal incontinence Genitourinary:  No dysuria, urinary retention or frequency Musculoskeletal:  No neck pain, back pain Integumentary: No rash, pruritus, skin lesions Neurological: as above Psychiatric: No depression, insomnia, anxiety Endocrine: No palpitations, fatigue, diaphoresis, mood swings, change in appetite, change in weight, increased thirst Hematologic/Lymphatic:  No anemia, purpura, petechiae. Allergic/Immunologic: no itchy/runny eyes, nasal congestion, recent allergic reactions, rashes  PHYSICAL EXAM: Filed Vitals:   04/26/15 0900  BP: 122/70  Pulse: 68  Resp: 18   General: No acute distress Head:  Normocephalic/atraumatic Eyes:  Fundoscopic exam unremarkable without vessel changes, exudates, hemorrhages or papilledema. Neck: supple, no paraspinal tenderness, full range of motion Heart:  Regular rate and rhythm Lungs:  Clear to auscultation bilaterally Back: No paraspinal tenderness Neurological Exam: alert and oriented to person, place, and time. Attention span and concentration intact, delayed recall 3 of 5 words, remote memory intact, fund of knowledge intact.  Speech fluent and not dysarthric, language intact.   Montreal Cognitive Assessment  04/26/2015  Visuospatial/ Executive (0/5) 3  Naming (0/3) 3  Attention: Read list of digits (0/2) 1  Attention: Read list of letters (0/1) 1  Attention: Serial 7 subtraction starting at 100 (0/3) 3  Language: Repeat phrase (0/2) 2  Language : Fluency (0/1) 0  Abstraction (0/2) 2  Delayed Recall (0/5) 3  Orientation (0/6) 6  Total 24  Adjusted Score (based on education) 24   CN II-XII intact. Fundoscopic exam unremarkable without vessel changes, exudates, hemorrhages or papilledema.  Bulk and tone normal, muscle strength 5/5 throughout.  Sensation to light touch, temperature and vibration intact.  Deep tendon reflexes 2+ throughout, toes  downgoing.  Finger to nose and heel to shin testing intact.  Gait normal, Romberg negative.  IMPRESSION: Memory difficulties.  A mild cognitive impairment of the amnestic type is possible. Headache, improved Gait instability  PLAN: 1.  I proposed neuropsych testing.  Since it is mild, he would like to hold off on such testing and retest [place] in 9-12 months. 2.  Increase nortriptyline to 44m at bedtime 3.  Follow up in 3 months.  15 minutes spent face to face with patient, over 50% spent discussing memory test results and management.  AMetta Clines DO

## 2015-04-27 ENCOUNTER — Ambulatory Visit (INDEPENDENT_AMBULATORY_CARE_PROVIDER_SITE_OTHER): Payer: Medicare Other | Admitting: Internal Medicine

## 2015-04-27 ENCOUNTER — Encounter: Payer: Self-pay | Admitting: Internal Medicine

## 2015-04-27 VITALS — BP 155/78 | HR 66 | Ht 70.0 in | Wt 167.4 lb

## 2015-04-27 DIAGNOSIS — R55 Syncope and collapse: Secondary | ICD-10-CM | POA: Diagnosis not present

## 2015-04-27 DIAGNOSIS — I10 Essential (primary) hypertension: Secondary | ICD-10-CM

## 2015-04-27 DIAGNOSIS — I493 Ventricular premature depolarization: Secondary | ICD-10-CM | POA: Diagnosis not present

## 2015-04-27 LAB — CUP PACEART INCLINIC DEVICE CHECK
MDC IDC SESS DTM: 20160524091922
MDC IDC SET ZONE DETECTION INTERVAL: 2000 ms
MDC IDC SET ZONE DETECTION INTERVAL: 3000 ms
MDC IDC SET ZONE DETECTION INTERVAL: 400 ms

## 2015-04-27 NOTE — Assessment & Plan Note (Signed)
He still has occaisional PVC"s which do not appear to be symptomatic. Will follow.

## 2015-04-27 NOTE — Assessment & Plan Note (Signed)
His blood pressure is elevated today but has been well controlled. He will continue his current meds. I have encouraged him to maintain a low sodium diet.

## 2015-04-27 NOTE — Progress Notes (Signed)
HPI  Mr. Kissoon returns today for followup.he is a very pleasant 81 year old man with a history of symptomatic PVCs, chronic systolic heart failure, and improvement in left ventricular dysfunction, who is status post insertion of an implantable loop recorder, after experiencing a syncopal episode. The patient has had no recurrent syncope. He does note chronic dizziness and loss and balance. He has sold his 27 foot sailboat. Allergies  Allergen Reactions  . Sulfa Antibiotics Hives    Hives      Current Outpatient Prescriptions  Medication Sig Dispense Refill  . aspirin 81 MG tablet Take 81 mg by mouth daily.    . cholecalciferol (VITAMIN D) 1000 UNITS tablet Take 1,000 Units by mouth daily.    Marland Kitchen lisinopril (PRINIVIL,ZESTRIL) 10 MG tablet Take 1 tablet (10 mg total) by mouth daily. 90 tablet 2  . Multiple Vitamins-Minerals (MULTIVITAMIN WITH MINERALS) tablet Take 1 tablet by mouth daily.    . nortriptyline (PAMELOR) 25 MG capsule Take 3 capsules (75 mg total) by mouth at bedtime. 90 capsule 3  . OLANZapine (ZYPREXA) 10 MG tablet Take 10 mg by mouth at bedtime as needed (as needed to ward off  cluster headaches.).     Marland Kitchen sildenafil (VIAGRA) 100 MG tablet Take 100 mg by mouth daily as needed for erectile dysfunction.    . valACYclovir (VALTREX) 500 MG tablet Take 500 mg by mouth daily.     No current facility-administered medications for this visit.     Past Medical History  Diagnosis Date  . Chronic systolic heart failure   . PVC (premature ventricular contraction)   . Bradycardia   . COPD (chronic obstructive pulmonary disease)   . Dyspnea   . Pneumothorax 01/28/2013  . CHF (congestive heart failure)   . GERD (gastroesophageal reflux disease)   . Migraine   . Syncope     s/p Medtronic ILR implant 05/2013 by Dr [name]  . MGUS (monoclonal gammopathy of unknown significance) 01/25/2015    ROS:   All systems reviewed and negative except as noted in the HPI.   Past  Surgical History  Procedure Laterality Date  . Tendon repair      right foot  . Shoulder arthroscopy    . Prostate surgery    . Clavicle surgery    . Chest tube insertion Right 01/29/2013  . Loop recorder implant  06/02/2013    Medtronic LinQ implanted for syncope by Dr [name]  . Loop recorder implant N/A 06/02/2013    Procedure: LOOP RECORDER IMPLANT;  Surgeon: [name], MD;  Location: [facility] CATH LAB;  Service: Cardiovascular;  Laterality: N/A;     Family History  Problem Relation Age of Onset  . Breast cancer Mother   . Uterine cancer Mother   . Rheum arthritis Son      History   Social History  . Marital Status: Married    Spouse Name: [name]  . Number of Children: 2  . Years of Education: MA   Occupational History  . retired Chief Financial Officer    Social History Main Topics  . Smoking status: Former Smoker -- 1.50 packs/day for 3 years    Types: Cigarettes    Quit date: 12/04/1960  . Smokeless tobacco: Never Used  . Alcohol Use: 6.0 oz/week    10 Shots of liquor per week     Comment: 4 Drinks a week  . Drug Use: No  . Sexual Activity:    Partners: Female   Other Topics  Concern  . Not on file   Social History Narrative      Pt lives at home with his spouse.   Caffeine Use: 2 cups daily.     BP 155/78 mmHg  Pulse 66  Ht 5\' 10"  (1.778 m)  Wt 167 lb 6.4 oz (75.932 kg)  BMI 24.02 kg/m2  Physical Exam:  Well appearing elderly man, NAD HEENT: Unremarkable Neck:  No JVD, no thyromegall Back:  No CVA tenderness Lungs:  Clear with no wheezes, rales, or rhonchi. HEART:  Regular rate rhythm, no murmurs, no rubs, no clicks Abd:  soft, positive bowel sounds, no organomegally, no rebound, no guarding Ext:  2 plus pulses, no edema, no cyanosis, no clubbing Skin:  No rashes no nodules Neuro:  CN II through XII intact, motor grossly intact  Implantable loop recorder interrogation - no sustained arrhythmias or bradycardic episodes. He had a couple episodes registered  as atrial fib which look like frequent PVC's and PAC's.    Assess/Plan:

## 2015-04-27 NOTE — Patient Instructions (Signed)
Medication Instructions:  Your physician recommends that you continue on your current medications as directed. Please refer to the Current Medication list given to you today.   Labwork: NONE  Testing/Procedures: NONE  Follow-Up: Your physician wants you to follow-up in: 12 months with Dr. Taylor. You will receive a reminder letter in the mail two months in advance. If you don't receive a letter, please call our office to schedule the follow-up appointment.   Any Other Special Instructions Will Be Listed Below (If Applicable).   

## 2015-04-27 NOTE — Assessment & Plan Note (Signed)
He has had no recurrent episodes. He will continue his current meds.

## 2015-04-29 ENCOUNTER — Encounter: Payer: Self-pay | Admitting: Internal Medicine

## 2015-05-06 LAB — CUP PACEART REMOTE DEVICE CHECK: Date Time Interrogation Session: 20160505040500

## 2015-05-21 ENCOUNTER — Ambulatory Visit (INDEPENDENT_AMBULATORY_CARE_PROVIDER_SITE_OTHER): Payer: Medicare Other | Admitting: *Deleted

## 2015-05-21 DIAGNOSIS — R55 Syncope and collapse: Secondary | ICD-10-CM | POA: Diagnosis not present

## 2015-05-24 ENCOUNTER — Encounter: Payer: Self-pay | Admitting: Internal Medicine

## 2015-05-24 NOTE — Progress Notes (Signed)
Loop recorder 

## 2015-05-28 ENCOUNTER — Encounter: Payer: Self-pay | Admitting: Internal Medicine

## 2015-05-31 LAB — CUP PACEART REMOTE DEVICE CHECK: Date Time Interrogation Session: 20160616040500

## 2015-06-10 ENCOUNTER — Encounter: Payer: Self-pay | Admitting: Cardiology

## 2015-06-15 ENCOUNTER — Encounter: Payer: Self-pay | Admitting: Internal Medicine

## 2015-06-21 ENCOUNTER — Ambulatory Visit (INDEPENDENT_AMBULATORY_CARE_PROVIDER_SITE_OTHER): Payer: Medicare Other | Admitting: *Deleted

## 2015-06-21 DIAGNOSIS — R55 Syncope and collapse: Secondary | ICD-10-CM

## 2015-06-22 NOTE — Progress Notes (Signed)
Loop recorder 

## 2015-06-24 LAB — CUP PACEART REMOTE DEVICE CHECK: MDC IDC SESS DTM: 20160620040500

## 2015-07-15 ENCOUNTER — Encounter: Payer: Self-pay | Admitting: Internal Medicine

## 2015-07-20 ENCOUNTER — Ambulatory Visit (INDEPENDENT_AMBULATORY_CARE_PROVIDER_SITE_OTHER): Payer: Medicare Other | Admitting: *Deleted

## 2015-07-20 DIAGNOSIS — R55 Syncope and collapse: Secondary | ICD-10-CM | POA: Diagnosis not present

## 2015-07-21 NOTE — Progress Notes (Signed)
Loop recorder 

## 2015-07-29 ENCOUNTER — Ambulatory Visit (INDEPENDENT_AMBULATORY_CARE_PROVIDER_SITE_OTHER): Payer: Medicare Other | Admitting: Internal Medicine

## 2015-07-29 ENCOUNTER — Ambulatory Visit (INDEPENDENT_AMBULATORY_CARE_PROVIDER_SITE_OTHER)
Admission: RE | Admit: 2015-07-29 | Discharge: 2015-07-29 | Disposition: A | Discharge status: Home / Self Care | Payer: Medicare Other | Source: Ambulatory Visit | Attending: Internal Medicine | Admitting: Internal Medicine

## 2015-07-29 ENCOUNTER — Encounter: Payer: Self-pay | Admitting: Internal Medicine

## 2015-07-29 VITALS — BP 140/78 | HR 66 | Ht 70.0 in | Wt 166.0 lb

## 2015-07-29 DIAGNOSIS — R05 Cough: Secondary | ICD-10-CM

## 2015-07-29 DIAGNOSIS — J432 Centrilobular emphysema: Secondary | ICD-10-CM | POA: Diagnosis not present

## 2015-07-29 DIAGNOSIS — R059 Cough, unspecified: Secondary | ICD-10-CM

## 2015-07-29 MED ORDER — UMECLIDINIUM-VILANTEROL 62.5-25 MCG/INH IN AEPB: INHALATION_SPRAY | RESPIRATORY_TRACT | Status: DC

## 2015-07-29 MED ORDER — LOSARTAN POTASSIUM 25 MG PO TABS: ORAL_TABLET | ORAL | Status: DC

## 2015-07-29 NOTE — Patient Instructions (Addendum)
Sample Anoro Ellipta inhaler     Inhale 1 puff, once daily   See if this helps the cough  Script printed for losartan ( an "ARB")  To try instead of lininopril (an "ACEI")     Over the next month, see if this takes care of the cough  Order - CXR    Dx cough
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
## 2015-08-01 DIAGNOSIS — R05 Cough: Secondary | ICD-10-CM | POA: Insufficient documentation

## 2015-08-01 DIAGNOSIS — R059 Cough, unspecified: Secondary | ICD-10-CM | POA: Insufficient documentation

## 2015-08-01 NOTE — Assessment & Plan Note (Signed)
Nonspecific pattern. Notices mostly when singing. Consider possibility it is a complication of his ACE inhibitor lisinopril-we will have him try an ARB/losartan for a month. He will need to discuss any long-term change with his cardiologist. Usually a month as long enough to assess effect of removing lisinopril. If cough is related to his known mild obstructive airways disease than trial sample Anoro may be helpful. Cough might also result from GERD and reflux precautions were emphasized. Plan-sample Anoro for trial, replace lisinopril for 1 month with losartan, CXR

## 2015-08-01 NOTE — Assessment & Plan Note (Signed)
Mild obstructive airways disease Plan-try sample Anoro Ellipta

## 2015-08-02 ENCOUNTER — Encounter: Payer: Self-pay | Admitting: *Deleted

## 2015-08-02 ENCOUNTER — Telehealth: Payer: Self-pay | Admitting: Internal Medicine

## 2015-08-02 LAB — CUP PACEART REMOTE DEVICE CHECK: MDC IDC SESS DTM: 20160829091729

## 2015-08-02 NOTE — Telephone Encounter (Signed)
Attempted to contact patient, no answer. Will call back.

## 2015-08-03 MED ORDER — UMECLIDINIUM-VILANTEROL 62.5-25 MCG/INH IN AEPB: INHALATION_SPRAY | RESPIRATORY_TRACT | Status: DC

## 2015-08-03 NOTE — Telephone Encounter (Signed)
Ok Anoro Ellipta    Inhale 1 puff, once daily, refill x 11

## 2015-08-03 NOTE — Telephone Encounter (Signed)
Pt returned call. Anoro rx sent to preferred pharmacy. Pt verbalized understanding and denied any further questions or concerns at this time.

## 2015-08-03 NOTE — Telephone Encounter (Signed)
Per 07/29/15 OV: Patient Instructions     Sample Anoro Ellipta inhaler Inhale 1 puff, once daily See if this helps the cough  ---  Called spoke with pt. He reports the anoro is working well and has helped his cough. He wants RX sent in. Please advise Dr. Annamaria Boots thanks

## 2015-08-03 NOTE — Telephone Encounter (Signed)
Called and left message with family member to have pt return call.

## 2015-08-10 ENCOUNTER — Encounter: Payer: Self-pay | Admitting: Neurology

## 2015-08-10 ENCOUNTER — Ambulatory Visit (INDEPENDENT_AMBULATORY_CARE_PROVIDER_SITE_OTHER): Payer: Medicare Other | Admitting: Neurology

## 2015-08-10 VITALS — BP 122/80 | HR 73 | Resp 16 | Ht 70.0 in | Wt 165.0 lb

## 2015-08-10 DIAGNOSIS — R2681 Unsteadiness on feet: Secondary | ICD-10-CM

## 2015-08-10 DIAGNOSIS — R51 Headache: Secondary | ICD-10-CM

## 2015-08-10 DIAGNOSIS — R519 Headache, unspecified: Secondary | ICD-10-CM

## 2015-08-10 NOTE — Patient Instructions (Signed)
1.  Decrease nortriptyline to 50mg  at bedtime for 7 days, then 25mg  daily for 7 days, and then stop 2.  Follow up in 6 months.

## 2015-08-10 NOTE — Progress Notes (Signed)
NEUROLOGY FOLLOW UP OFFICE NOTE  Brian Hoover [id number]  HISTORY OF PRESENT ILLNESS: Brian Hoover is an 81 year old right-handed man with cluster headaches, COPD, HSV II, CHF, GERD, chronic systolic heart failure, PVCs, and history of pheumothorax follows up for cluster headache, chronic tension-type headache and memory problems.  HEADACHE: Update: About 2 severe headaches since last time. Current abortive therapy:  ASA, olanzapine Current preventative therapy:  nortriptyline 67m  Due to morning sleepiness, he would like to stop nortriptyline and see how he does.  History: He has history of cluster headaches for over 20 years as well as other headaches for many years.  [facility]  His cluster headaches are described as involving the right eye, 10/10 intensity, sharp stabbing pain, lasting one hour.  It would occur between 1:30 and 2 am in the morning.  He was previously treated with verapamil, which caused constipation.  He had used O2 and nasal sprays.  For many years, he has been taking Zyprexa.  He takes it as needed, only when he feels he has a cluster headache.  His last cluster headache was in 2012.  He also has other type of headache.  It is bi-frontal, non-throbbing ache, about 6-7/10.  It is not associated with other symptoms such as nausea or photophobia.  It can last all day and typically occurs 4 times a month.  Often, he wakes up with it.  Sometimes, it would lead into a cluster, so he has taken Zyprexa recently for it, but it has not helped.  He was advised to start taking the Zyprexa daily.  He takes ASA or ibuprofen sparingly.  They only take the edge off.  Drinking two cups of coffee treats it well.  He drinks coffee daily.    DIZZINESS/PRESYNCOPE: He has had episodes of feeling "woozy" for 2-3 years.  One time it occurred after a 3 mile walk and another time it occurred when he stood up while walking in his garden.  He  has not lost consciousness.  There is no spinning sensation but he says that the "room swims".  MRI of the brain and MRA of head from 11/05/12 were unremarkable.  MRA of neck showed no stenosis except for some smooth plaque at the origin of the right ICA.  Orthostatic testing was reportedly negative.  He also reports that he sometimes feels very cold in warm weather.  GAIT INSTABILITY Update: Repeat testing of SPEP/IFE from December again showed an M-Spike with abnormal IgA lambda, consistent with MGUS.  He was referred to Brian. GAlvy Bimlerof Heme/Onc.  It was recommended to monitor for now.  A NCV-EMG performed on 01/14/15 showed chronic right L3-L5 radiculopathy but no evidence of generalized sensorimotor polyneuropathy.  To further evaluate gait instability, an MRI of the cervical spine was performed on 01/27/15, which did not reveal spinal stenosis.  History: He was also evaluated for balance issues.  This has been ongoing for 2-3 years.  He denies numbness in the feet or weakness in the legs.  He tore his right achilles heel at that time and had right foot weakness, which improved.  NCV-EMG from 03/17/13 showed evidence of active and chronic right L5 radiculopathy but no evidence of polyneuropathy.  MRI of lumbar spine from 03/20/13 showed multi-level disc bulging and facet hypertrophy but no spinal stenosis or significant foraminal narrowing or cause for L5 radiculopathy.  Neuropathy labs were performed on 01/13/14, revealing small (0.3) M-spike. ANA,  Sed Rate, RF, TSH, Hgb A1c, vitamin D 25-hydroxy, and B12 were unremarkable.   MEMORY PROBLEMS: He has noticed some memory difficulties for about 2 years.  Specifically, he notes some problems recalling names of casual acquaintances (people he has only met maybe a couple of times).  Sometimes, when he is driving on familiar routes to places he frequents, he needs to take a moment to re-orient himself.  This occurs with places such as restaurants, but not places  he goes to often, such as the grocery store.  He denies problems with word-finding or remembering to take medication.  He manages his finances without difficulty.  He does not repeat questions.  He does not exhibit REM sleep behavior disorder.  He has not had any personality or behavioral changes.  He denies hallucinations.  There is no known family history of dementia or cognitive impairment.  He has a Brewing technologist.  MOCA from 04/26/15 was 24/30.  MRI of the brain from 11/05/12 was unremarkable.   PAST MEDICAL HISTORY: Past Medical History  Diagnosis Date  . Chronic systolic heart failure   . PVC (premature ventricular contraction)   . Bradycardia   . COPD (chronic obstructive pulmonary disease)   . Dyspnea   . Pneumothorax 01/28/2013  . CHF (congestive heart failure)   . GERD (gastroesophageal reflux disease)   . Migraine   . Syncope     s/p Medtronic ILR implant 05/2013 by Brian Hoover  . MGUS (monoclonal gammopathy of unknown significance) 01/25/2015    MEDICATIONS: Current Outpatient Prescriptions on File Prior to Visit  Medication Sig Dispense Refill  . aspirin 81 MG tablet Take 81 mg by mouth daily.    . cholecalciferol (VITAMIN D) 1000 UNITS tablet Take 1,000 Units by mouth daily.    Marland Kitchen lisinopril (PRINIVIL,ZESTRIL) 10 MG tablet Take 1 tablet (10 mg total) by mouth daily. 90 tablet 2  . losartan (COZAAR) 25 MG tablet 1/2 tab daily 30 tablet 1  . Multiple Vitamins-Minerals (MULTIVITAMIN WITH MINERALS) tablet Take 1 tablet by mouth daily.    . nortriptyline (PAMELOR) 25 MG capsule Take 3 capsules (75 mg total) by mouth at bedtime. 90 capsule 3  . OLANZapine (ZYPREXA) 10 MG tablet Take 10 mg by mouth at bedtime as needed (as needed to ward off  cluster headaches.).     Marland Kitchen sildenafil (VIAGRA) 100 MG tablet Take 100 mg by mouth daily as needed for erectile dysfunction.    Marland Kitchen Umeclidinium-Vilanterol (ANORO ELLIPTA) 62.5-25 MCG/INH AEPB Inhale 1 puff daily 60 each prn  . valACYclovir (VALTREX) 500  MG tablet Take 500 mg by mouth daily.     No current facility-administered medications on file prior to visit.    ALLERGIES: Allergies  Allergen Reactions  . Sulfa Antibiotics Hives    Hives     FAMILY HISTORY: Family History  Problem Relation Age of Onset  . Breast cancer Mother   . Uterine cancer Mother   . Rheum arthritis Son     SOCIAL HISTORY: Social History   Social History  . Marital Status: Married    Spouse Name: [name]  . Number of Children: 2  . Years of Education: MA   Occupational History  . retired Chief Financial Officer    Social History Main Topics  . Smoking status: Former Smoker -- 1.50 packs/day for 10 years    Types: Cigarettes    Quit date: 12/04/1960  . Smokeless tobacco: Never Used  . Alcohol Use: 6.0 oz/week    10 Shots of  liquor per week     Comment: 4 Drinks a week  . Drug Use: No  . Sexual Activity:    Partners: Female   Other Topics Concern  . Not on file   Social History Narrative      Pt lives at home with his spouse.   Caffeine Use: 2 cups daily.    REVIEW OF SYSTEMS: Constitutional: No fevers, chills, or sweats, no generalized fatigue, change in appetite Eyes: No visual changes, double vision, eye pain Ear, nose and throat: No hearing loss, ear pain, nasal congestion, sore throat Cardiovascular: No chest pain, palpitations Respiratory:  No shortness of breath at rest or with exertion, wheezes GastrointestinaI: No nausea, vomiting, diarrhea, abdominal pain, fecal incontinence Genitourinary:  No dysuria, urinary retention or frequency Musculoskeletal:  No neck pain, back pain Integumentary: No rash, pruritus, skin lesions Neurological: as above Psychiatric: No depression, insomnia, anxiety Endocrine: No palpitations, fatigue, diaphoresis, mood swings, change in appetite, change in weight, increased thirst Hematologic/Lymphatic:  No anemia, purpura, petechiae. Allergic/Immunologic: no itchy/runny eyes, nasal congestion, recent allergic  reactions, rashes  PHYSICAL EXAM: Filed Vitals:   08/10/15 0752  BP: 122/80  Pulse: 73  Resp: 16   General: No acute distress.  Patient appears well-groomed.   Head:  Normocephalic/atraumatic Eyes:  Fundoscopic exam unremarkable without vessel changes, exudates, hemorrhages or papilledema. Neck: supple, no paraspinal tenderness, full range of motion Heart:  Regular rate and rhythm Lungs:  Clear to auscultation bilaterally Back: No paraspinal tenderness Neurological Exam: alert and oriented to person, place, and time. Attention span and concentration intact, recent and remote memory intact, fund of knowledge intact.  Speech fluent and not dysarthric, language intact.  CN II-XII intact. Fundoscopic exam unremarkable without vessel changes, exudates, hemorrhages or papilledema.  Bulk and tone normal, muscle strength 5/5 throughout.  Vibration sensation mildly decreased.  Sensation to pinprick sensation intact.  Deep tendon reflexes 2+ throughout.  Finger to nose and heel to shin testing intact.  Gait normal  IMPRESSION: Headache improved Gait instability Memory deficits, stable  PLAN: Will taper off nortriptyline by 74m daily per week Follow up in 6 months.  15 minutes spent face to face with patient, over 50% spent discussing management.  AMetta Clines DO

## 2015-08-11 ENCOUNTER — Encounter: Payer: Self-pay | Admitting: Internal Medicine

## 2015-08-19 ENCOUNTER — Ambulatory Visit (INDEPENDENT_AMBULATORY_CARE_PROVIDER_SITE_OTHER): Payer: Medicare Other | Admitting: *Deleted

## 2015-08-19 DIAGNOSIS — R55 Syncope and collapse: Secondary | ICD-10-CM | POA: Diagnosis not present

## 2015-08-20 NOTE — Progress Notes (Signed)
Loop recorder 

## 2015-08-21 LAB — CUP PACEART REMOTE DEVICE CHECK: Date Time Interrogation Session: 20160917152648

## 2015-08-21 NOTE — Progress Notes (Signed)
Carelink summary report received. Battery status OK. Normal device function. No new symptom episodes, tachy episodes, brady, or pause episodes. No new AF episodes. Monthly summary reports and ROV with GT in 04/2016.

## 2015-09-08 ENCOUNTER — Encounter: Payer: Self-pay | Admitting: Internal Medicine

## 2015-09-09 ENCOUNTER — Encounter: Payer: Self-pay | Admitting: Cardiology

## 2015-09-09 ENCOUNTER — Telehealth: Payer: Self-pay | Admitting: *Deleted

## 2015-09-09 NOTE — Telephone Encounter (Signed)
Called patient regarding AF on LINQ transmission.  Reviewed strip with Dr. [name], who recommended that patient come into office to discuss anticoagulation.  Patient aware and agreeable to appointment, scheduled for 09/15/15 at 1:45pm.  Patient aware to call with any questions or concerns and is appreciative of call.

## 2015-09-14 ENCOUNTER — Telehealth: Payer: Self-pay | Admitting: Cardiology

## 2015-09-14 NOTE — Telephone Encounter (Signed)
Pt calling with questions about loop recorder. Please call back at home number.

## 2015-09-14 NOTE — Telephone Encounter (Signed)
Returned patient's call.  Answered his questions about how events are stored on device and how transmissions are sent/received.  Confirmed appointment with Dr. [name] tomorrow, 09/15/15 at 1:45pm.  Patient denies any additional questions or concerns at this time.

## 2015-09-15 ENCOUNTER — Ambulatory Visit (INDEPENDENT_AMBULATORY_CARE_PROVIDER_SITE_OTHER): Payer: Medicare Other | Admitting: Internal Medicine

## 2015-09-15 ENCOUNTER — Encounter: Payer: Self-pay | Admitting: Internal Medicine

## 2015-09-15 VITALS — BP 130/72 | HR 64 | Ht 70.0 in | Wt 167.0 lb

## 2015-09-15 DIAGNOSIS — I480 Paroxysmal atrial fibrillation: Secondary | ICD-10-CM

## 2015-09-15 DIAGNOSIS — R55 Syncope and collapse: Secondary | ICD-10-CM

## 2015-09-15 DIAGNOSIS — I493 Ventricular premature depolarization: Secondary | ICD-10-CM

## 2015-09-15 DIAGNOSIS — I4891 Unspecified atrial fibrillation: Secondary | ICD-10-CM | POA: Insufficient documentation

## 2015-09-15 DIAGNOSIS — I495 Sick sinus syndrome: Secondary | ICD-10-CM

## 2015-09-15 LAB — CUP PACEART INCLINIC DEVICE CHECK: Date Time Interrogation Session: 20161012181647

## 2015-09-15 NOTE — Assessment & Plan Note (Signed)
He has had no recurrent episodes despite some bradycardia.

## 2015-09-15 NOTE — Assessment & Plan Note (Signed)
He continues to have some PVC's. He will continue his current meds. No indication for anti-coagulation at this time.

## 2015-09-15 NOTE — Assessment & Plan Note (Signed)
He has had a single episode lasting 10 minutes. He is at risk for more atrial fib. We have discussed the treatment options in detail. The risks/benefits of anti-coagulation or ASA were discussed. He prefers to stay on ASA and wait and see if he has more atrial fib before taking systemic anti-coagulation.
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
## 2015-09-15 NOTE — Assessment & Plan Note (Signed)
He is asymptomatic at this point. He will continue his current meds and undergo watchful waiting.

## 2015-09-15 NOTE — Patient Instructions (Signed)
Medication Instructions:  Your physician recommends that you continue on your current medications as directed. Please refer to the Current Medication list given to you today.   Labwork: None ordered  Testing/Procedures: None ordered  Follow-Up: Your physician wants you to follow-up in: 6 months with Dr. Taylor.   You will receive a reminder letter in the mail two months in advance. If you don't receive a letter, please call our office to schedule the follow-up appointment.   Any Other Special Instructions Will Be Listed Below (If Applicable).   

## 2015-09-17 ENCOUNTER — Ambulatory Visit (INDEPENDENT_AMBULATORY_CARE_PROVIDER_SITE_OTHER): Payer: Medicare Other | Admitting: *Deleted

## 2015-09-17 DIAGNOSIS — R55 Syncope and collapse: Secondary | ICD-10-CM | POA: Diagnosis not present

## 2015-09-22 ENCOUNTER — Encounter: Payer: Self-pay | Admitting: Internal Medicine

## 2015-09-22 NOTE — Progress Notes (Signed)
LOOP RECORDER  

## 2015-10-04 ENCOUNTER — Encounter: Payer: Medicare Other | Admitting: Internal Medicine

## 2015-10-05 LAB — CUP PACEART REMOTE DEVICE CHECK: MDC IDC SESS DTM: 20161016030548

## 2015-10-05 NOTE — Progress Notes (Signed)
Carelink summary report received. Battery status OK. Normal device function. No new symptom, tachy, or pause episodes. 5 brady episodes-- bigeminal (undersensed) PVCs. 1 AF episode (burden 0.3%), +ASA 81mg , patient declines additional A/C until burden increases. Monthly summary reports and ROV with GT in 03/2016.

## 2015-10-11 ENCOUNTER — Ambulatory Visit (INDEPENDENT_AMBULATORY_CARE_PROVIDER_SITE_OTHER): Payer: Medicare Other | Admitting: Cardiology

## 2015-10-11 ENCOUNTER — Telehealth: Payer: Self-pay | Admitting: *Deleted

## 2015-10-11 ENCOUNTER — Encounter: Payer: Self-pay | Admitting: Cardiology

## 2015-10-11 VITALS — BP 122/74 | HR 84 | Ht 70.0 in | Wt 163.1 lb

## 2015-10-11 DIAGNOSIS — I480 Paroxysmal atrial fibrillation: Secondary | ICD-10-CM | POA: Diagnosis not present

## 2015-10-11 DIAGNOSIS — I493 Ventricular premature depolarization: Secondary | ICD-10-CM | POA: Diagnosis not present

## 2015-10-11 DIAGNOSIS — Z7901 Long term (current) use of anticoagulants: Secondary | ICD-10-CM | POA: Diagnosis not present

## 2015-10-11 DIAGNOSIS — R55 Syncope and collapse: Secondary | ICD-10-CM

## 2015-10-11 MED ORDER — RIVAROXABAN 20 MG PO TABS: 20.0000 mg | ORAL_TABLET | Freq: Every day | ORAL | Status: DC

## 2015-10-11 MED ORDER — LISINOPRIL 5 MG PO TABS: 5.0000 mg | ORAL_TABLET | Freq: Every day | ORAL | Status: DC

## 2015-10-11 NOTE — Telephone Encounter (Signed)
Called patient's home number and spoke with wife.  Requested that he send a manual transmission for further review of episodes.  Patient's wife reports that he has been feeling poorly lately and that he has had a virus "for a few weeks", but that his PCP ruled out pneumonia.  Patient's wife verbalized understanding of instructions and states that the patient just got home and that they will attempt to send a manual transmission in a few minutes.  I advised that I will review any episodes with Dr. [name] and call them back.  Patient's wife voices appreciation and denies questions at this time.

## 2015-10-11 NOTE — Patient Instructions (Signed)
Medication Instructions:  Please stop your ASA and Losartan. Start Lisinopril 5 mg a day and Xarelto 20 mg a day. Continue all other medications as listed.  Follow-Up: Follow up in 2 months with Dr [name].  If you need a refill on your cardiac medications before your next appointment, please call your pharmacy.  Thank you for choosing [facility]!!

## 2015-10-11 NOTE — Progress Notes (Signed)
[facility]. [address], [address] [place], [place]  [address] Phone: [phone] Fax:  [phone]  Date:  10/11/2015   ID:  Brian Hoover, DOB Jul 28, 1932, MRN [id number]  PCP:  [name], MD   History of Present Illness: Brian Hoover is a 81 y.o. male here for follow up. History of frequent falls. Prior cardiac history includes frequent PVCs which were successfully ablated by Dr. [name] with resolution of symptoms. During those PVCs, he ended up having cardiomyopathy with decreased ejection fraction. At last check, his EF was 45% in 2013.    Previously, was walking 3 miles into walk. Was dizzy, felt completely off-balance, did not lose consciousness. Collapsed, fell tore forehead. He tried to get up but ended up leaning on trash cans. Very concerning to him. He did not feel palpitations previously. He notes that he woke up a few months ago and he noticed that the room was spinning. He has had this off and on for several months. He notes that if he bends down and then gets up quickly, he will have some dizziness.  Event monitor placed a November 2013 did demonstrate short burst of nonsustained ventricular tachycardia. Because of this, he ended up seeing Dr. Crissie Sickles once again. Since he did not have any recurrence of episodes at that time, Dr. [name] endorsed implantable loop recorder. On interrogation showed no sustained arrhythmias or bradycardic episodes.  He has also seen neurology regarding this matter. Unfortunately, he sustained  fall previously at [facility] fractured ribs, pneumothorax. He slipped on wet shower floor. Clearly no syncope. Since then, right foot drop issues. MRI/MRA back, EMG OK. Perhaps mild difficulty from sacral spine to hip.   Despite this, he has been enjoying himself, went on a sailing cruise.    Wt Readings from Last 3 Encounters:  10/11/15 163 lb 1.9 oz (73.991 kg)  09/15/15 167 lb (75.751 kg)  08/10/15 165 lb (74.844 kg)     Past Medical History    Diagnosis Date  . Chronic systolic heart failure ([place])   . PVC (premature ventricular contraction)   . Bradycardia   . COPD (chronic obstructive pulmonary disease) ([place])   . Dyspnea   . Pneumothorax 01/28/2013  . CHF (congestive heart failure) ([place])   . GERD (gastroesophageal reflux disease)   . Migraine   . Syncope     s/p Medtronic ILR implant 05/2013 by Dr [name]  . MGUS (monoclonal gammopathy of unknown significance) 01/25/2015    Past Surgical History  Procedure Laterality Date  . Tendon repair      right foot  . Shoulder arthroscopy    . Prostate surgery    . Clavicle surgery    . Chest tube insertion Right 01/29/2013  . Loop recorder implant  06/02/2013    Medtronic LinQ implanted for syncope by Dr [name]  . Loop recorder implant N/A 06/02/2013    Procedure: LOOP RECORDER IMPLANT;  Surgeon: [name], MD;  Location: [facility] CATH LAB;  Service: Cardiovascular;  Laterality: N/A;    Current Outpatient Prescriptions  Medication Sig Dispense Refill  . aspirin 81 MG tablet Take 81 mg by mouth daily.    . cholecalciferol (VITAMIN D) 1000 UNITS tablet Take 1,000 Units by mouth daily.    Marland Kitchen losartan (COZAAR) 25 MG tablet Take 1/2 tablet by mouth daily    . Multiple Vitamins-Minerals (MULTIVITAMIN WITH MINERALS) tablet Take 1 tablet by mouth daily.    . nortriptyline (PAMELOR) 25 MG capsule  Take 3 capsules (75 mg total) by mouth at bedtime. 90 capsule 3  . OLANZapine (ZYPREXA) 10 MG tablet Take 10 mg by mouth at bedtime as needed (as needed to ward off  cluster headaches.).     Marland Kitchen sildenafil (VIAGRA) 100 MG tablet Take 100 mg by mouth daily as needed for erectile dysfunction.    . valACYclovir (VALTREX) 500 MG tablet Take 500 mg by mouth daily.    Marland Kitchen Umeclidinium-Vilanterol (ANORO ELLIPTA) 62.5-25 MCG/INH AEPB Inhale 1 puff daily (Patient not taking: Reported on 09/15/2015) 60 each prn   No current facility-administered medications for this visit.    Allergies:    Allergies   Allergen Reactions  . Sulfa Antibiotics Hives    Hives     Social History:  The patient  reports that he quit smoking about 54 years ago. His smoking use included Cigarettes. He has a 15 pack-year smoking history. He has never used smokeless tobacco. He reports that he drinks about 6.0 oz of alcohol per week. He reports that he does not use illicit drugs.   ROS:  Please see the history of present illness.   Disequilibrium. No chest pain, no shortness of breath, no further syncopal episodes    PHYSICAL EXAM: VS:  BP 122/74 mmHg  Pulse 84  Ht 5\' 10"  (1.778 m)  Wt 163 lb 1.9 oz (73.991 kg)  BMI 23.41 kg/m2  SpO2 94% Well nourished, well developed, in no acute distress HEENT: normal Neck: no JVD Cardiac:  normal S1, S2; RRR; no murmur, no ectopic beat Lungs:  clear to auscultation bilaterally, no wheezing, rhonchi or rales Abd: soft, nontender, no hepatomegaly Ext: no edema Skin: warm and dry Neuro: no focal abnormalities noted  EKG:  None today. Previous showed PVCs.   Echocardiogram 11/13-EF 45%, trace AI  ASSESSMENT AND PLAN:  1. Paroxysmal atrial fibrillation-risks and benefits discussed. Age 55, and decreased ejection fraction score of at least 3. He is now amenable to anticoagulation. At prior visit with Dr. [name] he went to see if he had another episode. He did have another episode. Went over risks and benefits of this medication including bleeding. We will try Xarelto 20mg  QD 2. Disequilibrium- neurology has evaluated. MRA/MRI of brain have been reassuring. He has tried physical therapy. No recent medication initiations. No tremors. 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 He has been able to however go on a recent cruise to the [place], sailing vessel, star riggers.  3. Syncope-no further episodes. Implantable loop recorder has been recently interrogated and showed only atrial fibrillation brief episodes multiple, no significant  bradycardia Dr. Tanna Furry note reviewed. 4. PVCs-no ectopy heard on exam. He is status post radiofrequency ablation PVCs by Dr. [name].  5. Hypertension-currently well controlled. He was changed over to losartan to see if this would help the burning sensation in his throat but it did not. He is frustrated by the size of the pill. We will go ahead and change in back to lisinopril at his request 5 mg once a day. 6. Chronic systolic heart failure-very well compensated, no shortness of breath. NYHA class I. Recent ejection fraction 45%. ACE inhibitor. No further beta blocker due to bradycardia. 7. Two-month follow up stop aspirin, starting Xarelto  Signed, Candee Furbish, MD [facility]  10/11/2015 2:18 PM

## 2015-10-11 NOTE — Telephone Encounter (Signed)
Attempted to reach patient at home to let him know that per Dr. [name], the episode on his LINQ from 10/08/15 is atrial fib.  Strip given to Dr. [name] so that it can be discussed at the patient's appointment with him this afternoon.

## 2015-10-12 ENCOUNTER — Telehealth: Payer: Self-pay

## 2015-10-12 NOTE — Telephone Encounter (Signed)
Prior auth for Xarelto 20mg sent to [facility]. 

## 2015-10-13 ENCOUNTER — Telehealth: Payer: Self-pay

## 2015-10-13 NOTE — Telephone Encounter (Signed)
Xarelto 20mg  approved through 10/11/2016. [id number].

## 2015-10-18 ENCOUNTER — Ambulatory Visit (INDEPENDENT_AMBULATORY_CARE_PROVIDER_SITE_OTHER): Payer: Medicare Other | Admitting: *Deleted

## 2015-10-18 DIAGNOSIS — R55 Syncope and collapse: Secondary | ICD-10-CM

## 2015-10-19 NOTE — Progress Notes (Signed)
Carelink summary Report / Loop recorder 

## 2015-11-02 ENCOUNTER — Telehealth: Payer: Self-pay | Admitting: Internal Medicine

## 2015-11-02 NOTE — Telephone Encounter (Signed)
LMTCB/SSS  Per Levander Campion, RN-no AF since 10/08/15.

## 2015-11-02 NOTE — Telephone Encounter (Signed)
New Message  Pt wanted to see if remote transmission was sent successfully and if there were any additional AF episodes. Please call back and discuss.

## 2015-11-02 NOTE — Telephone Encounter (Signed)
Informed patient that he has not had any AF since 11/4. Patient voiced understanding.

## 2015-11-04 ENCOUNTER — Telehealth: Payer: Self-pay | Admitting: Cardiology

## 2015-11-04 NOTE — Telephone Encounter (Signed)
Pt c/o BP issue: STAT if pt c/o blurred vision, one-sided weakness or slurred speech  1. What are your last 5 BP readings? 156/107, 162/80  2. Are you having any other symptoms (ex. Dizziness, headache, blurred vision, passed out)? Achy in the chest  3. What is your BP issue? Pt states his BP is too high

## 2015-11-04 NOTE — Telephone Encounter (Signed)
Spoke with pt who is complaining his BP is "too high".  He has been checking his BP at home with an automatic cuff which he took to [facility] and checked it against theirs.  He has not recently changed the batteries in his monitor and will be doing this.  In reviewing his medications he states that he has not been taking his Lisinopril 5 mg a day for approximately 4 days now and has been taking Losartan instead.  When asked why he reports the Lisinopril seemed to cause his throat to burn therefore he was switched to Losartan.  Advised Dr [name] note states he was changed to Lisinopril from Losartan because of the throat burning issue.  Pt states he will change back to Lisinopril 5 mg a day, continue to monitor his BP and call back after being on the medication at least a week if BP continues to be elevated.  Dr [name] is aware.

## 2015-11-15 ENCOUNTER — Telehealth: Payer: Self-pay | Admitting: Cardiology

## 2015-11-15 NOTE — Telephone Encounter (Signed)
Returned call to patient and wife BP this morning 171/ BP reading over the past couple weeks ranging from 149-171/82-92 every morning Has headache every morning he wakes up this is typical. Wife said he is miserable, patient disagrees   Took himself off of blood thinner at the beginning of month] Was spitting up blood so he said he weaned himself off it.  Routed to Dr. [name] for advice

## 2015-11-15 NOTE — Telephone Encounter (Signed)
Pt c/o BP issue: STAT if pt c/o blurred vision, one-sided weakness or slurred speech  1. What are your last 5 BP readings?  171/? today  2. Are you having any other symptoms (ex. Dizziness, headache, blurred vision, passed out)? headaches  3. What is your BP issue? bp is very high.

## 2015-11-16 ENCOUNTER — Encounter: Payer: Self-pay | Admitting: Internal Medicine

## 2015-11-16 MED ORDER — LISINOPRIL 10 MG PO TABS: 10.0000 mg | ORAL_TABLET | Freq: Every day | ORAL | Status: DC

## 2015-11-16 NOTE — Telephone Encounter (Signed)
Increase lisinopril to 10mg  QD Have him follow up with Pharmacy clinic visit for BP check  Encourage use of anticoagulation to prevent stroke  Candee Furbish, MD

## 2015-11-16 NOTE — Telephone Encounter (Signed)
Pt aware to increase Lisinopril to 10 mg a day and was scheduled with the BP clinic for 1 week with Elberta Leatherwood.

## 2015-11-17 ENCOUNTER — Ambulatory Visit (INDEPENDENT_AMBULATORY_CARE_PROVIDER_SITE_OTHER): Payer: Medicare Other | Admitting: *Deleted

## 2015-11-17 DIAGNOSIS — R55 Syncope and collapse: Secondary | ICD-10-CM

## 2015-11-18 ENCOUNTER — Encounter: Payer: Self-pay | Admitting: Cardiology

## 2015-11-18 ENCOUNTER — Other Ambulatory Visit: Payer: Self-pay | Admitting: Internal Medicine

## 2015-11-18 NOTE — Progress Notes (Signed)
Carelink Summary Report / Loop Recorder 

## 2015-11-22 LAB — CUP PACEART REMOTE DEVICE CHECK: Date Time Interrogation Session: 20161115033734

## 2015-11-24 ENCOUNTER — Ambulatory Visit: Payer: Medicare Other | Admitting: Pharmacist

## 2015-11-25 ENCOUNTER — Encounter: Payer: Self-pay | Admitting: Cardiology

## 2015-11-30 ENCOUNTER — Ambulatory Visit: Payer: Medicare Other | Admitting: Internal Medicine

## 2015-12-03 ENCOUNTER — Ambulatory Visit (INDEPENDENT_AMBULATORY_CARE_PROVIDER_SITE_OTHER): Payer: Medicare Other | Admitting: Pharmacist

## 2015-12-03 ENCOUNTER — Encounter: Payer: Self-pay | Admitting: Pharmacist

## 2015-12-03 VITALS — BP 158/76 | HR 43

## 2015-12-03 DIAGNOSIS — I10 Essential (primary) hypertension: Secondary | ICD-10-CM | POA: Diagnosis not present

## 2015-12-03 MED ORDER — LISINOPRIL 20 MG PO TABS: 20.0000 mg | ORAL_TABLET | Freq: Every day | ORAL | Status: DC

## 2015-12-03 NOTE — Patient Instructions (Signed)
Increase Lisinopril from 10mg  to 20mg  for increased blood pressure control  Please keep follow-up appointment with Dr. [name]  We will see you back in 3 months to check on blood pressure

## 2015-12-03 NOTE — Progress Notes (Signed)
Patient ID: Brian Hoover   DOB: Aug 23, 2032     MRN: [id number]     HPI: Brian Hoover is a 81 y.o. male referred by Dr. [name] to HTN clinic.   Patient arrives today with his blood pressure readings from home (see below). He states he has been doing well on the increased dose of lisinopril.  He also says he believes the throat irritation may be due to age and not the medication. 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 He states he is still experiencing headaches regularly.   He also had questions about his risk for clot with atrial fibrillation since he has stopped the Xarelto. He was very aware of his yearly and 10 year risk despite his questions. He stated he was more concerned with the side effects at his age than the risk of having a stroke. While on Xarelto he states he was coughing up blood and that was very bothersome to him. He also states he was experiencing dark tarry stools, which resolved after discontinuing therapy. He also stated he was not keen on going back on the medication despite our discussion of the benefits, especially since he believed he had not had any episodes of afib. He also stated he had informed Dr. [name] that he no longer wished to be on the medications. Per chart notes Dr. [name] is aware and encouraged him to remain on the medication.   Current HTN meds:  Lisinopril 10mg  once daily  Previously tried:  Losartan  BP goal: <150/90  Diet:  States he has previously been told his sodium is low; thus he does not monitor his sodium intake but states it is not in excess.   Exercise:  Patient usually works out 3 times a week at [employer], but has been off his schedule with the holidays.   Home BP readings:  114-169/60-93 (with the majority of the readings being high150s-160/80s)   Wt Readings from Last 3 Encounters:  10/11/15 163 lb 1.9 oz (73.991 kg)  09/15/15 167 lb (75.751  kg)  08/10/15 165 lb (74.844 kg)   BP Readings from Last 3 Encounters:  10/11/15 122/74  09/15/15 130/72  08/10/15 122/80   Pulse Readings from Last 3 Encounters:  10/11/15 84  09/15/15 64  08/10/15 73    Renal function: CrCl cannot be calculated (Unknown ideal weight.).  Past Medical History  Diagnosis Date  . Chronic systolic heart failure ([place])   . PVC (premature ventricular contraction)   . Bradycardia   . COPD (chronic obstructive pulmonary disease) ([place])   . Dyspnea   . Pneumothorax 01/28/2013  . CHF (congestive heart failure) ([place])   . GERD (gastroesophageal reflux disease)   . Migraine   . Syncope     s/p Medtronic ILR implant 05/2013 by Dr [name]  . MGUS (monoclonal gammopathy of unknown significance) 01/25/2015    Current Outpatient Prescriptions on File Prior to Visit  Medication Sig Dispense Refill  . cholecalciferol (VITAMIN D) 1000 UNITS tablet Take 1,000 Units by mouth daily.    Marland Kitchen losartan (COZAAR) 25 MG tablet TAKE 1/2 TABLET BY MOUTH DAILY 30 tablet 0  . Multiple Vitamins-Minerals (MULTIVITAMIN WITH MINERALS) tablet Take 1 tablet by mouth daily.    . nortriptyline (PAMELOR) 25 MG capsule Take 3 capsules (75 mg total) by mouth at bedtime. 90 capsule 3  . OLANZapine (ZYPREXA) 10 MG tablet Take  10 mg by mouth at bedtime as needed (as needed to ward off  cluster headaches.).     Marland Kitchen rivaroxaban (XARELTO) 20 MG TABS tablet Take 1 tablet (20 mg total) by mouth daily with supper. 30 tablet 6  . sildenafil (VIAGRA) 100 MG tablet Take 100 mg by mouth daily as needed for erectile dysfunction.    Marland Kitchen Umeclidinium-Vilanterol (ANORO ELLIPTA) 62.5-25 MCG/INH AEPB Inhale 1 puff daily (Patient not taking: Reported on 09/15/2015) 60 each prn  . valACYclovir (VALTREX) 500 MG tablet Take 500 mg by mouth daily.     No current facility-administered medications on file prior to visit.    Allergies  Allergen Reactions  . Sulfa Antibiotics Hives    Hives       Assessment/Plan: Hypertension: Patient is at not at goal <150/90 and is symptomatic with frequent headaches. Plan to increase lisinopril to 20mg  once a day. Recheck BMET at next visit. Encouraged him to get back on his exercise schedule in the new year.   Afib/Anticoagulation:  CHADS2: 3. Encouraged him to continue discussion about Xarelto with Dr. [name] at visit in January.   Lelan Pons. [place], [place]  [id number] [place], [place] [address]  Phone: [phone]; Fax: [phone] 12/03/2015 12:17 PM

## 2015-12-13 ENCOUNTER — Encounter: Payer: Self-pay | Admitting: Cardiology

## 2015-12-13 ENCOUNTER — Ambulatory Visit (INDEPENDENT_AMBULATORY_CARE_PROVIDER_SITE_OTHER): Payer: Medicare Other | Admitting: Cardiology

## 2015-12-13 VITALS — BP 138/82 | HR 56 | Ht 70.0 in | Wt 173.0 lb

## 2015-12-13 DIAGNOSIS — R55 Syncope and collapse: Secondary | ICD-10-CM

## 2015-12-13 DIAGNOSIS — I480 Paroxysmal atrial fibrillation: Secondary | ICD-10-CM | POA: Diagnosis not present

## 2015-12-13 DIAGNOSIS — I493 Ventricular premature depolarization: Secondary | ICD-10-CM

## 2015-12-13 DIAGNOSIS — I5022 Chronic systolic (congestive) heart failure: Secondary | ICD-10-CM | POA: Diagnosis not present

## 2015-12-13 DIAGNOSIS — I10 Essential (primary) hypertension: Secondary | ICD-10-CM | POA: Diagnosis not present

## 2015-12-13 NOTE — Patient Instructions (Addendum)
Medication Instructions:  Please take ASA 81 mg a day.  Let us know when you are willing to restart Xarelto. Continue all other medications as listed.  Follow-Up: Follow up in 6 months with Dr. [name].  You will receive a letter in the mail 2 months before you are due.  Please call us when you receive this letter to schedule your follow up appointment.  If you need a refill on your cardiac medications before your next appointment, please call your pharmacy.  Thank you for choosing [facility]!!

## 2015-12-13 NOTE — Progress Notes (Signed)
[facility]. [address], [address] [place], [place]  [address] Phone: [phone] Fax:  [phone]  Date:  12/13/2015   ID:  Brian Hoover, DOB 10/18/1932, MRN [id number]  PCP:  [name], MD   History of Present Illness: Brian Hoover is a 82 y.o. male here for follow up paroxysmal atrial fibrillation, hypertension. History of frequent falls. Prior cardiac history includes frequent PVCs which were successfully ablated by Dr. [name] with resolution of symptoms. During those PVCs, he ended up having cardiomyopathy with decreased ejection fraction. At last check, his EF was 45% in 2013.    Previously, was walking 3 miles into walk. Was dizzy, felt completely off-balance, did not lose consciousness. Collapsed, fell tore forehead. He tried to get up but ended up leaning on trash cans. Very concerning to him. He did not feel palpitations previously. He notes that he woke up a few months ago and he noticed that the room was spinning. He has had this off and on for several months. He notes that if he bends down and then gets up quickly, he will have some dizziness.  Event monitor placed a November 2013 did demonstrate short burst of nonsustained ventricular tachycardia. Because of this, he ended up seeing Dr. Crissie Sickles once again. Since he did not have any recurrence of episodes at that time, Dr. [name] endorsed implantable loop recorder. On interrogation showed no sustained arrhythmias or bradycardic episodes, however atrial fibrillation was noted.  He has also seen neurology regarding this matter. Unfortunately, he sustained  fall previously at [facility] fractured ribs, pneumothorax. He slipped on wet shower floor. Clearly no syncope. Since then, right foot drop issues. MRI/MRA back, EMG OK. Perhaps mild difficulty from sacral spine to hip.   Despite this, he has been enjoying himself, went on a sailing cruise.    Wt Readings from Last 3 Encounters:  12/13/15 173 lb (78.472 kg)  10/11/15 163  lb 1.9 oz (73.991 kg)  09/15/15 167 lb (75.751 kg)     Past Medical History  Diagnosis Date  . Chronic systolic heart failure ([place])   . PVC (premature ventricular contraction)   . Bradycardia   . COPD (chronic obstructive pulmonary disease) ([place])   . Dyspnea   . Pneumothorax 01/28/2013  . CHF (congestive heart failure) ([place])   . GERD (gastroesophageal reflux disease)   . Migraine   . Syncope     s/p Medtronic ILR implant 05/2013 by Dr [name]  . MGUS (monoclonal gammopathy of unknown significance) 01/25/2015    Past Surgical History  Procedure Laterality Date  . Tendon repair      right foot  . Shoulder arthroscopy    . Prostate surgery    . Clavicle surgery    . Chest tube insertion Right 01/29/2013  . Loop recorder implant  06/02/2013    Medtronic LinQ implanted for syncope by Dr [name]  . Loop recorder implant N/A 06/02/2013    Procedure: LOOP RECORDER IMPLANT;  Surgeon: [name], MD;  Location: [facility] CATH LAB;  Service: Cardiovascular;  Laterality: N/A;    Current Outpatient Prescriptions  Medication Sig Dispense Refill  . aspirin EC 81 MG tablet Take 81 mg by mouth daily.    . cholecalciferol (VITAMIN D) 1000 UNITS tablet Take 1,000 Units by mouth daily.    Marland Kitchen lisinopril (PRINIVIL,ZESTRIL) 20 MG tablet Take 1 tablet (20 mg total) by mouth daily. 90 tablet 1  . Multiple Vitamins-Minerals (MULTIVITAMIN WITH MINERALS) tablet Take 1  tablet by mouth daily.    Marland Kitchen OLANZapine (ZYPREXA) 10 MG tablet Take 10 mg by mouth at bedtime as needed (as needed to ward off  cluster headaches.).     Marland Kitchen sildenafil (VIAGRA) 100 MG tablet Take 100 mg by mouth daily as needed for erectile dysfunction.    . valACYclovir (VALTREX) 500 MG tablet Take 500 mg by mouth daily.     No current facility-administered medications for this visit.    Allergies:    Allergies  Allergen Reactions  . Sulfa Antibiotics Hives    Hives     Social History:  The patient  reports that he quit smoking about 55  years ago. His smoking use included Cigarettes. He has a 15 pack-year smoking history. He has never used smokeless tobacco. He reports that he drinks about 6.0 oz of alcohol per week. He reports that he does not use illicit drugs.   ROS:  Please see the history of present illness.   Disequilibrium. No chest pain, no shortness of breath, no further syncopal episodes    PHYSICAL EXAM: VS:  BP 138/82 mmHg  Pulse 56  Ht 5\' 10"  (1.778 m)  Wt 173 lb (78.472 kg)  BMI 24.82 kg/m2 Well nourished, well developed, in no acute distress HEENT: normal Neck: no JVD Cardiac:  normal S1, S2; RRR; no murmur, no ectopic beat Lungs:  clear to auscultation bilaterally, no wheezing, rhonchi or rales Abd: soft, nontender, no hepatomegaly Ext: no edema Skin: warm and dry Neuro: no focal abnormalities noted  EKG:  None today. Previous showed PVCs.   Echocardiogram 11/13-EF 45%, trace AI  ASSESSMENT AND PLAN:  1. Paroxysmal atrial fibrillation-risks and benefits discussed. Age 37, and decreased ejection fraction score of at least 3. We started Xarelto for him however he did up stopping this because of increased bleeding. He was noticing clotted blood in his mouth repeatedly, sometimes darker stools. After stopping the Xarelto all of this has subsided and is relieved. We had a lengthy discussion about the importance of anticoagulation and reduction of stroke. Encouraged him to seek the care of dentist, he has not seen in a while, to make sure that he does not have any gum disease that would lead to bleeding. Explained to him that often we are able to control the minor bleeding in order to help prevent a major stroke. At this time, he does not wish to go back on that medication. He is currently taking low-dose aspirin. 2. Disequilibrium- neurology has evaluated. MRA/MRI of brain have been reassuring. He has tried physical therapy. No recent medication initiations. No tremors. Frustrating for him especially since he  was quite vigorous, skier Judithe Modest. He has been able to however go on a recent cruise to the [place], sailing vessel, star riggers.  3. Syncope-no further episodes. Implantable loop recorder has been recently interrogated and showed only atrial fibrillation brief episodes multiple, no significant bradycardia Dr. Tanna Furry note reviewed. 4. PVCs-no ectopy heard on exam. He is status post radiofrequency ablation PVCs by Dr. [name].  5. Hypertension-currently improved controlled. Appreciate Nuccio in the pharmacy clinic helping to titrate lisinopril. He is currently on 20 mg. In the distant past he remembers having a "scratchy throat "with lisinopril perhaps. Currently he is tolerating this well. Continue. 6. Chronic systolic heart failure-very well compensated, no shortness of breath. NYHA class I. Recent ejection fraction 45%. ACE inhibitor. No further beta blocker due to bradycardia. 7. 51-month follow up   Signed, Candee Furbish, MD [facility]  12/13/2015 2:11 PM

## 2015-12-17 ENCOUNTER — Ambulatory Visit (INDEPENDENT_AMBULATORY_CARE_PROVIDER_SITE_OTHER): Payer: Medicare Other | Admitting: *Deleted

## 2015-12-17 DIAGNOSIS — R55 Syncope and collapse: Secondary | ICD-10-CM

## 2015-12-20 NOTE — Progress Notes (Signed)
Carelink Summary Report / Loop Recorder 

## 2016-01-01 LAB — CUP PACEART REMOTE DEVICE CHECK: MDC IDC SESS DTM: 20161215033641

## 2016-01-05 ENCOUNTER — Other Ambulatory Visit: Payer: Self-pay | Admitting: Neurology

## 2016-01-05 NOTE — Telephone Encounter (Signed)
Last OV: 08/10/15 Next OV: 02/07/16 Will taper off nortriptyline by 25mg  daily per week

## 2016-01-17 ENCOUNTER — Ambulatory Visit (INDEPENDENT_AMBULATORY_CARE_PROVIDER_SITE_OTHER): Payer: Medicare Other | Admitting: *Deleted

## 2016-01-17 DIAGNOSIS — R55 Syncope and collapse: Secondary | ICD-10-CM | POA: Diagnosis not present

## 2016-01-17 NOTE — Progress Notes (Signed)
Carelink Summary Report / Loop Recorder 

## 2016-01-20 ENCOUNTER — Telehealth: Payer: Self-pay | Admitting: Pharmacist

## 2016-01-20 ENCOUNTER — Encounter: Payer: Self-pay | Admitting: Internal Medicine

## 2016-01-20 ENCOUNTER — Ambulatory Visit (INDEPENDENT_AMBULATORY_CARE_PROVIDER_SITE_OTHER): Payer: Medicare Other | Admitting: *Deleted

## 2016-01-20 ENCOUNTER — Ambulatory Visit (INDEPENDENT_AMBULATORY_CARE_PROVIDER_SITE_OTHER): Payer: Medicare Other | Admitting: Pharmacist

## 2016-01-20 DIAGNOSIS — I10 Essential (primary) hypertension: Secondary | ICD-10-CM | POA: Diagnosis not present

## 2016-01-20 DIAGNOSIS — Z4509 Encounter for adjustment and management of other cardiac device: Secondary | ICD-10-CM

## 2016-01-20 LAB — CUP PACEART INCLINIC DEVICE CHECK: Date Time Interrogation Session: 20170216171427

## 2016-01-20 LAB — BASIC METABOLIC PANEL
BUN: 22 mg/dL (ref 7–25)
CALCIUM: 9.3 mg/dL (ref 8.6–10.3)
CO2: 29 mmol/L (ref 20–31)
CREATININE: 1.06 mg/dL (ref 0.70–1.11)
Chloride: 100 mmol/L (ref 98–110)
Glucose, Bld: 95 mg/dL (ref 65–99)
Potassium: 4 mmol/L (ref 3.5–5.3)
Sodium: 136 mmol/L (ref 135–146)

## 2016-01-20 NOTE — Progress Notes (Signed)
Loop check in clinic- add on for Gay Filler to check rhythm. SR 62 w/ PVCs. Battery status: good. R-waves 0.9mV. 1 pause episode- nocturnal, undersensing. 1 AF episode (<0.1% burden)- no EGM due to programming, reviewed on Carelink- 10 mins on 10/08/15, patient was on xarelto at the time, currently not anticoagulated. Programming changed to AT/AF recording- all episodes. Monthly summary reports and ROV with GT in April.

## 2016-01-20 NOTE — Telephone Encounter (Signed)
New message      Pt c/o medication issue:  1. Name of Medication: lisinopril 2. How are you currently taking this medication (dosage and times per day)? 20mg  daily 3. Are you having a reaction (difficulty breathing--STAT)? no 4. What is your medication issue?  Pt states that the medication is not working.  Bp is still 161/81.  Please advise

## 2016-01-20 NOTE — Progress Notes (Signed)
Patient ID: Brian Hoover   DOB: 2032/05/29     MRN: [id number]     HPI: KELON CONKIN is a 82 y.o. male referred by Dr. [name] to HTN clinic.  He was seen in the HTN clinic at the end of December.  His lisinopril was increased from 10mg  daily to 20mg  daily.  At his follow up visit with Dr. [name] his BP was controlled at 138/82 so no changes were made.  Pt called the office this morning to report his BP has consistently been > 150 and in the 123456 systolic at home.  He is concerned the lisinopril is not working anymore so he is here today to verify his home readings and make adjustments.      Current HTN meds:  Lisinopril 20mg  once daily  Previously tried:  Losartan Per Dr. [name], avoid BB due to history of bradycardia  BP goal: <150/90  Diet:  States he has previously been told his sodium is low; thus he does not monitor his sodium intake but states it is not in excess.   Exercise:  Patient usually works out 3 times a week at [employer], but has been off his schedule with the holidays.   Home BP readings:  114-169/60-93 (with the majority of the readings being high150s-160/80s)   Wt Readings from Last 3 Encounters:  12/13/15 173 lb (78.472 kg)  10/11/15 163 lb 1.9 oz (73.991 kg)  09/15/15 167 lb (75.751 kg)   BP Readings from Last 3 Encounters:  12/13/15 138/82  12/03/15 158/76  10/11/15 122/74   Pulse Readings from Last 3 Encounters:  12/13/15 56  12/03/15 43  10/11/15 84    Renal function: CrCl cannot be calculated (Unknown ideal weight.).  Past Medical History  Diagnosis Date  . Chronic systolic heart failure ([place])   . PVC (premature ventricular contraction)   . Bradycardia   . COPD (chronic obstructive pulmonary disease) ([place])   . Dyspnea   . Pneumothorax 01/28/2013  . CHF (congestive heart failure) ([place])   . GERD (gastroesophageal reflux disease)   . Migraine   . Syncope     s/p Medtronic ILR implant 05/2013 by Dr [name]  . MGUS (monoclonal gammopathy of  unknown significance) 01/25/2015    Current Outpatient Prescriptions on File Prior to Visit  Medication Sig Dispense Refill  . aspirin EC 81 MG tablet Take 81 mg by mouth daily.    . cholecalciferol (VITAMIN D) 1000 UNITS tablet Take 1,000 Units by mouth daily.    Marland Kitchen lisinopril (PRINIVIL,ZESTRIL) 20 MG tablet Take 1 tablet (20 mg total) by mouth daily. 90 tablet 1  . Multiple Vitamins-Minerals (MULTIVITAMIN WITH MINERALS) tablet Take 1 tablet by mouth daily.    . nortriptyline (PAMELOR) 25 MG capsule Take 2 capsules (50 mg total) by mouth at bedtime. 60 capsule 3  . OLANZapine (ZYPREXA) 10 MG tablet Take 10 mg by mouth at bedtime as needed (as needed to ward off  cluster headaches.).     Marland Kitchen sildenafil (VIAGRA) 100 MG tablet Take 100 mg by mouth daily as needed for erectile dysfunction.    . valACYclovir (VALTREX) 500 MG tablet Take 500 mg by mouth daily.     No current facility-administered medications on file prior to visit.    Allergies  Allergen Reactions  . Sulfa Antibiotics Hives    Hives      Assessment/Plan: Hypertension: Pt's BP remains elevated despite increase in lisinopril.  His BMET had not been checked since the last  visit.  Will check and determine next steps once those are available.   Elberta Leatherwood, PharmD, BCPS, [facility]  [id number] [place], [place] [address]  Phone: [phone]; Fax: [phone] 01/20/2016 1:56 PM   ADDENDUM:   Reviewed pt's labs.  SCr, K and Na stable.  Will add chlorthalidone to pt's medication and recheck BP and BMET in 2 weeks.  Discussed with patient.  Rx sent to pharmacy.  Aris Georgia PharmD 01/21/16

## 2016-01-20 NOTE — Telephone Encounter (Signed)
Spoke with pt.  He will come in this afternoon with his cuff to verify his BP from home.  Will also need a BMET before we make any medication adjustments.

## 2016-01-21 MED ORDER — CHLORTHALIDONE 25 MG PO TABS: 25.0000 mg | ORAL_TABLET | Freq: Every day | ORAL | Status: DC

## 2016-01-24 ENCOUNTER — Other Ambulatory Visit: Payer: Self-pay | Admitting: Ophthalmology

## 2016-01-24 DIAGNOSIS — H5000 Unspecified esotropia: Secondary | ICD-10-CM

## 2016-01-24 DIAGNOSIS — G4452 New daily persistent headache (NDPH): Secondary | ICD-10-CM

## 2016-01-24 DIAGNOSIS — H547 Unspecified visual loss: Secondary | ICD-10-CM

## 2016-02-01 ENCOUNTER — Ambulatory Visit
Admission: RE | Admit: 2016-02-01 | Discharge: 2016-02-01 | Disposition: A | Discharge status: Home / Self Care | Payer: Medicare Other | Source: Ambulatory Visit | Attending: Ophthalmology | Admitting: Ophthalmology

## 2016-02-01 DIAGNOSIS — H5000 Unspecified esotropia: Secondary | ICD-10-CM

## 2016-02-01 DIAGNOSIS — G4452 New daily persistent headache (NDPH): Secondary | ICD-10-CM

## 2016-02-01 DIAGNOSIS — H547 Unspecified visual loss: Secondary | ICD-10-CM

## 2016-02-01 MED ORDER — GADOBENATE DIMEGLUMINE 529 MG/ML IV SOLN
15.0000 mL | Freq: Once | INTRAVENOUS | Status: AC | PRN
  Administered 2016-02-01: 15 mL via INTRAVENOUS

## 2016-02-03 ENCOUNTER — Ambulatory Visit (INDEPENDENT_AMBULATORY_CARE_PROVIDER_SITE_OTHER): Payer: Medicare Other | Admitting: Pharmacist

## 2016-02-03 VITALS — BP 134/86 | HR 60

## 2016-02-03 DIAGNOSIS — I10 Essential (primary) hypertension: Secondary | ICD-10-CM

## 2016-02-03 LAB — BASIC METABOLIC PANEL
BUN: 21 mg/dL (ref 7–25)
CHLORIDE: 89 mmol/L — AB (ref 98–110)
CO2: 28 mmol/L (ref 20–31)
CREATININE: 1.03 mg/dL (ref 0.70–1.11)
Calcium: 9.1 mg/dL (ref 8.6–10.3)
Glucose, Bld: 87 mg/dL (ref 65–99)
POTASSIUM: 4 mmol/L (ref 3.5–5.3)
Sodium: 127 mmol/L — ABNORMAL LOW (ref 135–146)

## 2016-02-03 NOTE — Progress Notes (Signed)
Patient ID: Brian Hoover   DOB: 12/08/2031     MRN: [id number]     HPI: Brian Hoover is a 82 y.o. male referred by Dr. [name] to HTN clinic.  He was seen in the HTN clinic at the end of December.  His lisinopril was increased from 10mg  daily to 20mg  daily.  At his follow up visit with Dr. [name] his BP was controlled at 138/82 so no changes were made.  Pt called the office last month to report his BP has consistently been > 150 and in the 123456 systolic at home.  He is concerned the lisinopril is not working anymore so he is came in to verify his home readings and make adjustments.  His office BP was also elevated so chlorthalidone was added.   Pt returns today for 2 week follow up after adding chlorthalidone.  His BP is much improved.  He has had 1 home reading > 140 and all the others at goal.  Pt states he feels better.  He has not noticed an increase in dizziness or lightheadedness.  He did have 1 issue of legs giving out on him while singing in choir this week but this has happened several times in the past and not thought to be cardiac related.    Current HTN meds:  Lisinopril 20mg  once daily Chlorthalidone 25mg  daily  Previously tried:  Losartan Per Dr. [name], avoid BB due to history of bradycardia  BP goal: <150/90  Diet:  States he has previously been told his sodium is low; thus he does not monitor his sodium intake but states it is not in excess.   Exercise:  Patient usually works out 3 times a week at [employer], but has been off his schedule with the holidays.   Home BP readings:  112-154/59-73 (average of readings: 130/84)   Wt Readings from Last 3 Encounters:  12/13/15 173 lb (78.472 kg)  10/11/15 163 lb 1.9 oz (73.991 kg)  09/15/15 167 lb (75.751 kg)   BP Readings from Last 3 Encounters:  12/13/15 138/82  12/03/15 158/76  10/11/15 122/74   Pulse Readings from Last 3 Encounters:  12/13/15 56  12/03/15 43  10/11/15 84    Renal function: CrCl cannot be  calculated (Unknown ideal weight.).  Past Medical History  Diagnosis Date  . Chronic systolic heart failure ([place])   . PVC (premature ventricular contraction)   . Bradycardia   . COPD (chronic obstructive pulmonary disease) ([place])   . Dyspnea   . Pneumothorax 01/28/2013  . CHF (congestive heart failure) ([place])   . GERD (gastroesophageal reflux disease)   . Migraine   . Syncope     s/p Medtronic ILR implant 05/2013 by Dr [name]  . MGUS (monoclonal gammopathy of unknown significance) 01/25/2015    Current Outpatient Prescriptions on File Prior to Visit  Medication Sig Dispense Refill  . aspirin EC 81 MG tablet Take 81 mg by mouth daily.    . chlorthalidone (HYGROTON) 25 MG tablet Take 1 tablet (25 mg total) by mouth daily. 90 tablet 1  . cholecalciferol (VITAMIN D) 1000 UNITS tablet Take 1,000 Units by mouth daily.    Marland Kitchen lisinopril (PRINIVIL,ZESTRIL) 20 MG tablet Take 1 tablet (20 mg total) by mouth daily. 90 tablet 1  . Multiple Vitamins-Minerals (MULTIVITAMIN WITH MINERALS) tablet Take 1 tablet by mouth daily.    . nortriptyline (PAMELOR) 25 MG capsule Take 2 capsules (50 mg total) by mouth at bedtime. 60 capsule 3  .  OLANZapine (ZYPREXA) 10 MG tablet Take 10 mg by mouth at bedtime as needed (as needed to ward off  cluster headaches.).     Marland Kitchen sildenafil (VIAGRA) 100 MG tablet Take 100 mg by mouth daily as needed for erectile dysfunction.    . valACYclovir (VALTREX) 500 MG tablet Take 500 mg by mouth daily.     No current facility-administered medications on file prior to visit.    Allergies  Allergen Reactions  . Sulfa Antibiotics Hives    Hives      Assessment/Plan: Hypertension: Pt's BP at goal with the addition of chlorthalidone.  He is tolerating therapy well.  Will recheck BMET today.  Continue current medications.   Elberta Leatherwood, PharmD, BCPS, [facility]  [id number] [place], [place] [address]  Phone: [phone]; Fax: [phone] 02/03/2016 11:22 AM

## 2016-02-07 ENCOUNTER — Encounter: Payer: Self-pay | Admitting: Neurology

## 2016-02-07 ENCOUNTER — Ambulatory Visit (INDEPENDENT_AMBULATORY_CARE_PROVIDER_SITE_OTHER): Payer: Medicare Other | Admitting: Neurology

## 2016-02-07 VITALS — BP 120/74 | HR 52 | Ht 70.0 in | Wt 174.0 lb

## 2016-02-07 DIAGNOSIS — H4910 Fourth [trochlear] nerve palsy, unspecified eye: Secondary | ICD-10-CM

## 2016-02-07 DIAGNOSIS — R413 Other amnesia: Secondary | ICD-10-CM

## 2016-02-07 DIAGNOSIS — G44219 Episodic tension-type headache, not intractable: Secondary | ICD-10-CM | POA: Diagnosis not present

## 2016-02-07 DIAGNOSIS — R200 Anesthesia of skin: Secondary | ICD-10-CM

## 2016-02-07 DIAGNOSIS — H532 Diplopia: Secondary | ICD-10-CM | POA: Diagnosis not present

## 2016-02-07 DIAGNOSIS — R208 Other disturbances of skin sensation: Secondary | ICD-10-CM

## 2016-02-07 DIAGNOSIS — M545 Low back pain: Secondary | ICD-10-CM

## 2016-02-07 DIAGNOSIS — I619 Nontraumatic intracerebral hemorrhage, unspecified: Secondary | ICD-10-CM | POA: Diagnosis not present

## 2016-02-07 DIAGNOSIS — I10 Essential (primary) hypertension: Secondary | ICD-10-CM | POA: Diagnosis not present

## 2016-02-07 MED ORDER — NORTRIPTYLINE HCL 25 MG PO CAPS: 75.0000 mg | ORAL_CAPSULE | Freq: Every day | ORAL | Status: DC

## 2016-02-07 NOTE — Progress Notes (Signed)
NEUROLOGY FOLLOW UP OFFICE NOTE  JEFF FRIEDEN [id number]  HISTORY OF PRESENT ILLNESS: Brian Hoover is an 82 year old right-handed man with cluster headaches, COPD, HSV II, CHF, GERD, chronic systolic heart failure, PVCs, and history of pheumothorax follows up for cluster headache, chronic tension-type headache and memory problems, as well as new issue, namely diplopia.  History obtained by patient and cardiology note.  Imaging of recent MRI of brain and orbits reviewed.  HEADACHE: Update: Headaches had been well-controlled while on nortriptyline, but it caused drowsiness.  So we tapered down the nortriptyline to see how he does.  He developed increase in headaches, about once every 3 days.  Nortriptyline was increased to 6m at bedtime.  Headaches still occur sporadically.  He developed vertical diplopia several months ago, particularly noticeable when he was driving.  It has definitely improved and now only notices it when he has his head tilted back and is looking down.  He saw ophthalmologist, Dr. BJola Schmidt who diagnosed a fourth nerve palsy.  MRI of the brain and orbits with and without contrast from 02/01/16 showed a focal area of remote hemorrhage just inferior to the third nerve nucleus and near the fourth nerve nucleus.  He reports fluctuating blood pressure lately.  Systolic blood pressure has been as high as 168.  He was on a diuretic which was discontinued due to hyponatremia.  He is being treated by his cardiologist.  History: He has history of cluster headaches for over 20 years as well as other headaches for many years.  [facility]  His cluster headaches are described as involving the right eye, 10/10 intensity, sharp stabbing pain, lasting one hour.  It would occur between 1:30 and 2 am in the morning.  He was previously treated with verapamil, which caused constipation.  He had used O2 and nasal sprays.  For many years, he has  been taking Zyprexa.  He takes it as needed, only when he feels he has a cluster headache.  His last cluster headache was in 2012.  He also has other type of headache.  It is bi-frontal, non-throbbing ache, about 6-7/10.  It is not associated with other symptoms such as nausea or photophobia.  It can last all day and typically occurs 4 times a month.  Often, he wakes up with it.  Sometimes, it would lead into a cluster, so he has taken Zyprexa recently for it, but it has not helped.  He was advised to start taking the Zyprexa daily.  He takes ASA or ibuprofen sparingly.  They only take the edge off.  Drinking two cups of coffee treats it well.  He drinks coffee daily.    DIZZINESS/PRESYNCOPE: He has had episodes of feeling "woozy" for 2-3 years.  One time it occurred after a 3 mile walk and another time it occurred when he stood up while walking in his garden.  He has not lost consciousness.  There is no spinning sensation but he says that the "room swims".  MRI of the brain and MRA of head from 11/05/12 were unremarkable.  MRA of neck showed no stenosis except for some smooth plaque at the origin of the right ICA.  Orthostatic testing was reportedly negative.  He also reports that he sometimes feels very cold in warm weather.  GAIT INSTABILITY Update: Repeat testing of SPEP/IFE from December again showed an M-Spike with abnormal IgA lambda, consistent with MGUS.  He was referred to  Dr. Alvy Bimler of Heme/Onc.  It was recommended to monitor for now. He continues to have non-radiating back pain with numbness in the legs with prolonged standing, unchanged.  A NCV-EMG performed on 01/14/15 showed chronic right L3-L5 radiculopathy but no evidence of generalized sensorimotor polyneuropathy.  To further evaluate gait instability, an MRI of the cervical spine was performed on 01/27/15, which did not reveal spinal stenosis.  History: He was also evaluated for balance issues.  This has been ongoing for 2-3 years.   He denies numbness in the feet or weakness in the legs.  He tore his right achilles heel at that time and had right foot weakness, which improved.  NCV-EMG from 03/17/13 showed evidence of active and chronic right L5 radiculopathy but no evidence of polyneuropathy.  MRI of lumbar spine from 03/20/13 showed multi-level disc bulging and facet hypertrophy but no spinal stenosis or significant foraminal narrowing or cause for L5 radiculopathy.  Neuropathy labs were performed on 01/13/14, revealing small (0.3) M-spike. ANA, Sed Rate, RF, TSH, Hgb A1c, vitamin D 25-hydroxy, and B12 were unremarkable.   MEMORY PROBLEMS: Update: Stable.  He feels it is better.  History: He has noticed some memory difficulties for about 2 years.  Specifically, he notes some problems recalling names of casual acquaintances (people he has only met maybe a couple of times).  Sometimes, when he is driving on familiar routes to places he frequents, he needs to take a moment to re-orient himself.  This occurs with places such as restaurants, but not places he goes to often, such as the grocery store.  He denies problems with word-finding or remembering to take medication.  He manages his finances without difficulty.  He does not repeat questions.  He does not exhibit REM sleep behavior disorder.  He has not had any personality or behavioral changes.  He denies hallucinations.  There is no known family history of dementia or cognitive impairment.  He has a Brewing technologist.  MOCA from 04/26/15 was 24/30.  MRI of the brain from 11/05/12 was unremarkable.   PAST MEDICAL HISTORY: Past Medical History  Diagnosis Date  . Chronic systolic heart failure ([place])   . PVC (premature ventricular contraction)   . Bradycardia   . COPD (chronic obstructive pulmonary disease) ([place])   . Dyspnea   . Pneumothorax 01/28/2013  . CHF (congestive heart failure) ([place])   . GERD (gastroesophageal reflux disease)   . Migraine   . Syncope     s/p Medtronic ILR  implant 05/2013 by Dr [name]  . MGUS (monoclonal gammopathy of unknown significance) 01/25/2015    MEDICATIONS: Current Outpatient Prescriptions on File Prior to Visit  Medication Sig Dispense Refill  . aspirin EC 81 MG tablet Take 81 mg by mouth daily.    . cholecalciferol (VITAMIN D) 1000 UNITS tablet Take 1,000 Units by mouth daily.    Marland Kitchen lisinopril (PRINIVIL,ZESTRIL) 20 MG tablet Take 1 tablet (20 mg total) by mouth daily. 90 tablet 1  . Multiple Vitamins-Minerals (MULTIVITAMIN WITH MINERALS) tablet Take 1 tablet by mouth daily.    Marland Kitchen OLANZapine (ZYPREXA) 10 MG tablet Take 10 mg by mouth at bedtime as needed (as needed to ward off  cluster headaches.).     Marland Kitchen sildenafil (VIAGRA) 100 MG tablet Take 100 mg by mouth daily as needed for erectile dysfunction.    . valACYclovir (VALTREX) 500 MG tablet Take 500 mg by mouth daily.     No current facility-administered medications on file prior to visit.  ALLERGIES: Allergies  Allergen Reactions  . Sulfa Antibiotics Hives    Hives     FAMILY HISTORY: Family History  Problem Relation Age of Onset  . Breast cancer Mother   . Uterine cancer Mother   . Rheum arthritis Son     SOCIAL HISTORY: Social History   Social History  . Marital Status: Married    Spouse Name: [name]  . Number of Children: 2  . Years of Education: MA   Occupational History  . retired Chief Financial Officer    Social History Main Topics  . Smoking status: Former Smoker -- 1.50 packs/day for 10 years    Types: Cigarettes    Quit date: 12/04/1960  . Smokeless tobacco: Never Used  . Alcohol Use: 6.0 oz/week    10 Shots of liquor per week     Comment: 4 Drinks a week  . Drug Use: No  . Sexual Activity:    Partners: Female   Other Topics Concern  . Not on file   Social History Narrative      Pt lives at home with his spouse.   Caffeine Use: 2 cups daily.    REVIEW OF SYSTEMS: Constitutional: No fevers, chills, or sweats, no generalized fatigue, change in  appetite Eyes: No visual changes, double vision, eye pain Ear, nose and throat: No hearing loss, ear pain, nasal congestion, sore throat Cardiovascular: No chest pain, palpitations Respiratory:  No shortness of breath at rest or with exertion, wheezes GastrointestinaI: No nausea, vomiting, diarrhea, abdominal pain, fecal incontinence Genitourinary:  No dysuria, urinary retention or frequency Musculoskeletal:  No neck pain, back pain Integumentary: No rash, pruritus, skin lesions Neurological: as above Psychiatric: No depression, insomnia, anxiety Endocrine: No palpitations, fatigue, diaphoresis, mood swings, change in appetite, change in weight, increased thirst Hematologic/Lymphatic:  No anemia, purpura, petechiae. Allergic/Immunologic: no itchy/runny eyes, nasal congestion, recent allergic reactions, rashes  PHYSICAL EXAM: Filed Vitals:   02/07/16 0734  BP: 120/74  Pulse: 52   General: No acute distress.  Patient appears well-groomed.  normal body habitus. Head:  Normocephalic/atraumatic Eyes:  Fundoscopic exam unremarkable without vessel changes, exudates, hemorrhages or papilledema. Neck: supple, no paraspinal tenderness, full range of motion Heart:  Regular rate and rhythm Lungs:  Clear to auscultation bilaterally Back: No paraspinal tenderness Neurological Exam: alert and oriented to person, place, and time. Attention span and concentration intact, recent and remote memory intact, fund of knowledge intact.  Speech fluent and not dysarthric, language intact.  CN II-XII intact. Fundoscopic exam unremarkable without vessel changes, exudates, hemorrhages or papilledema.  Bulk and tone normal, muscle strength 5/5 throughout.  Sensation to light touch, temperature and vibration intact.  Deep tendon reflexes 2+ throughout, toes downgoing.  Finger to nose and heel to shin testing intact.  Gait with limp due to having hurt his knee.  IMPRESSION: Remote hemorrhagic stroke causing fourth  nerve palsy, likely secondary to hypertension Episodic tension type headache Memory deficits, stable/improved Back pain and numbness in legs, no source found but stable  PLAN: Increase nortriptyline to 63m at bedtime Optimize blood pressure control Follow up in 3 months.  Recheck MOCA/memory at that time.   AMetta Clines DO  CC:  WDonalee Citrin

## 2016-02-07 NOTE — Progress Notes (Signed)
Note routed

## 2016-02-07 NOTE — Patient Instructions (Signed)
1.  Increase nortriptyline 25mg  to 3 tablets at bedtime (75mg  total) 2.  Optimize blood pressure control 3.  Recheck memory next visit ([place]) 4.  Follow up in 3 months.

## 2016-02-08 ENCOUNTER — Telehealth: Payer: Self-pay | Admitting: Neurology

## 2016-02-08 NOTE — Telephone Encounter (Signed)
I don't have a problem with that.  If the buzzing is an issue, I recommend seeing ENT.

## 2016-02-08 NOTE — Telephone Encounter (Signed)
Pt is having ringing in the left ear also has a questions about medication [phone]

## 2016-02-08 NOTE — Telephone Encounter (Signed)
Patient made aware.

## 2016-02-08 NOTE — Telephone Encounter (Signed)
Pt needs to talk to someone about ringing in the ear please call [phone]

## 2016-02-08 NOTE — Telephone Encounter (Signed)
Patient states he forgot to mention at his appt yesterday that he has a buzzing in his left ear. He spoke with a physician friend of his and he advised him to take Vitamin B3 with Niacin to help with the buzzing in the ear. He wanted to get your opinion and see if this was safe to take. Please advise.

## 2016-02-08 NOTE — Telephone Encounter (Signed)
Discussed again with patient that Dr Tomi Likens would not have a recommendation for treatment of buzzing in the ears. He would recommend evaluation by ENT.

## 2016-02-09 LAB — CUP PACEART REMOTE DEVICE CHECK: Date Time Interrogation Session: 20170212153940

## 2016-02-09 NOTE — Progress Notes (Signed)
Carelink summary report received. Battery status OK. Normal device function. No new symptom episodes, tachy episodes, brady, or pause episodes. No new AF episodes. Monthly summary reports and ROV/PRN 

## 2016-02-10 LAB — CUP PACEART REMOTE DEVICE CHECK: MDC IDC SESS DTM: 20170114033827

## 2016-02-10 NOTE — Progress Notes (Signed)
Carelink summary report received. Battery status OK. Normal device function. No new symptom episodes, tachy episodes, or brady. No new AF episodes. 1 pause epiosode-3 seconds, appears to be undersensing of PVC's on ECG. Monthly summary reports and ROV/PRN

## 2016-02-11 ENCOUNTER — Telehealth: Payer: Self-pay | Admitting: Cardiology

## 2016-02-11 MED ORDER — AMLODIPINE BESYLATE 2.5 MG PO TABS: 2.5000 mg | ORAL_TABLET | Freq: Every day | ORAL | Status: DC

## 2016-02-11 NOTE — Telephone Encounter (Signed)
Rx sent for patient to start amlodipine 2.5mg  daily.  Please set him up for a BP clinic appt within the next 7-10 days.

## 2016-02-11 NOTE — Telephone Encounter (Signed)
New Message:  Pt called in stating that he has been having some nausea and his BP is elevated. He is concerned that this is caused by the Lisinopril he is taking and he would like to come in and get checked out. Please f/u with the pt

## 2016-02-11 NOTE — Telephone Encounter (Signed)
Patient st he does not think his increased dose of Lisinopril is working.  At 0730 today, his BP was 173/66 and he was nauseous.  Upon recheck an hour later, BP decreased to 148/? (he does not remember diastolic being abnormal but doesn't recall value). He reports he feels much better now, but needs BP under control as a small cranial bleed was found 2/28 (review 3/6 encounter for more details). Patient reports no other symptoms - no SOB, CP, swelling. He understands he will be called with recommendations.   To HTN Clinic and Dr. [name].

## 2016-02-11 NOTE — Telephone Encounter (Signed)
Called patient to give medication instructions. He reports his most recent BP was 98/60's. He states he checked his BP only a few minutes after taking his medications. Instructed patient to check BP 2 hours after checking BP over the weekend and to call Monday with results. Patient NOT to start amlodipine now. Will reevaluate Monday.

## 2016-02-12 NOTE — Telephone Encounter (Signed)
Agree. Quite labile BP Mirenda Baltazar, MD

## 2016-02-14 NOTE — Telephone Encounter (Signed)
Pt called to report his BP over the weekend.  He took his medication at 8am and then his BP at 10am.  Saturday was 0000000 systolic and Sunday [id number] systolic.  He feels more comfortable with these readings.  He will continue Lisinopril 40mg  daily for now and let us know how he is feeling at the end of the week.

## 2016-02-16 ENCOUNTER — Ambulatory Visit (INDEPENDENT_AMBULATORY_CARE_PROVIDER_SITE_OTHER): Payer: Medicare Other | Admitting: *Deleted

## 2016-02-16 DIAGNOSIS — R55 Syncope and collapse: Secondary | ICD-10-CM | POA: Diagnosis not present

## 2016-02-16 NOTE — Progress Notes (Signed)
Carelink Summary Report / Loop Recorder 

## 2016-02-21 ENCOUNTER — Telehealth: Payer: Self-pay | Admitting: Cardiology

## 2016-02-21 NOTE — Telephone Encounter (Signed)
New message     Talk to the pharmacist because his bp medication is not working

## 2016-02-22 MED ORDER — CLONIDINE HCL 0.1 MG PO TABS: 0.1000 mg | ORAL_TABLET | Freq: Every day | ORAL | Status: DC

## 2016-02-22 MED ORDER — LISINOPRIL 40 MG PO TABS
40.0000 mg | ORAL_TABLET | Freq: Every day | ORAL | Status: DC
Start: 2016-02-22 — End: 2016-10-11

## 2016-02-22 NOTE — Telephone Encounter (Signed)
Spoke with pt.  His BP has become very irregular and will range from 120s to 170s.  He is concerned over the high readings due to recent MRI results that showed remote hemorrhage.  He is following up with neurology for this.  He states he does not feel any different when his blood pressures are high or in range because he feels "lousy" all the time.    BP readings:  3/18- 156/83 3/19- 171/93 3/20- 124/86 3/21- 141/81  All of these were done at 10:00AM after he had taken his medication.  Discussed options.  He is only taking lisinopril 40mg  daily.  He did not pick up the amlodipine because BP dropped below 100 on just lisinopril.  He does not need any additional medication on a regular basis because most readings are < 150.  Suggested PRN clonidine for SBP>160.  Will send Rx to pharmacy.  Pt instructed to call if he has to use clonidine on most days of the week.

## 2016-02-23 ENCOUNTER — Telehealth: Payer: Self-pay | Admitting: Cardiology

## 2016-02-23 NOTE — Telephone Encounter (Signed)
Spoke with patient.  Clarified that he should only take clonidine when SBP>160

## 2016-02-23 NOTE — Telephone Encounter (Signed)
New message      Talk to Gay Filler about when to take his clonidine.

## 2016-02-29 ENCOUNTER — Ambulatory Visit: Payer: Medicare Other | Admitting: Pharmacist

## 2016-03-17 ENCOUNTER — Ambulatory Visit (INDEPENDENT_AMBULATORY_CARE_PROVIDER_SITE_OTHER): Payer: Medicare Other | Admitting: *Deleted

## 2016-03-17 DIAGNOSIS — R55 Syncope and collapse: Secondary | ICD-10-CM

## 2016-03-20 ENCOUNTER — Encounter: Payer: Self-pay | Admitting: Cardiology

## 2016-03-20 ENCOUNTER — Ambulatory Visit (INDEPENDENT_AMBULATORY_CARE_PROVIDER_SITE_OTHER): Payer: Medicare Other | Admitting: Cardiology

## 2016-03-20 VITALS — BP 126/78 | HR 66 | Ht 70.0 in | Wt 179.8 lb

## 2016-03-20 DIAGNOSIS — Z7901 Long term (current) use of anticoagulants: Secondary | ICD-10-CM

## 2016-03-20 DIAGNOSIS — I480 Paroxysmal atrial fibrillation: Secondary | ICD-10-CM | POA: Diagnosis not present

## 2016-03-20 DIAGNOSIS — I493 Ventricular premature depolarization: Secondary | ICD-10-CM | POA: Diagnosis not present

## 2016-03-20 DIAGNOSIS — R55 Syncope and collapse: Secondary | ICD-10-CM

## 2016-03-20 NOTE — Patient Instructions (Signed)
Medication Instructions:  The current medical regimen is effective;  continue present plan and medications.  Testing/Procedures: Your physician has requested that you have an echocardiogram. Echocardiography is a painless test that uses sound waves to create images of your heart. It provides your doctor with information about the size and shape of your heart and how well your heart's chambers and valves are working. This procedure takes approximately one hour. There are no restrictions for this procedure.  Follow-Up: Follow up in 6 months with Dr. Skains.  You will receive a letter in the mail 2 months before you are due.  Please call us when you receive this letter to schedule your follow up appointment.  If you need a refill on your cardiac medications before your next appointment, please call your pharmacy.  Thank you for choosing [facility]!!       

## 2016-03-20 NOTE — Progress Notes (Signed)
Carelink Summary Report / Loop Recorder 

## 2016-03-20 NOTE — Progress Notes (Signed)
[place]. [address], [address], [place]  [address] Phone: [phone] Fax:  [phone]  Date:  03/20/2016   ID:  Brian Hoover, DOB 1932-07-16, MRN [id number]  PCP:  [name], MD   History of Present Illness: Brian Hoover is a 82 y.o. male here for follow up paroxysmal atrial fibrillation, hypertension, fourth nerve palsy resolved, remote midbrain hemorrhage, disequilibrium, tinnitus, neurologist Dr. Tomi Likens.   Prior cardiac history includes frequent PVCs which were successfully ablated by Dr. [name] with resolution of symptoms. During those PVCs, he ended up having cardiomyopathy with decreased ejection fraction. At last check, his EF was 45% in 2013.   Recently he has been battling hypertension and most recent medication has been clonidine to be taken on as-needed basis with systolic blood pressure greater than 160. When he took clonidine once for instance, his blood pressure dropped to the 90s and he did not feel well. Appreciate pharmacy assistance with hypertension clinic.  Blood pressure had been fairly labile from 120s to 170s. He was concerned  because of MRI results that showed remote hemorrhage in midbrain which is likely secondary from hypertension. He had fourth nerve palsy which was detected by his ophthalmologist and symptoms were double vision. This occurred in December 2016. Note he has not been on anticoagulation for his atrial fibrillation at his own volition.   Previously, was walking 3 miles into walk. Was dizzy, felt completely off-balance, did not lose consciousness. Collapsed, fell tore forehead. He tried to get up but ended up leaning on trash cans. Very concerning to him. He did not feel palpitations previously. He notes that he woke up a few months ago and he noticed that the room was spinning. He has had this off and on for several months. He notes that if he bends down and then gets up quickly, he will have some dizziness.  Event monitor placed a  November 2013 did demonstrate short burst of nonsustained ventricular tachycardia. Because of this, he ended up seeing Dr. Crissie Sickles once again. Since he did not have any recurrence of episodes at that time, Dr. [name] endorsed implantable loop recorder. On interrogation showed no sustained arrhythmias or bradycardic episodes, however atrial fibrillation was noted.  He has also seen neurology regarding this matter. Unfortunately, he sustained  fall previously at [facility] fractured ribs, pneumothorax. He slipped on wet shower floor. Clearly no syncope.      Wt Readings from Last 3 Encounters:  03/20/16 179 lb 12.8 oz (81.557 kg)  02/07/16 174 lb (78.926 kg)  12/13/15 173 lb (78.472 kg)     Past Medical History  Diagnosis Date  . Chronic systolic heart failure ([place])   . PVC (premature ventricular contraction)   . Bradycardia   . COPD (chronic obstructive pulmonary disease) ([place])   . Dyspnea   . Pneumothorax 01/28/2013  . CHF (congestive heart failure) ([place])   . GERD (gastroesophageal reflux disease)   . Migraine   . Syncope     s/p Medtronic ILR implant 05/2013 by Dr [name]  . MGUS (monoclonal gammopathy of unknown significance) 01/25/2015    Past Surgical History  Procedure Laterality Date  . Tendon repair      right foot  . Shoulder arthroscopy    . Prostate surgery    . Clavicle surgery    . Chest tube insertion Right 01/29/2013  . Loop recorder implant  06/02/2013    Medtronic LinQ implanted for syncope by Dr [name]  .  Loop recorder implant N/A 06/02/2013    Procedure: LOOP RECORDER IMPLANT;  Surgeon: [name], MD;  Location: [facility] CATH LAB;  Service: Cardiovascular;  Laterality: N/A;    Current Outpatient Prescriptions  Medication Sig Dispense Refill  . aspirin 325 MG tablet Take 325 mg by mouth as needed for headache.    Marland Kitchen aspirin EC 81 MG tablet Take 81 mg by mouth daily.    . cholecalciferol (VITAMIN D) 1000 UNITS tablet Take 1,000 Units by mouth daily.    . cloNIDine  (CATAPRES) 0.1 MG tablet Take 1 tablet (0.1 mg total) by mouth daily. As needed for systolic blood pressure > 160 mmHg 30 tablet 2  . lisinopril (PRINIVIL,ZESTRIL) 40 MG tablet Take 1 tablet (40 mg total) by mouth daily. 30 tablet 6  . Multiple Vitamins-Minerals (MULTIVITAMIN WITH MINERALS) tablet Take 1 tablet by mouth daily.    . nortriptyline (PAMELOR) 25 MG capsule Take 3 capsules (75 mg total) by mouth at bedtime. 90 capsule 3  . OLANZapine (ZYPREXA) 10 MG tablet Take 10 mg by mouth at bedtime as needed (as needed to ward off  cluster headaches.).     Marland Kitchen sildenafil (VIAGRA) 100 MG tablet Take 100 mg by mouth daily as needed for erectile dysfunction.    . valACYclovir (VALTREX) 500 MG tablet Take 500 mg by mouth daily.     No current facility-administered medications for this visit.    Allergies:    Allergies  Allergen Reactions  . Sulfa Antibiotics Hives    Hives     Social History:  The patient  reports that he quit smoking about 55 years ago. His smoking use included Cigarettes. He has a 15 pack-year smoking history. He has never used smokeless tobacco. He reports that he drinks about 6.0 oz of alcohol per week. He reports that he does not use illicit drugs.   ROS:  Please see the history of present illness.   Disequilibrium. No chest pain, no shortness of breath, no further syncopal episodes    PHYSICAL EXAM: VS:  BP 126/78 mmHg  Pulse 66  Ht 5\' 10"  (1.778 m)  Wt 179 lb 12.8 oz (81.557 kg)  BMI 25.80 kg/m2 Well nourished, well developed, in no acute distress HEENT: normal Neck: no JVD Cardiac:  normal S1, S2; RRR; no murmur, no ectopic beat Lungs:  clear to auscultation bilaterally, no wheezing, rhonchi or rales Abd: soft, nontender, no hepatomegaly Ext: no edema Skin: warm and dry Neuro: no focal abnormalities noted  EKG:  None today. Previous showed PVCs.   Echocardiogram 11/13-EF 45%, trace AI  ASSESSMENT AND PLAN:  1. Paroxysmal atrial fibrillation-risks and  benefits discussed. Age 89, and decreased ejection fraction CHADSVASc score of at least 3, perhaps 5 if we are including small remote hemorrhage of mid brain resulting in fourth nerve palsy. I've asked him to discuss restarting Xarelto or Eliquis with Dr. Tomi Likens his neurologist who he will be seeing soon. They are coarse concerned about the possibility worsening hemorrhage. In the past, prior to his fourth nerve palsy, we started Xarelto for him however he did up stopping this because of increased bleeding. He was noticing clotted blood in his mouth repeatedly, sometimes darker stools. After stopping the Xarelto all of this has subsided and is relieved. We had a lengthy discussion about the importance of anticoagulation and reduction of stroke. Encouraged him to seek the care of dentist, he has not seen in a while, to make sure that he does not have  any gum disease that would lead to bleeding. Explained to him that often we are able to control the minor bleeding in order to help prevent a major stroke.  He is currently taking low-dose aspirin. I'm going to repeat his echocardiogram. 2. Disequilibrium- neurology has evaluated. MRA/MRI of brain have been reassuring in the past however he has most recently had an MRI on 02/01/16 which showed small area of remote hemorrhage in midbrain responsible for fourth nerve palsy, double vision. He has tried physical therapy. No recent medication initiations. No tremors. 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 He has been able to however go on a recent cruise to the [place], sailing vessel, star riggers. He also had ringing and is here, left side and he wonders if there is a correlation. Could this be Mnire's? 3. Syncope-no further episodes. Implantable loop recorder has been recently interrogated and showed only atrial fibrillation brief episodes multiple, 10 minutes duration for instance in October and November 2016, no significant  bradycardia Dr. Tanna Furry note reviewed. 4. PVCs-no ectopy heard on exam. He is status post radiofrequency ablation PVCs by Dr. [name].  5. Hypertension-currently improved controlled. Appreciate [place] in the pharmacy clinic helping to titrate lisinopril. In the distant past he remembers having a "scratchy throat "with lisinopril perhaps. Currently he is tolerating this well. Continue. He took 1 dose of clonidine however this dropped his blood pressure to the 90s and he did not feel well. He will be hesitant to use again. His blood pressure log is overall fairly reassuring with blood pressures ranging from [id number] today systolic to [id number] systolic. 6. Chronic systolic heart failure-very well compensated, no shortness of breath. NYHA class I. Recent ejection fraction 45%. ACE inhibitor. No further beta blocker due to bradycardia. 7. 32-month follow up   Signed, Candee Furbish, MD [facility]  03/20/2016 4:19 PM

## 2016-03-30 ENCOUNTER — Other Ambulatory Visit: Payer: Self-pay

## 2016-03-30 ENCOUNTER — Ambulatory Visit (HOSPITAL_COMMUNITY): Payer: Medicare Other | Attending: Cardiology

## 2016-03-30 DIAGNOSIS — I509 Heart failure, unspecified: Secondary | ICD-10-CM | POA: Insufficient documentation

## 2016-03-30 DIAGNOSIS — I480 Paroxysmal atrial fibrillation: Secondary | ICD-10-CM

## 2016-03-30 DIAGNOSIS — R29898 Other symptoms and signs involving the musculoskeletal system: Secondary | ICD-10-CM | POA: Diagnosis not present

## 2016-03-30 DIAGNOSIS — I110 Hypertensive heart disease with heart failure: Secondary | ICD-10-CM | POA: Insufficient documentation

## 2016-03-30 DIAGNOSIS — I371 Nonrheumatic pulmonary valve insufficiency: Secondary | ICD-10-CM | POA: Diagnosis not present

## 2016-03-30 DIAGNOSIS — I351 Nonrheumatic aortic (valve) insufficiency: Secondary | ICD-10-CM | POA: Insufficient documentation

## 2016-03-30 DIAGNOSIS — I4891 Unspecified atrial fibrillation: Secondary | ICD-10-CM | POA: Diagnosis present

## 2016-04-10 ENCOUNTER — Telehealth: Payer: Self-pay | Admitting: Obstetrics & Gynecology

## 2016-04-10 NOTE — Telephone Encounter (Signed)
pt called to cx appt....will call back to r/s °

## 2016-04-14 ENCOUNTER — Ambulatory Visit: Payer: Medicare Other | Admitting: Hematology and Oncology

## 2016-04-14 ENCOUNTER — Other Ambulatory Visit: Payer: Medicare Other

## 2016-04-17 ENCOUNTER — Ambulatory Visit (INDEPENDENT_AMBULATORY_CARE_PROVIDER_SITE_OTHER): Payer: Medicare Other | Admitting: *Deleted

## 2016-04-17 DIAGNOSIS — R55 Syncope and collapse: Secondary | ICD-10-CM | POA: Diagnosis not present

## 2016-04-17 NOTE — Progress Notes (Signed)
Carelink Summary Report / Loop Recorder 

## 2016-04-28 LAB — CUP PACEART REMOTE DEVICE CHECK: MDC IDC SESS DTM: 20170315043635

## 2016-04-30 LAB — CUP PACEART REMOTE DEVICE CHECK: Date Time Interrogation Session: 20170414043623

## 2016-04-30 NOTE — Progress Notes (Signed)
Carelink summary report received. Battery status OK. Normal device function. No new symptom episodes, tachy episodes, brady, or pause episodes. No new AF episodes. Monthly summary reports and ROV/PRN 

## 2016-05-09 ENCOUNTER — Other Ambulatory Visit: Payer: Self-pay | Admitting: Cardiology

## 2016-05-16 ENCOUNTER — Ambulatory Visit (INDEPENDENT_AMBULATORY_CARE_PROVIDER_SITE_OTHER): Payer: Medicare Other | Admitting: *Deleted

## 2016-05-16 DIAGNOSIS — R55 Syncope and collapse: Secondary | ICD-10-CM

## 2016-05-16 NOTE — Progress Notes (Signed)
Carelink Summary Report / Loop Recorder 

## 2016-05-22 ENCOUNTER — Ambulatory Visit (INDEPENDENT_AMBULATORY_CARE_PROVIDER_SITE_OTHER): Payer: Medicare Other | Admitting: Neurology

## 2016-05-22 ENCOUNTER — Encounter: Payer: Self-pay | Admitting: Neurology

## 2016-05-22 VITALS — BP 124/72 | HR 82 | Ht 70.0 in | Wt 178.0 lb

## 2016-05-22 DIAGNOSIS — R2681 Unsteadiness on feet: Secondary | ICD-10-CM

## 2016-05-22 DIAGNOSIS — H4910 Fourth [trochlear] nerve palsy, unspecified eye: Secondary | ICD-10-CM

## 2016-05-22 DIAGNOSIS — G3184 Mild cognitive impairment, so stated: Secondary | ICD-10-CM

## 2016-05-22 DIAGNOSIS — H9312 Tinnitus, left ear: Secondary | ICD-10-CM

## 2016-05-22 DIAGNOSIS — I619 Nontraumatic intracerebral hemorrhage, unspecified: Secondary | ICD-10-CM | POA: Diagnosis not present

## 2016-05-22 DIAGNOSIS — G44219 Episodic tension-type headache, not intractable: Secondary | ICD-10-CM

## 2016-05-22 MED ORDER — DONEPEZIL HCL 5 MG PO TABS: 5.0000 mg | ORAL_TABLET | Freq: Every day | ORAL | Status: DC

## 2016-05-22 NOTE — Progress Notes (Signed)
NEUROLOGY FOLLOW UP OFFICE NOTE  Brian Hoover [id number]  HISTORY OF PRESENT ILLNESS: Brian Hoover is an 82 year old right-handed man with cluster headaches, COPD, HSV II, CHF, GERD, chronic systolic heart failure, PVCs, and history of pheumothorax follows up for hemorrhagic brainstem infarct, headache, gait instaibility.    HEMORRHAGIC STROKE/CRANIAL FOURTH NERVE PALSY: Update: Vision has improved, although he feels it has gradually gotten worse, although nothing like how it was several months ago.  He needs lenses and has to follow up with Dr. Valetta Close.  He still reports tinnitus in the left ear.  He saw ENT and nothing to do.   History: He developed vertical diplopia several months ago, particularly noticeable when he was driving.  It has definitely improved and now only notices it when he has his head tilted back and is looking down.  He saw ophthalmologist, Dr. Jola Schmidt, who diagnosed a fourth nerve palsy.  MRI of the brain and orbits with and without contrast from 02/01/16 showed a focal area of remote hemorrhage just inferior to the third nerve nucleus and near the fourth nerve nucleus.  He reportedly had fluctuations in blood pressure with systolics in the high 347Q.  HEADACHE: Update: Headaches are controlled on nortriptyline 25m at bedtime.  History; He has history of cluster headaches for over 20 years as well as other headaches for many years.  [facility]  His cluster headaches are described as involving the right eye, 10/10 intensity, sharp stabbing pain, lasting one hour.  It would occur between 1:30 and 2 am in the morning.  He was previously treated with verapamil, which caused constipation.  He had used O2 and nasal sprays.  For many years, he has been taking Zyprexa.  He takes it as needed, only when he feels he has a cluster headache.  His last cluster headache was in 2012.  He also has other type of headache.  It is  bi-frontal, non-throbbing ache, about 6-7/10.  It is not associated with other symptoms such as nausea or photophobia.  It can last all day and typically occurs 4 times a month.  Often, he wakes up with it.  Sometimes, it would lead into a cluster, so he has taken Zyprexa recently for it, but it has not helped.  He was advised to start taking the Zyprexa daily.  He takes ASA or ibuprofen sparingly.  They only take the edge off.  Drinking two cups of coffee treats it well.  He drinks coffee daily.    DIZZINESS/PRESYNCOPE: He has had episodes of feeling "woozy" for 2-3 years.  One time it occurred after a 3 mile walk and another time it occurred when he stood up while walking in his garden.  He has not lost consciousness.  There is no spinning sensation but he says that the "room swims".  MRI of the brain and MRA of head from 11/05/12 were unremarkable.  MRA of neck showed no stenosis except for some smooth plaque at the origin of the right ICA.  Orthostatic testing was reportedly negative.  He also reports that he sometimes feels very cold in warm weather.  GAIT INSTABILITY Update: He fell 3 times at choir practice, which was about 4 or 5 months ago.  History: He was also evaluated for balance issues.  This has been ongoing for 2-3 years.  He denies numbness in the feet or weakness in the legs.  He tore his right achilles heel  at that time and had right foot weakness, which improved.  NCV-EMG from 03/17/13 showed evidence of active and chronic right L5 radiculopathy but no evidence of polyneuropathy.  MRI of lumbar spine from 03/20/13 showed multi-level disc bulging and facet hypertrophy but no spinal stenosis or significant foraminal narrowing or cause for L5 radiculopathy.  Neuropathy labs were performed on 01/13/14, revealing small (0.3) M-spike. ANA, Sed Rate, RF, TSH, Hgb A1c, vitamin D 25-hydroxy, and B12 were unremarkable.   Repeat testing of SPEP/IFE from December again showed an M-Spike with abnormal  IgA lambda, consistent with MGUS.  He was referred to Dr. Alvy Bimler of Heme/Onc.  It was recommended to monitor for now. He continues to have non-radiating back pain with numbness in the legs with prolonged standing, unchanged.  A NCV-EMG performed on 01/14/15 showed chronic right L3-L5 radiculopathy but no evidence of generalized sensorimotor polyneuropathy.  To further evaluate gait instability, an MRI of the cervical spine was performed on 01/27/15, which did not reveal spinal stenosis.  MEMORY PROBLEMS: Update: Stable.  He is able to drive without difficulty.  He pays his bills without any problems.  Mood is good.  History: He has noticed some memory difficulties for about 2 years.  Specifically, he notes some problems recalling names of casual acquaintances (people he has only met maybe a couple of times).  Sometimes, when he is driving on familiar routes to places he frequents, he needs to take a moment to re-orient himself.  This occurs with places such as restaurants, but not places he goes to often, such as the grocery store.  He denies problems with word-finding or remembering to take medication.  He manages his finances without difficulty.  He does not repeat questions.  He does not exhibit REM sleep behavior disorder.  He has not had any personality or behavioral changes.  He denies hallucinations.  There is no known family history of dementia or cognitive impairment.  He has a Brewing technologist.  MOCA from 04/26/15 was 24/30.  PAST MEDICAL HISTORY: Past Medical History  Diagnosis Date  . Chronic systolic heart failure ([place])   . PVC (premature ventricular contraction)   . Bradycardia   . COPD (chronic obstructive pulmonary disease) ([place])   . Dyspnea   . Pneumothorax 01/28/2013  . CHF (congestive heart failure) ([place])   . GERD (gastroesophageal reflux disease)   . Migraine   . Syncope     s/p Medtronic ILR implant 05/2013 by Dr [name]  . MGUS (monoclonal gammopathy of unknown significance)  01/25/2015    MEDICATIONS: Current Outpatient Prescriptions on File Prior to Visit  Medication Sig Dispense Refill  . aspirin 325 MG tablet Take 325 mg by mouth as needed for headache.    Marland Kitchen aspirin EC 81 MG tablet Take 81 mg by mouth daily.    . cholecalciferol (VITAMIN D) 1000 UNITS tablet Take 1,000 Units by mouth daily.    . cloNIDine (CATAPRES) 0.1 MG tablet Take 1 tablet (0.1 mg total) by mouth daily. As needed for systolic blood pressure > 160 mmHg 30 tablet 2  . lisinopril (PRINIVIL,ZESTRIL) 40 MG tablet Take 1 tablet (40 mg total) by mouth daily. 30 tablet 6  . Multiple Vitamins-Minerals (MULTIVITAMIN WITH MINERALS) tablet Take 1 tablet by mouth daily.    . nortriptyline (PAMELOR) 25 MG capsule Take 3 capsules (75 mg total) by mouth at bedtime. 90 capsule 3  . OLANZapine (ZYPREXA) 10 MG tablet Take 10 mg by mouth at bedtime as needed (as needed to  ward off  cluster headaches.).     Marland Kitchen sildenafil (VIAGRA) 100 MG tablet Take 100 mg by mouth daily as needed for erectile dysfunction.    . valACYclovir (VALTREX) 500 MG tablet Take 500 mg by mouth daily.     No current facility-administered medications on file prior to visit.    ALLERGIES: Allergies  Allergen Reactions  . Sulfa Antibiotics Hives    Hives     FAMILY HISTORY: Family History  Problem Relation Age of Onset  . Breast cancer Mother   . Uterine cancer Mother   . Rheum arthritis Son     SOCIAL HISTORY: Social History   Social History  . Marital Status: Married    Spouse Name: [name]  . Number of Children: 2  . Years of Education: MA   Occupational History  . retired Chief Financial Officer    Social History Main Topics  . Smoking status: Former Smoker -- 1.50 packs/day for 10 years    Types: Cigarettes    Quit date: 12/04/1960  . Smokeless tobacco: Never Used  . Alcohol Use: 6.0 oz/week    10 Shots of liquor per week     Comment: 4 Drinks a week  . Drug Use: No  . Sexual Activity:    Partners: Female   Other  Topics Concern  . Not on file   Social History Narrative      Pt lives at home with his spouse.   Caffeine Use: 2 cups daily.    REVIEW OF SYSTEMS: Constitutional: No fevers, chills, or sweats, no generalized fatigue, change in appetite Eyes: No visual changes, double vision, eye pain Ear, nose and throat: No hearing loss, ear pain, nasal congestion, sore throat Cardiovascular: No chest pain, palpitations Respiratory:  No shortness of breath at rest or with exertion, wheezes GastrointestinaI: No nausea, vomiting, diarrhea, abdominal pain, fecal incontinence Genitourinary:  No dysuria, urinary retention or frequency Musculoskeletal:  No neck pain, back pain Integumentary: No rash, pruritus, skin lesions Neurological: as above Psychiatric: No depression, insomnia, anxiety Endocrine: No palpitations, fatigue, diaphoresis, mood swings, change in appetite, change in weight, increased thirst Hematologic/Lymphatic:  No purpura, petechiae. Allergic/Immunologic: no itchy/runny eyes, nasal congestion, recent allergic reactions, rashes  PHYSICAL EXAM: Filed Vitals:   05/22/16 1012  BP: 124/72  Pulse: 82   General: No acute distress.  Patient appears well-groomed.  normal body habitus. Head:  Normocephalic/atraumatic Eyes:  Fundi examined but not visualized Neck: supple, no paraspinal tenderness, full range of motion Heart:  Regular rate and rhythm Lungs:  Clear to auscultation bilaterally Back: No paraspinal tenderness Neurological Exam: alert and oriented to person, place, and year (not month or date, which he attributes to being retired and not paying attention). Attention span and concentration intact, delayed recall poor remote memory intact, fund of knowledge intact.  Speech fluent and not dysarthric, language intact.   Montreal Cognitive Assessment  05/22/2016 04/26/2015  Visuospatial/ Executive (0/5) 2 3  Naming (0/3) 3 3  Attention: Read list of digits (0/2) 2 1  Attention: Read  list of letters (0/1) 1 1  Attention: Serial 7 subtraction starting at 100 (0/3) 3 3  Language: Repeat phrase (0/2) 2 2  Language : Fluency (0/1) 1 0  Abstraction (0/2) 2 2  Delayed Recall (0/5) 0 3  Orientation (0/6) 4 6  Total 20 24  Adjusted Score (based on education) 20 24   CN II-XII intact. Bulk and tone normal, muscle strength 5/5 throughout.  Sensation to pinprick intact.  Decreased  vibration sensation in right foot.  Deep tendon reflexes 2+ throughout, toes downgoing.  Finger to nose and heel to shin testing intact.  Gait wide-based, Romberg negative.  IMPRESSION: Remote hemorrhagic stroke causing fourth nerve palsy, likely secondary to hypertension Episodic tension type headache MCI Tinnitus and hearing loss Gait instability, unclear etiology  He is currently on ASA.  Cardiology is questioning restarting anticoagulation.  If cardiology prefers switching from ASA back to an anticoagulant, I think it would be okay to do so, if cardiology prefers.  The bleed is old.  There is risk of bleed with restarting anticoagulation, to be sure.  However, I think the risk of not restarting anticoagulation outweighs the risk of restarting it.  PLAN: 1.  Will start Aricept 51m at bedtime for 4 weeks and then increase to 165mat bedtime if tolerating. 2.  Nortriptyline 7568mt bedtime 3.  Follow up with Dr. BowValetta Close  Follow up in 6 months.  26 minutes spent face to face with patient, over 50% spent discussing diagnosis and management.  AdaMetta ClinesO  CC:  WenCari CarawayD  MarCandee FurbishD

## 2016-05-22 NOTE — Progress Notes (Signed)
Chart forwarded.  

## 2016-05-22 NOTE — Patient Instructions (Addendum)
1.  For memory, we will start donepezil (Aricept) 5mg  daily for four weeks.  If you are tolerating the medication, then after four weeks, we will increase the dose to 10mg  daily.  Side effects include nausea, vomiting, diarrhea, vivid dreams, and muscle cramps.  Please call the clinic if you experience any of these symptoms. 2.  Continue nortriptyline 75mg  at bedtime 3.  Follow up with Dr. Valetta Close 4.  Follow up in 6 months.

## 2016-05-23 LAB — CUP PACEART REMOTE DEVICE CHECK: MDC IDC SESS DTM: 20170514043724

## 2016-05-31 ENCOUNTER — Ambulatory Visit: Payer: Medicare Other | Admitting: Neurology

## 2016-06-11 ENCOUNTER — Other Ambulatory Visit: Payer: Self-pay | Admitting: Neurology

## 2016-06-12 NOTE — Telephone Encounter (Signed)
05/22/16 11/21/16  2. Nortriptyline 75mg  at bedtime

## 2016-06-14 LAB — CUP PACEART REMOTE DEVICE CHECK: MDC IDC SESS DTM: 20170613050535

## 2016-06-15 ENCOUNTER — Ambulatory Visit (INDEPENDENT_AMBULATORY_CARE_PROVIDER_SITE_OTHER): Payer: Medicare Other | Admitting: *Deleted

## 2016-06-15 DIAGNOSIS — R55 Syncope and collapse: Secondary | ICD-10-CM

## 2016-06-15 NOTE — Progress Notes (Signed)
Carelink Summary Report / Loop Recorder 

## 2016-06-22 ENCOUNTER — Telehealth: Payer: Self-pay | Admitting: Neurology

## 2016-06-22 MED ORDER — DONEPEZIL HCL 10 MG PO TABS: 10.0000 mg | ORAL_TABLET | Freq: Every day | ORAL | Status: DC

## 2016-06-22 NOTE — Telephone Encounter (Signed)
Brian Hoover [birth year]. His # [phone]. He is calling on his medication Donepezil. May need to be increased and refill. He uses [facility]. Thank you.

## 2016-06-22 NOTE — Telephone Encounter (Signed)
Spoke with patient. He is doing well on Aricept. RX sent for 10 mg. He will call with any problems.

## 2016-06-30 ENCOUNTER — Telehealth: Payer: Self-pay | Admitting: Cardiology

## 2016-06-30 NOTE — Telephone Encounter (Signed)
Spoke with pt who is reporting that 2 to 3 times a week his BP is going up to [id number] systolic.  He has been taking Lisinopril 40 mg and is concerned as to whether or not this needs to be changed to a different medication.  Advised Dr [name] reviewed and gave orders to add Amlodipine 5 mg a day and to continue to monitor.  Pt would like to continue to monitor and take Clonidine prn as he has been.  He will c/b if he decides to take the Amlodipine 5 mg.

## 2016-06-30 NOTE — Telephone Encounter (Signed)
Pt c/o BP issue: STAT if pt c/o blurred vision, one-sided weakness or slurred speech  1. What are your last 5 BP readings? Systolic has been in [id number] to 180s per pt wife   2. Are you having any other symptoms (ex. Dizziness, headache, blurred vision, passed out)? Headaches   3. What is your BP issue? BP has been high

## 2016-07-17 ENCOUNTER — Ambulatory Visit (INDEPENDENT_AMBULATORY_CARE_PROVIDER_SITE_OTHER): Payer: Medicare Other | Admitting: *Deleted

## 2016-07-17 DIAGNOSIS — R55 Syncope and collapse: Secondary | ICD-10-CM

## 2016-07-17 NOTE — Progress Notes (Signed)
Carelink Summary Report / Loop Recorder 

## 2016-07-19 ENCOUNTER — Encounter: Payer: Self-pay | Admitting: Neurology

## 2016-07-19 ENCOUNTER — Ambulatory Visit (INDEPENDENT_AMBULATORY_CARE_PROVIDER_SITE_OTHER): Payer: Medicare Other | Admitting: Neurology

## 2016-07-19 VITALS — BP 120/80 | HR 72 | Ht 70.0 in | Wt 175.0 lb

## 2016-07-19 DIAGNOSIS — R42 Dizziness and giddiness: Secondary | ICD-10-CM | POA: Diagnosis not present

## 2016-07-19 DIAGNOSIS — G44219 Episodic tension-type headache, not intractable: Secondary | ICD-10-CM | POA: Diagnosis not present

## 2016-07-19 NOTE — Progress Notes (Signed)
Chart forwarded.  

## 2016-07-19 NOTE — Patient Instructions (Signed)
1.  Stop the donepezil and stop caffeine and aspirin (or at least limit use to no more than 2 days out of the week) 2.  Contact me in a couple of weeks with update.

## 2016-07-19 NOTE — [place] Notes (Signed)
NEUROLOGY FOLLOW UP OFFICE NOTE  Brian Hoover [id number]  HISTORY OF PRESENT ILLNESS: .Brian Hoover is an 82 year old right-handed man with cluster headaches, COPD, HSV II, CHF, GERD, chronic systolic heart failure, PVCs, and history of pheumothorax follows up for headache and dizziness.    UPDATE: About 3 weeks ago, we increased donepezil from 62m to 165mdaily.  He still has tinnitus in the left ear.  He was given a device to suppress the sound, but it is still an issue.  Over the past 3 weeks, he began to feel "a little seasick".  He feels slightly nauseous.  About 10 days ago, he started have increased frequency of tension headaches, although more intense (sometimes 9/10).  It has occurred about 4 days out of the last 10 days.  He has been treating it with ASA and coffee.  He denies diplopia.   HEMORRHAGIC STROKE/CRANIAL FOURTH NERVE PALSY: Update: Stable.   History: He developed vertical diplopia several months ago, particularly noticeable when he was driving.  It has definitely improved and now only notices it when he has his head tilted back and is looking down.  He saw ophthalmologist, Dr. BrJola Schmidtwho diagnosed a fourth nerve palsy.  MRI of the brain and orbits with and without contrast from 02/01/16 showed a focal area of remote hemorrhage just inferior to the third nerve nucleus and near the fourth nerve nucleus.  He reportedly had fluctuations in blood pressure with systolics in the high 16702O  HEADACHE: Update: He is taking nortriptyline 7551mt bedtime.   History; He has history of cluster headaches for over 20 years as well as other headaches for many years.  He was previously treated at the [facility] His cluster headaches are described as involving the right eye, 10/10 intensity, sharp stabbing pain, lasting one hour.  It would occur between 1:30 and 2 am in the morning.  He was previously treated with verapamil, which caused constipation.  He had  used O2 and nasal sprays.  For many years, he has been taking Zyprexa.  He takes it as needed, only when he feels he has a cluster headache.  His last cluster headache was in 2012.   He also has other type of headache.  It is bi-frontal, non-throbbing ache, about 6-7/10.  It is not associated with other symptoms such as nausea or photophobia.  It can last all day and typically occurs 4 times a month.  Often, he wakes up with it.  Sometimes, it would lead into a cluster, so he has taken Zyprexa recently for it, but it has not helped.  He was advised to start taking the Zyprexa daily.  He takes ASA or ibuprofen sparingly.  They only take the edge off.  Drinking two cups of coffee treats it well.  He drinks coffee daily.     DIZZINESS/PRESYNCOPE: He has had episodes of feeling "woozy" for 2-3 years.  One time it occurred after a 3 mile walk and another time it occurred when he stood up while walking in his garden.  He has not lost consciousness.  There is no spinning sensation but he says that the "room swims".  MRI of the brain and MRA of head from 11/05/12 were unremarkable.  MRA of neck showed no stenosis except for some smooth plaque at the origin of the right ICA.  Orthostatic testing was reportedly negative.  He also reports that he sometimes feels very cold in warm  weather.   GAIT INSTABILITY Update: He fell 3 times at choir practice, which was about 4 or 5 months ago.   History: He was also evaluated for balance issues.  This has been ongoing for 2-3 years.  He denies numbness in the feet or weakness in the legs.  He tore his right achilles heel at that time and had right foot weakness, which improved.  NCV-EMG from 03/17/13 showed evidence of active and chronic right L5 radiculopathy but no evidence of polyneuropathy.  MRI of lumbar spine from 03/20/13 showed multi-level disc bulging and facet hypertrophy but no spinal stenosis or significant foraminal narrowing or cause for L5 radiculopathy.   Neuropathy labs were performed on 01/13/14, revealing small (0.3) M-spike. ANA, Sed Rate, RF, TSH, Hgb A1c, vitamin D 25-hydroxy, and B12 were unremarkable.   Repeat testing of SPEP/IFE from December again showed an M-Spike with abnormal IgA lambda, consistent with MGUS.  He was referred to Dr. Alvy Bimler of Heme/Onc.  It was recommended to monitor for now. He continues to have non-radiating back pain with numbness in the legs with prolonged standing, unchanged.   A NCV-EMG performed on 01/14/15 showed chronic right L3-L5 radiculopathy but no evidence of generalized sensorimotor polyneuropathy.  To further evaluate gait instability, an MRI of the cervical spine was performed on 01/27/15, which did not reveal spinal stenosis.   MEMORY PROBLEMS: Update: Stable.  He is able to drive without difficulty.  He pays his bills without any problems.  Mood is good.   History: He has noticed some memory difficulties for about 2 years.  Specifically, he notes some problems recalling names of casual acquaintances (people he has only met maybe a couple of times).  Sometimes, when he is driving on familiar routes to places he frequents, he needs to take a moment to re-orient himself.  This occurs with places such as restaurants, but not places he goes to often, such as the grocery store.  He denies problems with word-finding or remembering to take medication.  He manages his finances without difficulty.  He does not repeat questions.  He does not exhibit REM sleep behavior disorder.  He has not had any personality or behavioral changes.  He denies hallucinations.  There is no known family history of dementia or cognitive impairment.  He has a Brewing technologist.   PAST MEDICAL HISTORY: Past Medical History:  Diagnosis Date  . Bradycardia   . CHF (congestive heart failure) ([place])   . Chronic systolic heart failure ([place])   . COPD (chronic obstructive pulmonary disease) ([place])   . Dyspnea   . GERD (gastroesophageal reflux disease)     . MGUS (monoclonal gammopathy of unknown significance) 01/25/2015  . Migraine   . Pneumothorax 01/28/2013  . PVC (premature ventricular contraction)   . Syncope    s/p Medtronic ILR implant 05/2013 by Dr [name]    MEDICATIONS: Current Outpatient Prescriptions on File Prior to Visit  Medication Sig Dispense Refill  . aspirin 325 MG tablet Take 325 mg by mouth as needed for headache.    Marland Kitchen aspirin EC 81 MG tablet Take 81 mg by mouth daily.    . cholecalciferol (VITAMIN D) 1000 UNITS tablet Take 1,000 Units by mouth daily.    . cloNIDine (CATAPRES) 0.1 MG tablet Take 1 tablet (0.1 mg total) by mouth daily. As needed for systolic blood pressure > 160 mmHg 30 tablet 2  . donepezil (ARICEPT) 10 MG tablet Take 1 tablet (10 mg total) by mouth at bedtime. [place]  tablet 1  . lisinopril (PRINIVIL,ZESTRIL) 40 MG tablet Take 1 tablet (40 mg total) by mouth daily. 30 tablet 6  . Multiple Vitamins-Minerals (MULTIVITAMIN WITH MINERALS) tablet Take 1 tablet by mouth daily.    . nortriptyline (PAMELOR) 25 MG capsule TAKE 3 CAPSULES BY MOUTH AT BEDTIME 90 capsule 4  . OLANZapine (ZYPREXA) 10 MG tablet Take 10 mg by mouth at bedtime as needed (as needed to ward off  cluster headaches.).     Marland Kitchen sildenafil (VIAGRA) 100 MG tablet Take 100 mg by mouth daily as needed for erectile dysfunction.    . valACYclovir (VALTREX) 500 MG tablet Take 500 mg by mouth daily.     No current facility-administered medications on file prior to visit.     ALLERGIES: Allergies  Allergen Reactions  . Sulfa Antibiotics Hives    Hives     FAMILY HISTORY: Family History  Problem Relation Age of Onset  . Breast cancer Mother   . Uterine cancer Mother   . Rheum arthritis Son     SOCIAL HISTORY: Social History   Social History  . Marital status: Married    Spouse name: [name]  . Number of children: 2  . Years of education: MA   Occupational History  . retired Chief Financial Officer    Social History Main Topics  . Smoking status:  Former Smoker    Packs/day: 1.50    Years: 10.00    Types: Cigarettes    Quit date: 12/04/1960  . Smokeless tobacco: Never Used  . Alcohol use 6.0 oz/week    10 Shots of liquor per week     Comment: 4 Drinks a week  . Drug use: No  . Sexual activity: Yes    Partners: Female   Other Topics Concern  . Not on file   Social History Narrative      Pt lives at home with his spouse.   Caffeine Use: 2 cups daily.    REVIEW OF SYSTEMS: Constitutional: No fevers, chills, or sweats, no generalized fatigue, change in appetite Eyes: No visual changes, double vision, eye pain Ear, nose and throat: No hearing loss, ear pain, nasal congestion, sore throat Cardiovascular: No chest pain, palpitations Respiratory:  No shortness of breath at rest or with exertion, wheezes GastrointestinaI: No nausea, vomiting, diarrhea, abdominal pain, fecal incontinence Genitourinary:  No dysuria, urinary retention or frequency Musculoskeletal:  No neck pain, back pain Integumentary: No rash, pruritus, skin lesions Neurological: as above Psychiatric: No depression, insomnia, anxiety Endocrine: No palpitations, fatigue, diaphoresis, mood swings, change in appetite, change in weight, increased thirst Hematologic/Lymphatic:  No purpura, petechiae. Allergic/Immunologic: no itchy/runny eyes, nasal congestion, recent allergic reactions, rashes  PHYSICAL EXAM: Vitals:   07/19/16 0742  BP: 120/80  Pulse: 72   General: No acute distress.  Patient appears well-groomed.  normal body habitus. Head:  Normocephalic/atraumatic Eyes:  Fundi examined but not visualized Neck: supple, no paraspinal tenderness, full range of motion Heart:  Regular rate and rhythm Lungs:  Clear to auscultation bilaterally Back: No paraspinal tenderness Neurological Exam: alert and oriented to person, place, and time (except date).  Attention span and concentration intact, delayed recall poor remote memory intact, fund of knowledge intact.   Speech fluent and not dysarthric, language intact.  CN II-XII intact. Bulk and tone normal, muscle strength 5/5 throughout.  Sensation to light touch intact.  Decreased vibration sensation in right foot.  Deep tendon reflexes 2+ throughout, toes downgoing.  Finger to nose and heel to shin testing intact.  Gait wide-based, Romberg negative.  IMPRESSION: Dizziness with increased tension-type headache.  Dizziness possibly is secondary to increase dose of donepezil or the tinnitus, which is still an issue for him.  He does not exhibit any focal symptoms suggesting stroke.  Headache may be due to the dizziness or tinnitus as well.  PLAN: 1.  He will discontinue donepezil and contact us in a week or so.  If dizziness and headache not improved, will increase nortriptyline to 15m and decide if imaging is warranted.  Advised to limit ASA and caffeine intake to no more than 2 days out of the week.  26 minutes spent face to face with patient, over 50% spent counseling.  AMetta Clines DO  CC:  WCari Caraway MD

## 2016-07-20 LAB — CUP PACEART REMOTE DEVICE CHECK: Date Time Interrogation Session: 20170713060723

## 2016-08-12 LAB — CUP PACEART REMOTE DEVICE CHECK: Date Time Interrogation Session: 20170812063843

## 2016-08-14 ENCOUNTER — Ambulatory Visit (INDEPENDENT_AMBULATORY_CARE_PROVIDER_SITE_OTHER): Payer: Medicare Other | Admitting: *Deleted

## 2016-08-14 DIAGNOSIS — R55 Syncope and collapse: Secondary | ICD-10-CM

## 2016-08-14 NOTE — Progress Notes (Signed)
Carelink Summary Report / Loop Recorder 

## 2016-08-24 ENCOUNTER — Telehealth: Payer: Self-pay | Admitting: Neurology

## 2016-08-24 NOTE — Telephone Encounter (Signed)
VM-PT left a message about a prescription refill/Dawn CB#[phone]

## 2016-08-25 ENCOUNTER — Telehealth: Payer: Self-pay | Admitting: Neurology

## 2016-08-25 ENCOUNTER — Other Ambulatory Visit: Payer: Self-pay

## 2016-08-25 MED ORDER — DONEPEZIL HCL 5 MG PO TABS: 5.0000 mg | ORAL_TABLET | Freq: Every day | ORAL | 3 refills | Status: DC

## 2016-08-25 MED ORDER — NORTRIPTYLINE HCL 25 MG PO CAPS: 75.0000 mg | ORAL_CAPSULE | Freq: Every day | ORAL | 4 refills | Status: DC

## 2016-08-25 NOTE — Telephone Encounter (Signed)
Patient states that we need to call in the donepezil 5 mg to the [facility] on [address]. He states that the Donepezil 10 mg gave him headaches  Pt phone number is [phone]

## 2016-08-25 NOTE — Telephone Encounter (Signed)
New Rx sent in

## 2016-08-25 NOTE — Telephone Encounter (Signed)
We can continue 5mg  at bedtime for now.

## 2016-08-25 NOTE — Telephone Encounter (Signed)
Spoke with patient. He has been completely off Aricept for a few weeks now. No difference in headache. Would like to go back to 5 mg. Pt states he felt he was okay on 5 mg but the increase to 10 mg was the issue. Please advise.

## 2016-08-25 NOTE — Telephone Encounter (Signed)
Refill sent in

## 2016-09-09 LAB — CUP PACEART REMOTE DEVICE CHECK: MDC IDC SESS DTM: 20170911070600

## 2016-09-09 NOTE — [place] Notes (Signed)
Carelink summary report received. Battery status OK. Normal device function. No new symptom episodes, tachy episodes, brady, or pause episodes. No new AF episodes. TWOS on presenting EGM. Monthly summary reports and ROV/PRN

## 2016-09-13 ENCOUNTER — Ambulatory Visit (INDEPENDENT_AMBULATORY_CARE_PROVIDER_SITE_OTHER): Payer: Medicare Other | Admitting: *Deleted

## 2016-09-13 DIAGNOSIS — R55 Syncope and collapse: Secondary | ICD-10-CM

## 2016-09-13 NOTE — Progress Notes (Signed)
Carelink Summary Report / Loop Recorder 

## 2016-09-15 ENCOUNTER — Telehealth: Payer: Self-pay | Admitting: *Deleted

## 2016-09-15 NOTE — Telephone Encounter (Signed)
Spoke with patient regarding LINQ at RRT.  Patient would like to avoid explant, states "let's not mess with it".  Advised patient I will make Dr. [name] aware of his decision and I will order him a Carelink monitor return kit.  Patient is agreeable to this plan.  He denies additional questions or concerns at this time.

## 2016-09-24 NOTE — [place] Notes (Signed)
[facility]. [address], [address], [place]  [address] Phone: [phone] Fax:  [phone]  Date:  09/25/2016   ID:  Brian Hoover, DOB Jun 18, 1932, MRN [id number]  PCP:  [name], MD   History of Present Illness: Brian Hoover is a 82 y.o. male here for follow up paroxysmal atrial fibrillation, not on anticoagulation because of bleeding, see below, hypertension, fourth nerve palsy resolved, remote midbrain hemorrhage, disequilibrium, tinnitus, neurologist Dr. Tomi Likens.   Prior cardiac history includes frequent PVCs which were successfully ablated by Dr. [name] with resolution of symptoms. During those PVCs, he ended up having cardiomyopathy with decreased ejection fraction.  Recently he has been battling labile hypertension and most recent medication has been clonidine to be taken on as-needed basis with systolic blood pressure greater than 160. When he took clonidine once for instance, his blood pressure dropped to the 90s and he did not feel well. Appreciate pharmacy assistance with hypertension clinic.  Blood pressure had been fairly labile from 120s to 170s. He was concerned  because of MRI results that showed remote hemorrhage in midbrain which is likely secondary from hypertension. He had fourth nerve palsy which was detected by his ophthalmologist and symptoms were double vision. This occurred in December 2016. Note he has not been on anticoagulation for his atrial fibrillation at his own volition.   Previously, was walking 3 miles into walk. Was dizzy, felt completely off-balance, did not lose consciousness. Collapsed, fell tore forehead. He tried to get up but ended up leaning on trash cans. Very concerning to him. He did not feel palpitations previously. He notes that he woke up a few months ago and he noticed that the room was spinning. He has had this off and on for several months. He notes that if he bends down and then gets up quickly, he will have some  dizziness.  Event monitor placed a November 2013 did demonstrate short burst of nonsustained ventricular tachycardia. Because of this, he ended up seeing Dr. Cristopher Peru once again. Since he did not have any recurrence of episodes at that time, Dr. [name] endorsed implantable loop recorder. On interrogation showed no sustained arrhythmias or bradycardic episodes, however atrial fibrillation was noted.  He has also seen neurology regarding this matter. Unfortunately, he sustained  fall previously at [facility] fractured ribs, pneumothorax. He slipped on wet shower floor. Clearly no syncope.   His main complaint currently seems to be back pain which is limiting him. He is quite depressed about this.     Wt Readings from Last 3 Encounters:  09/25/16 174 lb (78.9 kg)  07/19/16 175 lb (79.4 kg)  05/22/16 178 lb (80.7 kg)     Past Medical History:  Diagnosis Date  . Bradycardia   . CHF (congestive heart failure) ([place])   . Chronic systolic heart failure ([place])   . COPD (chronic obstructive pulmonary disease) ([place])   . Dyspnea   . GERD (gastroesophageal reflux disease)   . MGUS (monoclonal gammopathy of unknown significance) 01/25/2015  . Migraine   . Pneumothorax 01/28/2013  . PVC (premature ventricular contraction)   . Syncope    s/p Medtronic ILR implant 05/2013 by Dr [name]    Past Surgical History:  Procedure Laterality Date  . CHEST TUBE INSERTION Right 01/29/2013  . CLAVICLE SURGERY    . LOOP RECORDER IMPLANT  06/02/2013   Medtronic LinQ implanted for syncope by Dr [name]  . LOOP RECORDER IMPLANT N/A 06/02/2013   Procedure:  LOOP RECORDER IMPLANT;  Surgeon: [name], MD;  Location: [facility] CATH LAB;  Service: Cardiovascular;  Laterality: N/A;  . PROSTATE SURGERY    . SHOULDER ARTHROSCOPY    . TENDON REPAIR     right foot    Current Outpatient Prescriptions  Medication Sig Dispense Refill  . aspirin 325 MG tablet Take 325 mg by mouth as needed for headache.    Marland Kitchen aspirin EC 81 MG  tablet Take 81 mg by mouth daily.    . celecoxib (CELEBREX) 50 MG capsule Take 50 mg by mouth daily.    . cholecalciferol (VITAMIN D) 1000 UNITS tablet Take 2,000 Units by mouth daily.     . cloNIDine (CATAPRES) 0.1 MG tablet Take 1 tablet (0.1 mg total) by mouth daily. As needed for systolic blood pressure > 160 mmHg 30 tablet 2  . donepezil (ARICEPT) 5 MG tablet Take 1 tablet (5 mg total) by mouth at bedtime. 30 tablet 3  . lisinopril (PRINIVIL,ZESTRIL) 40 MG tablet Take 1 tablet (40 mg total) by mouth daily. 30 tablet 6  . Multiple Vitamins-Minerals (MULTIVITAMIN WITH MINERALS) tablet Take 1 tablet by mouth daily.    . nortriptyline (PAMELOR) 25 MG capsule Take 3 capsules (75 mg total) by mouth at bedtime. 90 capsule 4  . OLANZapine (ZYPREXA) 10 MG tablet Take 10 mg by mouth at bedtime as needed (as needed to ward off  cluster headaches.).     Marland Kitchen sildenafil (VIAGRA) 100 MG tablet Take 100 mg by mouth daily as needed for erectile dysfunction.    . valACYclovir (VALTREX) 500 MG tablet Take 500 mg by mouth daily.     No current facility-administered medications for this visit.     Allergies:    Allergies  Allergen Reactions  . Sulfa Antibiotics Hives    Hives     Social History:  The patient  reports that he quit smoking about 55 years ago. His smoking use included Cigarettes. He has a 15.00 pack-year smoking history. He has never used smokeless tobacco. He reports that he drinks about 6.0 oz of alcohol per week . He reports that he does not use drugs.   ROS:  Please see the history of present illness.   Disequilibrium. No chest pain, no shortness of breath, no further syncopal episodes    PHYSICAL EXAM: VS:  BP 118/80   Pulse 61   Ht 5\' 10"  (1.778 m)   Wt 174 lb (78.9 kg)   BMI 24.97 kg/m  Well nourished, well developed, in no acute distress  HEENT: normal  Neck: no JVD  Cardiac:  normal S1, S2; RRR; no murmur, no ectopic beat  Lungs:  clear to auscultation bilaterally, no  wheezing, rhonchi or rales  Abd: soft, nontender, no hepatomegaly  Ext: no edema  Skin: warm and dry  Neuro: no focal abnormalities noted  EKG:  EKG ordered today 09/25/16-sinus rhythm, first-degree AV block PR interval 246 ms, rate 61, nonspecific interventricular conduction delay, right bundle branch block like appearance with T-wave inversion V3 through V6 as well as 3 and aVF.Marland Kitchen Previous showed PVCs.  .  Echocardiogram 03/30/16: - Left ventricle: The cavity size was normal. Wall thickness was   normal. Systolic function was mildly to moderately reduced. The   estimated ejection fraction was in the range of 40% to 45%. There   is akinesis of the inferoseptal myocardium. Doppler parameters   are consistent with abnormal left ventricular relaxation (grade 1   diastolic dysfunction). - Aortic  valve: There was trivial regurgitation. - Left atrium: The atrium was moderately dilated. - Right ventricle: The cavity size was mildly dilated. Wall   thickness was normal. - Pulmonic valve: There was moderate regurgitation.  Impressions:  - Compared to the prior study, there has been no significant   interval change.  ASSESSMENT AND PLAN:  1. Paroxysmal atrial fibrillation-risks and benefits discussed. Age 85, and decreased ejection fraction CHADSVASc score of at least 3, perhaps 5 if we are including small remote hemorrhage of mid brain resulting in fourth nerve palsy. He has discussed restarting Xarelto or Eliquis with Dr. Tomi Likens his neurologist. This was stopped. He is only taking low-dose aspirin. Understand stroke risk. They are concerned about the possibility worsening hemorrhage. In the past, prior to his fourth nerve palsy, we started Xarelto for him however he did up stopping this because of increased bleeding. He was noticing clotted blood in his mouth repeatedly, sometimes darker stools. After stopping the Xarelto all of this has subsided and is relieved. We had a lengthy discussion about  the importance of anticoagulation and reduction of stroke. Encouraged him to seek the care of dentist, he has not seen in a while, to make sure that he does not have any gum disease that would lead to bleeding. Explained to him that often we are able to control the minor bleeding in order to help prevent a major stroke.  He is currently taking low-dose aspirin. At this point, he has decided to continue aspirin only. He will not retry anticoagulation. 2. Disequilibrium- neurology has evaluated. MRA/MRI of brain have been reassuring in the past however he has most recently had an MRI on 02/01/16 which showed small area of remote hemorrhage in midbrain responsible for fourth nerve palsy, double vision. He has tried physical therapy. No recent medication initiations. No tremors. 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 He has been able to however go on a recent cruise to the [place], sailing vessel, star riggers. He also had ringing and is here, left side and he wonders if there is a correlation. He's depressed about this, back pain also limits him. 3. Syncope-no further episodes. Implantable loop recorder has been recently interrogated and showed only atrial fibrillation brief episodes multiple, 10 minutes duration for instance in October and November 2016, no significant bradycardia Dr. Tanna Furry note reviewed. The useful battery life of this device has expired. He was contacted about explantation however he wishes to continue with device in place. 4. PVCs-no ectopy heard on exam. He is status post radiofrequency ablation PVCs by Dr. [name].  5. Hypertension-currently improved controlled. Appreciate [place] in the pharmacy clinic helping to titrate lisinopril. In the distant past he remembers having a "scratchy throat "with lisinopril perhaps. Currently he is tolerating this well. Continue. He took 1 dose of clonidine however this dropped his blood pressure to the 90s and he  did not feel well. He will be hesitant to use again. His blood pressure log is overall fairly reassuring with blood pressures ranging from [id number] today systolic to [id number] systolic. Labile blood pressures noted. He did ask about the correlation of labile blood pressure and dementia. He is on Aricept. 6. Chronic systolic heart failure-very well compensated, minimal shortness of breath. NYHA class I-II which she attributes to deconditioning. Recent ejection fraction 45%. ACE inhibitor. No further beta blocker due to bradycardia. 7. 69-month follow up   Signed, Candee Furbish, MD [facility]  09/25/2016 9:31 AM

## 2016-09-25 ENCOUNTER — Ambulatory Visit (INDEPENDENT_AMBULATORY_CARE_PROVIDER_SITE_OTHER): Payer: Medicare Other | Admitting: Cardiology

## 2016-09-25 ENCOUNTER — Encounter: Payer: Self-pay | Admitting: Cardiology

## 2016-09-25 VITALS — BP 118/80 | HR 61 | Ht 70.0 in | Wt 174.0 lb

## 2016-09-25 DIAGNOSIS — I480 Paroxysmal atrial fibrillation: Secondary | ICD-10-CM | POA: Diagnosis not present

## 2016-09-25 DIAGNOSIS — Z7901 Long term (current) use of anticoagulants: Secondary | ICD-10-CM

## 2016-09-25 DIAGNOSIS — I5022 Chronic systolic (congestive) heart failure: Secondary | ICD-10-CM

## 2016-09-25 NOTE — Patient Instructions (Signed)

## 2016-10-06 ENCOUNTER — Telehealth: Payer: Self-pay | Admitting: Internal Medicine

## 2016-10-06 ENCOUNTER — Encounter: Payer: Self-pay | Admitting: Internal Medicine

## 2016-10-09 ENCOUNTER — Other Ambulatory Visit: Payer: Self-pay

## 2016-10-09 MED ORDER — NORTRIPTYLINE HCL 25 MG PO CAPS: 75.0000 mg | ORAL_CAPSULE | Freq: Every day | ORAL | 1 refills | Status: DC

## 2016-10-11 ENCOUNTER — Other Ambulatory Visit: Payer: Self-pay | Admitting: Cardiology

## 2016-10-11 MED ORDER — LISINOPRIL 40 MG PO TABS: 40.0000 mg | ORAL_TABLET | Freq: Every day | ORAL | 3 refills | Status: DC

## 2016-10-15 LAB — CUP PACEART REMOTE DEVICE CHECK
Date Time Interrogation Session: 20171011083852
MDC IDC PG IMPLANT DT: 20140630

## 2016-10-15 NOTE — [place] Notes (Signed)
Carelink summary report received. Battery status RRT.  Pt prefers not to have explanted.  Normal device function. No new symptom episodes, tachy episodes, brady, or pause episodes. 2 AF episodes, no [place] after discussion with neurologist.

## 2016-10-20 ENCOUNTER — Telehealth: Payer: Self-pay | Admitting: Cardiology

## 2016-10-20 NOTE — Telephone Encounter (Signed)
Spoke w/ pt and requested that he send a manual transmission b/c his home monitor has not updated in at least 14 days.   

## 2016-10-23 ENCOUNTER — Telehealth: Payer: Self-pay | Admitting: Cardiology

## 2016-10-23 NOTE — Telephone Encounter (Signed)
I did not need this encounter. °

## 2016-10-30 ENCOUNTER — Telehealth: Payer: Self-pay | Admitting: Cardiology

## 2016-10-30 MED ORDER — AMLODIPINE BESYLATE 2.5 MG PO TABS: 2.5000 mg | ORAL_TABLET | Freq: Every day | ORAL | 6 refills | Status: DC

## 2016-10-30 NOTE — Telephone Encounter (Signed)
New message  Concerned about BP  excessive readings  Have exceeded systolic pressure over 0000000 several occassions  Does not have current readings  Headaches   Takes Clonidine for this

## 2016-10-30 NOTE — Telephone Encounter (Signed)
Spoke with pt. He reports BP high for at least 2 weeks.  Takes clonidine prn for BP above 160.  Has been taking on a daily basis for last 2 weeks.  Has had to take twice on occasion and one time had to take 3 times in a day. Reports BP is always around 150. Systolic around 90.  Frequent headaches. Reports neuro has him on nortriptyline for this.  No chest pain. Feels sluggish. Reviewed with Dr. [name] and pt should start amlodipine 2.5 mg by mouth daily and continue to monitor BP I spoke with pt and gave him information from Dr. [name].  Will send prescription to [facility].  I told pt he should start amlodipine today.

## 2016-10-30 NOTE — Telephone Encounter (Signed)
Please advise 

## 2016-11-21 ENCOUNTER — Ambulatory Visit: Payer: Medicare Other | Admitting: Neurology

## 2016-11-22 ENCOUNTER — Emergency Department (HOSPITAL_BASED_OUTPATIENT_CLINIC_OR_DEPARTMENT_OTHER): Payer: Medicare Other

## 2016-11-22 ENCOUNTER — Emergency Department (HOSPITAL_BASED_OUTPATIENT_CLINIC_OR_DEPARTMENT_OTHER)
Admission: EM | Admit: 2016-11-22 | Discharge: 2016-11-22 | Disposition: A | Discharge status: Home / Self Care | Payer: Medicare Other | Attending: Physician Assistant | Admitting: Physician Assistant

## 2016-11-22 ENCOUNTER — Encounter (HOSPITAL_BASED_OUTPATIENT_CLINIC_OR_DEPARTMENT_OTHER): Payer: Self-pay

## 2016-11-22 DIAGNOSIS — R1032 Left lower quadrant pain: Secondary | ICD-10-CM | POA: Diagnosis present

## 2016-11-22 DIAGNOSIS — Z79899 Other long term (current) drug therapy: Secondary | ICD-10-CM | POA: Diagnosis not present

## 2016-11-22 DIAGNOSIS — Z87891 Personal history of nicotine dependence: Secondary | ICD-10-CM | POA: Diagnosis not present

## 2016-11-22 DIAGNOSIS — Z7982 Long term (current) use of aspirin: Secondary | ICD-10-CM | POA: Diagnosis not present

## 2016-11-22 DIAGNOSIS — I5022 Chronic systolic (congestive) heart failure: Secondary | ICD-10-CM | POA: Diagnosis not present

## 2016-11-22 DIAGNOSIS — K5900 Constipation, unspecified: Secondary | ICD-10-CM | POA: Insufficient documentation

## 2016-11-22 DIAGNOSIS — N201 Calculus of ureter: Secondary | ICD-10-CM | POA: Diagnosis not present

## 2016-11-22 DIAGNOSIS — J449 Chronic obstructive pulmonary disease, unspecified: Secondary | ICD-10-CM | POA: Insufficient documentation

## 2016-11-22 HISTORY — DX: Dorsalgia, unspecified: M54.9

## 2016-11-22 LAB — URINALYSIS, ROUTINE W REFLEX MICROSCOPIC
Bilirubin Urine: NEGATIVE
GLUCOSE, UA: NEGATIVE mg/dL
HGB URINE DIPSTICK: NEGATIVE
Ketones, ur: NEGATIVE mg/dL
LEUKOCYTES UA: NEGATIVE
Nitrite: NEGATIVE
PH: 6.5 (ref 5.0–8.0)
PROTEIN: NEGATIVE mg/dL
SPECIFIC GRAVITY, URINE: 1.014 (ref 1.005–1.030)

## 2016-11-22 LAB — CBC
HEMATOCRIT: 41.4 % (ref 39.0–52.0)
Hemoglobin: 14.3 g/dL (ref 13.0–17.0)
MCH: 31.5 pg (ref 26.0–34.0)
MCHC: 34.5 g/dL (ref 30.0–36.0)
MCV: 91.2 fL (ref 78.0–100.0)
Platelets: 237 10*3/uL (ref 150–400)
RBC: 4.54 MIL/uL (ref 4.22–5.81)
RDW: 13.1 % (ref 11.5–15.5)
WBC: 11.7 10*3/uL — AB (ref 4.0–10.5)

## 2016-11-22 LAB — COMPREHENSIVE METABOLIC PANEL
ALT: 19 U/L (ref 17–63)
AST: 21 U/L (ref 15–41)
Albumin: 3.7 g/dL (ref 3.5–5.0)
Alkaline Phosphatase: 61 U/L (ref 38–126)
Anion gap: 8 (ref 5–15)
BUN: 24 mg/dL — ABNORMAL HIGH (ref 6–20)
CHLORIDE: 96 mmol/L — AB (ref 101–111)
CO2: 26 mmol/L (ref 22–32)
Calcium: 9 mg/dL (ref 8.9–10.3)
Creatinine, Ser: 1 mg/dL (ref 0.61–1.24)
Glucose, Bld: 91 mg/dL (ref 65–99)
POTASSIUM: 3.9 mmol/L (ref 3.5–5.1)
SODIUM: 130 mmol/L — AB (ref 135–145)
Total Bilirubin: 0.6 mg/dL (ref 0.3–1.2)
Total Protein: 6.7 g/dL (ref 6.5–8.1)

## 2016-11-22 LAB — OCCULT BLOOD X 1 CARD TO LAB, STOOL: Fecal Occult Bld: NEGATIVE

## 2016-11-22 LAB — LIPASE, BLOOD: LIPASE: 33 U/L (ref 11–51)

## 2016-11-22 MED ORDER — IOPAMIDOL (ISOVUE-300) INJECTION 61%
100.0000 mL | Freq: Once | INTRAVENOUS | Status: AC | PRN
  Administered 2016-11-22: 100 mL via INTRAVENOUS

## 2016-11-22 NOTE — Discharge Instructions (Signed)
Start taking MiraLAX for constipation. Continue taking Celebrex for pain. You have a 2 mm stone in your left ureter which is causing her pain. Please strain all urine and urine to attempt to catch this. Please follow-up with urology within the next 7 days. Return to the emergency department for uncontrolled pain, fever, nausea, vomiting, inability to tolerate oral intake, or any new or concerning symptoms.

## 2016-11-22 NOTE — ED Triage Notes (Addendum)
Pt c/o abd pain x 1 week-constipation however had BM today seen by PCP-xray after BM showed stool-concerns for obstruction or infection due to elevated WBC per pt-sent to ED-NAD-steady gait

## 2016-11-22 NOTE — ED Provider Notes (Signed)
[facility] DEPT [facility] Provider Note   CSN: [id number] Arrival date & time: 11/22/16  1714  By signing my name below, I, Dora Sims, attest that this documentation has been prepared under the direction and in the presence Gloriann Loan, PA-C. Electronically Signed: Dora Sims, Scribe. 11/22/2016. 6:05 PM.  History   Chief Complaint Chief Complaint  Patient presents with  . Abdominal Pain    The history is provided by the patient. No language interpreter was used.     HPI Comments: Brian Hoover is a 82 y.o. male who presents to the Emergency Department complaining of constant, aching, left lower abdominal pain for one week. He states the pain is worse with deep inhalation. Pt has tried heat therapy with minimal relief of his abdominal pain and notes difficulty sleeping due to the severity of his pain. He reports some associated decreased appetite, nausea, and constipation since onset. He describes his stool as "knots." He states he has had some rectal bleeding due to straining during bowel movements but denies hematochezia or melena. He was seen at [facility] today and states he had a solid bowel movement with transient relief of his abdominal pain. He notes a h/o abdominal hernia repair as well as prostate surgery. Pt reports he has been taking Tramadol for a couple of months due to pre-existing back pain; he has stopped taking it over the last couple of days after learning from his PCP that constipation is a potential side effect. No h/o similar abdominal pain in the past. No recent dietary changes. He denies fever, chills, dysuria, hematuria, vomiting, or any other associated symptoms.  Past Medical History:  Diagnosis Date  . Back pain   . Bradycardia   . CHF (congestive heart failure) ([place])   . Chronic systolic heart failure ([place])   . COPD (chronic obstructive pulmonary disease) ([place])   . Dyspnea   . GERD (gastroesophageal reflux disease)   . MGUS (monoclonal  gammopathy of unknown significance) 01/25/2015  . Migraine   . Pneumothorax 01/28/2013  . PVC (premature ventricular contraction)   . Syncope    s/p Medtronic ILR implant 05/2013 by Dr [name]    Patient Active Problem List   Diagnosis Date Noted  . Chronic anticoagulation 10/11/2015  . Atrial fibrillation ([place]) 09/15/2015  . Sinus node dysfunction ([place]) 09/15/2015  . Cough 08/01/2015  . Cephalalgia 04/26/2015  . MGUS (monoclonal gammopathy of unknown significance) 01/25/2015  . Essential hypertension 11/03/2013  . Dizziness and giddiness 05/08/2013  . Near syncope 05/08/2013  . Right foot drop 03/11/2013  . Unspecified constipation 02/04/2013  . Traumatic pneumothorax (Right) 01/29/2013  . Multiple rib fractures (Right) 01/29/2013  . Syncope 10/29/2012  . Pruritus 01/20/2012  . PVC's (premature ventricular contractions) 09/20/2009  . Chronic systolic heart failure ([place]) 09/20/2009  . BRADYCARDIA 09/01/2009  . UPPER RESPIRATORY INFECTION, ACUTE 09/01/2009  . Centrilobular emphysema ([place]) 05/04/2009  . DYSPNEA 03/22/2009    Past Surgical History:  Procedure Laterality Date  . CHEST TUBE INSERTION Right 01/29/2013  . CLAVICLE SURGERY    . LOOP RECORDER IMPLANT  06/02/2013   Medtronic LinQ implanted for syncope by Dr [name]  . LOOP RECORDER IMPLANT N/A 06/02/2013   Procedure: LOOP RECORDER IMPLANT;  Surgeon: [name], MD;  Location: [facility] CATH LAB;  Service: Cardiovascular;  Laterality: N/A;  . PROSTATE SURGERY    . SHOULDER ARTHROSCOPY    . TENDON REPAIR     right foot    OB History  No data available       Home Medications    Prior to Admission medications   Medication Sig Start Date End Date Taking? Authorizing Provider  amLODipine (NORVASC) 2.5 MG tablet Take 1 tablet (2.5 mg total) by mouth daily. 10/30/16 01/28/17  Jerline Pain, MD  aspirin 325 MG tablet Take 325 mg by mouth as needed for headache.    Historical Provider, MD  aspirin EC 81 MG tablet Take 81  mg by mouth daily.    Historical Provider, MD  celecoxib (CELEBREX) 50 MG capsule Take 50 mg by mouth daily.    Historical Provider, MD  cholecalciferol (VITAMIN D) 1000 UNITS tablet Take 2,000 Units by mouth daily.     Historical Provider, MD  cloNIDine (CATAPRES) 0.1 MG tablet Take 1 tablet (0.1 mg total) by mouth daily. As needed for systolic blood pressure > 160 mmHg 02/22/16   Jerline Pain, MD  donepezil (ARICEPT) 5 MG tablet Take 1 tablet (5 mg total) by mouth at bedtime. 08/25/16   Pieter Partridge, DO  lisinopril (PRINIVIL,ZESTRIL) 40 MG tablet Take 1 tablet (40 mg total) by mouth daily. 10/11/16   Jerline Pain, MD  Multiple Vitamins-Minerals (MULTIVITAMIN WITH MINERALS) tablet Take 1 tablet by mouth daily.    Historical Provider, MD  nortriptyline (PAMELOR) 25 MG capsule Take 3 capsules (75 mg total) by mouth at bedtime. 10/09/16   Pieter Partridge, DO  OLANZapine (ZYPREXA) 10 MG tablet Take 10 mg by mouth at bedtime as needed (as needed to ward off  cluster headaches.).     Historical Provider, MD  sildenafil (VIAGRA) 100 MG tablet Take 100 mg by mouth daily as needed for erectile dysfunction.    Historical Provider, MD  valACYclovir (VALTREX) 500 MG tablet Take 500 mg by mouth daily.    Historical Provider, MD    Family History Family History  Problem Relation Age of Onset  . Breast cancer Mother   . Uterine cancer Mother   . Rheum arthritis Son     Social History Social History  Substance Use Topics  . Smoking status: Former Smoker    Packs/day: 1.50    Years: 10.00    Types: Cigarettes    Quit date: 12/04/1960  . Smokeless tobacco: Never Used  . Alcohol use 6.0 oz/week    10 Shots of liquor per week     Comment: 4 Drinks a week     Allergies   Sulfa antibiotics   Review of Systems Review of Systems  Constitutional: Positive for appetite change. Negative for chills and fever.  Gastrointestinal: Positive for abdominal pain, anal bleeding, constipation and nausea. Negative  for blood in stool and vomiting.  Genitourinary: Negative for dysuria and hematuria.  Musculoskeletal: Positive for back pain (unrelated to abdominal pain).  All other systems reviewed and are negative.    Physical Exam Updated Vital Signs BP 140/94 (BP Location: Right Arm)   Pulse 65   Temp 98.1 F (36.7 C) (Oral)   Resp 16   Ht 5\' 9"  (1.753 m)   Wt 78.9 kg   SpO2 98%   BMI 25.70 kg/m   Physical Exam  Constitutional: He is oriented to person, place, and time. He appears well-developed and well-nourished.  Non-toxic appearance. He does not have a sickly appearance. He does not appear ill.  HENT:  Head: Normocephalic and atraumatic.  Mouth/Throat: Oropharynx is clear and moist.  Eyes: Conjunctivae are normal.  Neck: Normal range of motion. Neck supple.  Cardiovascular: Normal rate and regular rhythm.   Pulmonary/Chest: Effort normal and breath sounds normal. No accessory muscle usage or stridor. No respiratory distress. He has no wheezes. He has no rhonchi. He has no rales.  Abdominal: Soft. Bowel sounds are normal. He exhibits no distension. There is tenderness in the left lower quadrant. There is no rebound and no guarding.  Genitourinary:  Genitourinary Comments: Non thrombosed external hemorrhoids.  No palpable stool in the rectal vault.  No gross blood.   Musculoskeletal: Normal range of motion.  Lymphadenopathy:    He has no cervical adenopathy.  Neurological: He is alert and oriented to person, place, and time.  Speech clear without dysarthria.  Skin: Skin is warm and dry.  Psychiatric: He has a normal mood and affect. His behavior is normal.     ED Treatments / Results  Labs (all labs ordered are listed, but only abnormal results are displayed) Labs Reviewed  COMPREHENSIVE METABOLIC PANEL - Abnormal; Notable for the following:       Result Value   Sodium 130 (*)    Chloride 96 (*)    BUN 24 (*)    All other components within normal limits  CBC - Abnormal;  Notable for the following:    WBC 11.7 (*)    All other components within normal limits  LIPASE, BLOOD  URINALYSIS, ROUTINE W REFLEX MICROSCOPIC  OCCULT BLOOD X 1 CARD TO LAB, STOOL    EKG  EKG Interpretation None       Radiology Ct Abdomen Pelvis W Contrast  Result Date: 11/22/2016 CLINICAL DATA:  LEFT lower quadrant pain with nausea and constipation for 1 week. Assess for obstruction versus diverticulitis versus constipation. History of prostate cancer, monoclonal gammopathy. EXAM: CT ABDOMEN AND PELVIS WITH CONTRAST TECHNIQUE: Multidetector CT imaging of the abdomen and pelvis was performed using the standard protocol following bolus administration of intravenous contrast. CONTRAST:  133mL ISOVUE-300 IOPAMIDOL (ISOVUE-300) INJECTION 61% COMPARISON:  MRI lumbar spine September 29, 2016 and abdominal radiograph November 22, 2016 FINDINGS: LOWER CHEST: Lung bases are clear. Included heart size is normal. No pericardial effusion. HEPATOBILIARY: Minimal intrahepatic biliary dilatation LEFT lobe. Punctate calcified granulomas RIGHT lobe of the liver. Patent portal vein. No hepatic mass. Gallbladder is normal. PANCREAS: Normal. SPLEEN: Normal. ADRENALS/URINARY TRACT: Kidneys are orthotopic, demonstrating symmetric enhancement. No nephrolithiasis, or solid renal masses. Mild LEFT hydronephrosis. LEFT urothelial enhancement and periureteral fat stranding. 2 mm distal LEFT ureteral calculus. Too small to characterize hypodensities bilateral kidneys. Delayed imaging through the kidneys demonstrates symmetric prompt contrast excretion within the proximal urinary collecting system. Urinary bladder is well distended, harboring no intravesicular calculi. Normal adrenal glands. STOMACH/BOWEL: Small hiatal hernia. The stomach, small and large bowel are normal in course and caliber without inflammatory changes. Moderate sigmoid diverticulosis. Mild amount of retained large bowel stool. Normal appendix.  VASCULAR/LYMPHATIC: Aortoiliac vessels are normal in course and caliber, mild calcific atherosclerosis. No lymphadenopathy by CT size criteria. REPRODUCTIVE: Mild prostatomegaly. Central hypodensity within the prostate most consistent with TURP. Prostate invades the base the urinary bladder. OTHER: No intraperitoneal free fluid or free air. MUSCULOSKELETAL: Nonacute. Old RIGHT rib fractures. Bilateral sacroiliac ankylosis. Mild bilateral hip osteoarthrosis. Osteopenia. Old mild to moderate T12 compression fracture. IMPRESSION: 2 mm distal LEFT ureteral calculus results in mild obstructive uropathy. Mild LEFT urothelial enhancement may be infectious or inflammatory. Sigmoid diverticulosis without acute diverticulitis or bowel obstruction. Mild amount of retained large bowel stool. Electronically Signed   By: Thana Farr.D.  On: 11/22/2016 20:32    Procedures Procedures (including critical care time)  DIAGNOSTIC STUDIES: Oxygen Saturation is 99% on RA, normal by my interpretation.    COORDINATION OF CARE: 6:15 PM Discussed treatment plan with pt at bedside and pt agreed to plan.  Medications Ordered in ED Medications  iopamidol (ISOVUE-300) 61 % injection 100 mL (100 mLs Intravenous Contrast Given 11/22/16 2014)     Initial Impression / Assessment and Plan / ED Course  I have reviewed the triage vital signs and the nursing notes.  Pertinent labs & imaging results that were available during my care of the patient were reviewed by me and considered in my medical decision making (see chart for details).  Clinical Course    Patient presents with LLQ abdominal pain and constipation.  Vitals reassuring.  Abdomen soft with LLQ tenderness without rebound, guarding, or rigidity.  DRE revealed no palpable stool and no blood.  Labs obtained and largely unremarkable.  CT obtained to evaluate for diverticulitis vs constipation and revealed a 2 mm left ureteral calculus.  UA is non-infectious.   Patient deferred any pain medication in ED.  Discussed straining urine.  Patient feels his pain is controlled with his Celebrex.  Discussed avoiding Tramadol as this may worsen his constipation.  He is going to start taking Miralax.  Follow up with urology within the next 7 days.  Return precautions discussed.  Stable for discharge.   Case has been discussed with Dr. Thomasene Lot who agrees with the above plan for discharge.    Final Clinical Impressions(s) / ED Diagnoses   Final diagnoses:  Calculus of ureter  Constipation, unspecified constipation type    New Prescriptions Discharge Medication List as of 11/22/2016  9:04 PM     I personally performed the services described in this documentation, which was scribed in my presence. The recorded information has been reviewed and is accurate.    Gloriann Loan, PA-C 11/23/16 0048    Courteney Julio Alm, MD 11/23/16 2220

## 2016-11-23 ENCOUNTER — Encounter: Payer: Self-pay | Admitting: Neurology

## 2016-12-15 ENCOUNTER — Encounter (INDEPENDENT_AMBULATORY_CARE_PROVIDER_SITE_OTHER): Payer: Self-pay

## 2016-12-15 ENCOUNTER — Encounter: Payer: Self-pay | Admitting: Internal Medicine

## 2016-12-15 ENCOUNTER — Ambulatory Visit (INDEPENDENT_AMBULATORY_CARE_PROVIDER_SITE_OTHER): Payer: Medicare Other | Admitting: Internal Medicine

## 2016-12-15 VITALS — BP 142/68 | HR 82 | Ht 70.0 in | Wt 175.2 lb

## 2016-12-15 DIAGNOSIS — I480 Paroxysmal atrial fibrillation: Secondary | ICD-10-CM | POA: Diagnosis not present

## 2016-12-15 DIAGNOSIS — I495 Sick sinus syndrome: Secondary | ICD-10-CM
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
## 2016-12-15 NOTE — Patient Instructions (Addendum)
Medication Instructions:    Your physician recommends that you continue on your current medications as directed. Please refer to the Current Medication list given to you today.  --- If you need a refill on your cardiac medications before your next appointment, please call your pharmacy. ---  Labwork:  None ordered  Testing/Procedures:  None ordered  Follow-Up:  No follow up is needed at this time with Dr. [name] .  He will see you on an as needed basis.   Thank you for choosing [facility]!!

## 2016-12-20 ENCOUNTER — Ambulatory Visit: Payer: Medicare Other | Admitting: Internal Medicine

## 2017-01-04 ENCOUNTER — Encounter: Payer: Self-pay | Admitting: Internal Medicine

## 2017-01-04 ENCOUNTER — Ambulatory Visit (INDEPENDENT_AMBULATORY_CARE_PROVIDER_SITE_OTHER)
Admission: RE | Admit: 2017-01-04 | Discharge: 2017-01-04 | Disposition: A | Discharge status: Home / Self Care | Payer: Medicare Other | Source: Ambulatory Visit | Attending: Internal Medicine | Admitting: Internal Medicine

## 2017-01-04 ENCOUNTER — Ambulatory Visit (INDEPENDENT_AMBULATORY_CARE_PROVIDER_SITE_OTHER): Payer: Medicare Other | Admitting: Internal Medicine

## 2017-01-04 VITALS — BP 118/80 | HR 61 | Ht 70.0 in | Wt 178.4 lb

## 2017-01-04 DIAGNOSIS — J209 Acute bronchitis, unspecified: Secondary | ICD-10-CM

## 2017-01-04 DIAGNOSIS — L299 Pruritus, unspecified: Secondary | ICD-10-CM

## 2017-01-04 DIAGNOSIS — I482 Chronic atrial fibrillation, unspecified: Secondary | ICD-10-CM

## 2017-01-04 DIAGNOSIS — J42 Unspecified chronic bronchitis: Secondary | ICD-10-CM

## 2017-01-04 DIAGNOSIS — J441 Chronic obstructive pulmonary disease with (acute) exacerbation: Secondary | ICD-10-CM | POA: Diagnosis not present

## 2017-01-04 MED ORDER — LEVALBUTEROL HCL 0.63 MG/3ML IN NEBU
0.6300 mg | INHALATION_SOLUTION | Freq: Once | RESPIRATORY_TRACT | Status: AC
  Administered 2017-01-04: 0.63 mg via RESPIRATORY_TRACT

## 2017-01-04 MED ORDER — UMECLIDINIUM-VILANTEROL 62.5-25 MCG/INH IN AEPB: 1.0000 | INHALATION_SPRAY | Freq: Every day | RESPIRATORY_TRACT | 0 refills | Status: AC
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
## 2017-01-04 NOTE — Patient Instructions (Signed)
Neb xop 0.63      Dx exacerbation chronic bronchitis  Order CXR     Dx exacerbation chronic bronchitis  Sample Anoro Elipta    Inhale 1 puff, once daily     See if it eases the way your chest feels

## 2017-01-05 DIAGNOSIS — J441 Chronic obstructive pulmonary disease with (acute) exacerbation: Secondary | ICD-10-CM | POA: Insufficient documentation

## 2017-01-05 NOTE — Assessment & Plan Note (Signed)
Rhythm is regular or nearly so on exam at this visit. He is followed by cardiology.
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
## 2017-01-05 NOTE — Assessment & Plan Note (Signed)
Previous concern but has not recurred even now when winter he may trigger dry skin problems.

## 2017-01-10 ENCOUNTER — Other Ambulatory Visit: Payer: Self-pay | Admitting: Internal Medicine

## 2017-01-25 ENCOUNTER — Ambulatory Visit: Payer: Medicare Other | Admitting: Neurology

## 2017-02-09 ENCOUNTER — Encounter: Payer: Self-pay | Admitting: Neurology

## 2017-02-09 ENCOUNTER — Ambulatory Visit (INDEPENDENT_AMBULATORY_CARE_PROVIDER_SITE_OTHER): Payer: Medicare Other | Admitting: Neurology

## 2017-02-09 VITALS — BP 120/80 | HR 80 | Ht 70.0 in | Wt 178.4 lb

## 2017-02-09 DIAGNOSIS — G3184 Mild cognitive impairment, so stated: Secondary | ICD-10-CM

## 2017-02-09 DIAGNOSIS — M545 Low back pain, unspecified: Secondary | ICD-10-CM

## 2017-02-09 DIAGNOSIS — G44219 Episodic tension-type headache, not intractable: Secondary | ICD-10-CM | POA: Diagnosis not present

## 2017-02-09 DIAGNOSIS — I619 Nontraumatic intracerebral hemorrhage, unspecified: Secondary | ICD-10-CM | POA: Diagnosis not present

## 2017-02-09 DIAGNOSIS — G8929 Other chronic pain: Secondary | ICD-10-CM | POA: Diagnosis not present

## 2017-02-09 MED ORDER — DONEPEZIL HCL 10 MG PO TABS: 10.0000 mg | ORAL_TABLET | Freq: Every day | ORAL | 6 refills | Status: DC

## 2017-02-09 NOTE — Progress Notes (Addendum)
NEUROLOGY FOLLOW UP OFFICE NOTE  Brian Hoover [id number]  HISTORY OF PRESENT ILLNESS: Brian Hoover is an 83 year old right-handed man with cluster headaches, COPD, HSV II, CHF, GERD, chronic systolic heart failure, PVCs, and history of pheumothorax follows up for headache and dizziness.       HEMORRHAGIC STROKE/CRANIAL FOURTH NERVE PALSY: Update: Stable.  He was briefly put on Xarelto, which was discontinued due to dark stool and bleeding gums.  He is back on ASA.   History: He developed vertical diplopia over a year ago, particularly noticeable when he was driving.  It has definitely improved and now only notices it when he has his head tilted back and is looking down.  He saw ophthalmologist, Dr. Jola Schmidt, who diagnosed a fourth nerve palsy.  MRI of the brain and orbits with and without contrast from 02/01/16 showed a focal area of remote hemorrhage just inferior to the third nerve nucleus and near the fourth nerve nucleus.  He reportedly had fluctuations in blood pressure with systolics in the high 650P.   HEADACHE: Update: Headaches are infrequent.  He cannot quantify. Current NSAID:  Celebrex (for back pain, ineffective) Current analgesic:  Tramadol (once in awhile for back pain, ineffective) Current antihypertensive:  Norvasc, Lisinopril, clonidine Current antidepressant:  nortriptyline 25m Current anticonvulsant:  no Current antipsychotic:  Zyprexa   History: He has history of cluster headaches for over 20 years as well as other headaches for many years.   His cluster headaches are described as involving the right eye, 10/10 intensity, sharp stabbing pain, lasting one hour.  It would occur between 1:30 and 2 am in the morning.  He was previously treated with verapamil, which caused constipation.  He had used O2 and nasal sprays.  For many years, he has been taking Zyprexa.  He takes it as needed, only when he feels he has a cluster headache.  His last cluster headache was  in 2012.   He also has other type of headache.  It is bi-frontal, non-throbbing ache, about 6-7/10.  It is not associated with other symptoms such as nausea or photophobia.  It can last all day and typically occurs 4 times a month.  Often, he wakes up with it.  Sometimes, it would lead into a cluster, so he has taken Zyprexa recently for it, but it has not helped.  He was advised to start taking the Zyprexa daily.  He takes ASA or ibuprofen sparingly.  They only take the edge off.  Drinking two cups of coffee treats it well.  He drinks coffee daily.     DIZZINESS/PRESYNCOPE: Update: He had increased dizziness after increase in donepezil.  We discontinued it to see if symptoms improved.  He restarted the donepezil at 576mand it has not recurred. History: He has had episodes of feeling "woozy" for several years.  One time it occurred after a 3 mile walk and another time it occurred when he stood up while walking in his garden.  He has not lost consciousness.  There is no spinning sensation but he says that the "room swims".  MRI of the brain and MRA of head from 11/05/12 were unremarkable.  MRA of neck showed no stenosis except for some smooth plaque at the origin of the right ICA.  Orthostatic testing was reportedly negative.     GAIT INSTABILITY AND CHRONIC BACK PAIN: Update: He sits at choir.  He believes his balance problems are due to the back pain.  He  goes to PT.  Recent MRI of lumbar spine is reportedly unchanged.  There is nothing surgical as per Dr. Vertell Limber.  He receives epidural injections by Dr. Maryjean Ka, which are only modestly effective.  Tramadol and Celebrex are ineffective.     History: He was also evaluated for balance issues.  This has been ongoing for 2-3 years.  He denies numbness in the feet or weakness in the legs.  He tore his right achilles heel at that time and had right foot weakness, which improved.  NCV-EMG from 03/17/13 showed evidence of active and chronic right L5  radiculopathy but no evidence of polyneuropathy.  MRI of lumbar spine from 03/20/13 showed multi-level disc bulging and facet hypertrophy but no spinal stenosis or significant foraminal narrowing or cause for L5 radiculopathy.  Neuropathy labs were performed on 01/13/14, revealing small (0.3) M-spike. ANA, Sed Rate, RF, TSH, Hgb A1c, vitamin D 25-hydroxy, and B12 were unremarkable.   Repeat testing of SPEP/IFE from December again showed an M-Spike with abnormal IgA lambda, consistent with MGUS.  He was referred to Dr. Alvy Bimler of Heme/Onc.  It was recommended to monitor for now. He continues to have non-radiating back pain with numbness in the legs with prolonged standing, unchanged.  A NCV-EMG performed on 01/14/15 showed chronic right L3-L5 radiculopathy but no evidence of generalized sensorimotor polyneuropathy.  To further evaluate gait instability, an MRI of the cervical spine was performed on 01/27/15, which did not reveal spinal stenosis.   MEMORY PROBLEMS: Update: Stable.  He drives and pays his bills without any problems.  Mood is good.  He takes Aricept 55m daily.  Aricept was briefly discontinued due to headache but it was restarted because it seems unrelated.   History: He has noticed some memory difficulties for about 2 years.  Specifically, he notes some problems recalling names of casual acquaintances (people he has only met maybe a couple of times).  Sometimes, when he is driving on familiar routes to places he frequents, he needs to take a moment to re-orient himself.  This occurs with places such as restaurants, but not places he goes to often, such as the grocery store.  He denies problems with word-finding or remembering to take medication.  He manages his finances without difficulty.  He does not repeat questions.  He does not exhibit REM sleep behavior disorder.  He has not had any personality or behavioral changes.  He denies hallucinations.  There is no known family history of dementia or  cognitive impairment.  He has a MBrewing technologist    PAST MEDICAL HISTORY: Past Medical History:  Diagnosis Date  . Back pain   . Bradycardia   . [place]   . [place]   . [place]   . Dyspnea   . GERD (gastroesophageal reflux disease)   . MGUS (monoclonal gammopathy of unknown significance) 01/25/2015  . Migraine   . Pneumothorax 01/28/2013  . PVC (premature ventricular contraction)   . Syncope    s/p Medtronic ILR implant 05/2013 by Dr TLovena Le   MEDICATIONS: Current Outpatient Prescriptions on File Prior to Visit  Medication Sig Dispense Refill  . amLODipine (NORVASC) 2.5 MG tablet Take 1 tablet (2.5 mg total) by mouth daily. 30 tablet 6  . aspirin 325 MG tablet Take 325 mg by mouth as needed for headache.    .Marland Kitchenaspirin EC 81 MG tablet Take 81 mg by mouth daily.    .Marland Kitchen  celecoxib (CELEBREX) 50 MG capsule Take 50 mg by mouth daily.    . cholecalciferol (VITAMIN D) 1000 UNITS tablet Take 2,000 Units by mouth daily.     . cloNIDine (CATAPRES) 0.1 MG tablet Take 1 tablet (0.1 mg total) by mouth daily. As needed for systolic blood pressure > 160 mmHg 30 tablet 2  . lisinopril (PRINIVIL,ZESTRIL) 40 MG tablet Take 1 tablet (40 mg total) by mouth daily. 90 tablet 3  . Multiple Vitamins-Minerals (MULTIVITAMIN WITH MINERALS) tablet Take 1 tablet by mouth daily.    . nortriptyline (PAMELOR) 25 MG capsule Take 3 capsules (75 mg total) by mouth at bedtime. 270 capsule 1  . OLANZapine (ZYPREXA) 10 MG tablet Take 10 mg by mouth at bedtime as needed (as needed to ward off  cluster headaches.).     Marland Kitchen sildenafil (VIAGRA) 100 MG tablet Take 100 mg by mouth daily as needed for erectile dysfunction.    . valACYclovir (VALTREX) 500 MG tablet Take 500 mg by mouth daily.     No current facility-administered medications on file prior to visit.     ALLERGIES: Allergies  Allergen Reactions  . Sulfa Antibiotics Hives      Hives     FAMILY HISTORY: Family History  Problem Relation Age of Onset  . Breast cancer Mother   . Uterine cancer Mother   . Rheum arthritis Son     SOCIAL HISTORY: Social History   Social History  . Marital status: Married    Spouse name: [name]  . Number of children: 2  . Years of education: MA   Occupational History  . retired Chief Financial Officer    Social History Main Topics  . Smoking status: Former Smoker    Packs/day: 1.50    Years: 10.00    Types: Cigarettes    Quit date: 12/04/1960  . Smokeless tobacco: Never Used  . Alcohol use 6.0 oz/week    10 Shots of liquor per week     Comment: 4 Drinks a week  . Drug use: No  . Sexual activity: Not on file   Other Topics Concern  . Not on file   Social History Narrative      Pt lives at home with his spouse.   Caffeine Use: 2 cups daily.    REVIEW OF SYSTEMS: Constitutional: No fevers, chills, or sweats, no generalized fatigue, change in appetite Eyes: No visual changes, double vision, eye pain Ear, nose and throat: No hearing loss, ear pain, nasal congestion, sore throat Cardiovascular: No chest pain, palpitations Respiratory:  No shortness of breath at rest or with exertion, wheezes GastrointestinaI: No nausea, vomiting, diarrhea, abdominal pain, fecal incontinence Genitourinary:  No dysuria, urinary retention or frequency Musculoskeletal:  back pain Integumentary: No rash, pruritus, skin lesions Neurological: as above Psychiatric: No depression, insomnia, anxiety Endocrine: No palpitations, fatigue, diaphoresis, mood swings, change in appetite, change in weight, increased thirst Hematologic/Lymphatic:  No purpura, petechiae. Allergic/Immunologic: no itchy/runny eyes, nasal congestion, recent allergic reactions, rashes  PHYSICAL EXAM: Vitals:   02/09/17 0736  BP: 120/80  Pulse: 80   General: No acute distress.  Patient appears well-groomed.  normal body habitus. Head:  Normocephalic/atraumatic Eyes:   Fundi examined but not visualized Neck: supple, no paraspinal tenderness, full range of motion Back: bilateral paraspinal tenderness Neurological Exam: alert and oriented to person, place, and time (date off by one day).  Attention span and concentration intact, delayed recall poor remote memory intact, fund of knowledge intact.  Speech fluent and not dysarthric,  language intact.   MMSE - Mini Mental State Exam 02/09/2017  Orientation to time 4  Orientation to time comments Stated date was the 10th instead of the 9th  Orientation to Place 5  Registration 3  Attention/ Calculation 5  Recall 3  Language- name 2 objects 2  Language- repeat 1  Language- follow 3 step command 3  Language- read & follow direction 1  Write a sentence 1  Copy design 1  Total score 29   CN II-XII intact. Bulk and tone normal, muscle strength 5/5 throughout.  Sensation to light touch intact.  Decreased vibration sensation in right foot.  Deep tendon reflexes 2+ throughout.  Finger to nose and heel to shin testing intact.  Gait wide-based  IMPRESSION: Remote hemorrhagic stroke causing fourth nerve palsy, likely secondary to hypertension, resolved Episodic tension type headache MCI Gait instability, possibly related to chronic low back pain  PLAN: 1.  ASA and blood pressure control 2.  Nortriptyline 76m at bedtime 3.  Increase Aricept to 121mat bedtime 4.  Discuss other pain management options with Dr. HaMaryjean Ka5.  Follow up in 7 months.  AdMetta ClinesDO  CC: WeCari CarawayMD

## 2017-02-09 NOTE — Patient Instructions (Signed)
1.  Continue nortriptyline 75mg  at bedtime 2.  Increase donepezil to 10mg  at bedtime 3.  Discuss pain control with Dr. Maryjean Ka 4.  Follow up in 7 months.

## 2017-03-10 ENCOUNTER — Other Ambulatory Visit: Payer: Self-pay | Admitting: Neurology

## 2017-03-12 ENCOUNTER — Ambulatory Visit
Admission: RE | Admit: 2017-03-12 | Discharge: 2017-03-12 | Disposition: A | Discharge status: Home / Self Care | Payer: Medicare Other | Source: Ambulatory Visit | Attending: Neurosurgery | Admitting: Neurosurgery

## 2017-03-12 ENCOUNTER — Other Ambulatory Visit: Payer: Self-pay | Admitting: Neurosurgery

## 2017-03-12 DIAGNOSIS — M545 Low back pain: Secondary | ICD-10-CM

## 2017-05-01 ENCOUNTER — Telehealth: Payer: Self-pay | Admitting: *Deleted

## 2017-05-01 MED ORDER — DONEPEZIL HCL 10 MG PO TABS: 10.0000 mg | ORAL_TABLET | Freq: Every day | ORAL | 3 refills | Status: DC

## 2017-05-01 NOTE — Telephone Encounter (Signed)
90 day supply request of patients Aricept sent to [facility]

## 2017-05-03 ENCOUNTER — Telehealth: Payer: Self-pay

## 2017-05-03 MED ORDER — DONEPEZIL HCL 10 MG PO TABS: 10.0000 mg | ORAL_TABLET | Freq: Every day | ORAL | 2 refills | Status: DC

## 2017-05-03 NOTE — Telephone Encounter (Signed)
Sent 90 day supply of Donepezil.

## 2017-05-15 IMAGING — CT CT ABD-PELV W/ CM
2 of 5 series · 15 of 46 positions shown, 17 images · IV contrast (APPLIED)
Comparison: MRI lumbar spine September 29, 2016 and abdominal
radiograph November 22, 2016

CLINICAL DATA: LEFT lower quadrant pain with nausea and
constipation for 1 week. Assess for obstruction versus
diverticulitis versus constipation. History of prostate cancer,
monoclonal gammopathy.

EXAM:
CT ABDOMEN AND PELVIS WITH CONTRAST
TECHNIQUE: Multidetector CT imaging of the abdomen and pelvis was performed
using the standard protocol following bolus administration of
intravenous contrast.
CONTRAST:  100mL [id number] IOPAMIDOL ([id number]) INJECTION 61%

[Series 2: axial st · axial · 0.94mm/px · z∈[-291,+174]mm · 12 of 105 slices shown, 14 images]
[im 6/105  soft-tissue]
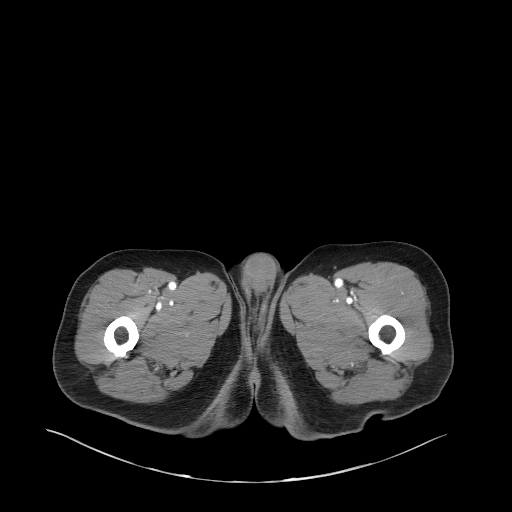
[im 6/105  bone]
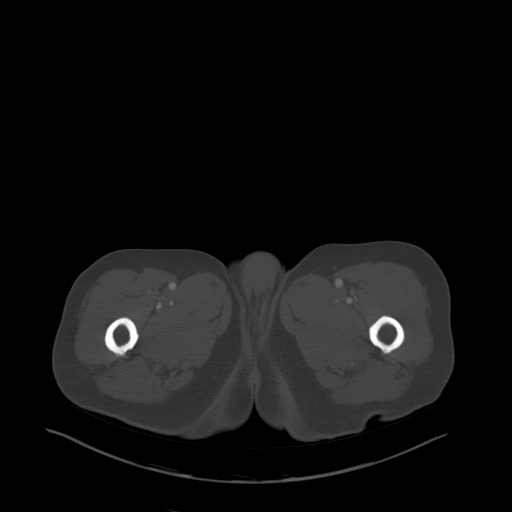
[im 18/105  soft-tissue]
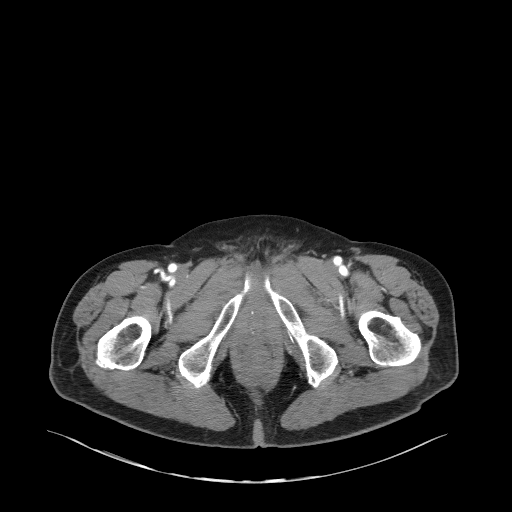
[im 24/105  soft-tissue]
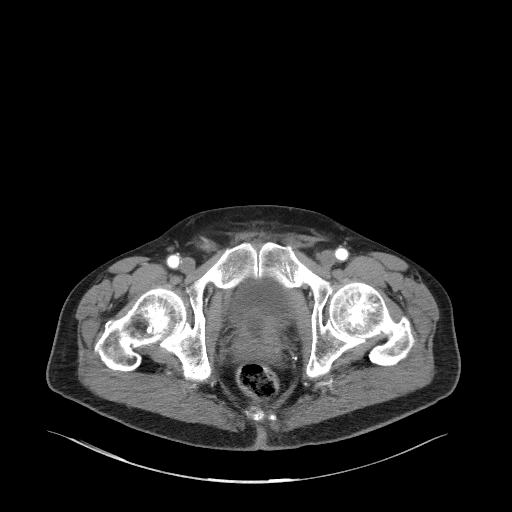
[im 29/105  soft-tissue]
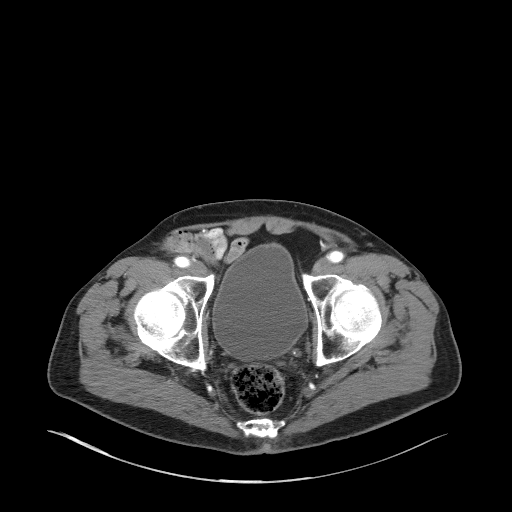
[im 41/105  soft-tissue]
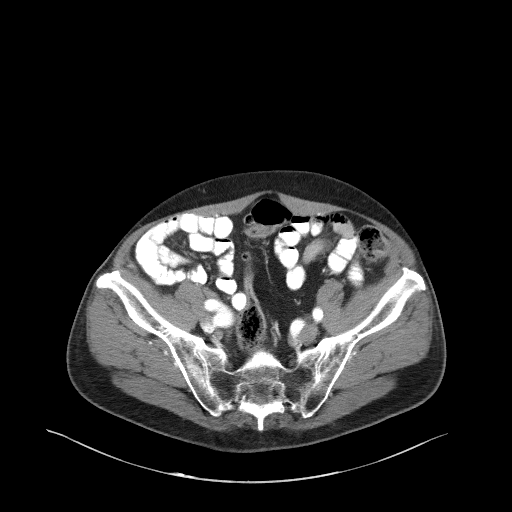
[im 47/105  soft-tissue]
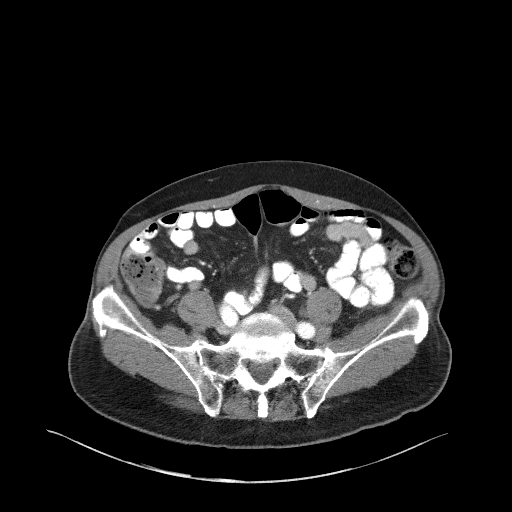
[im 58/105  soft-tissue]
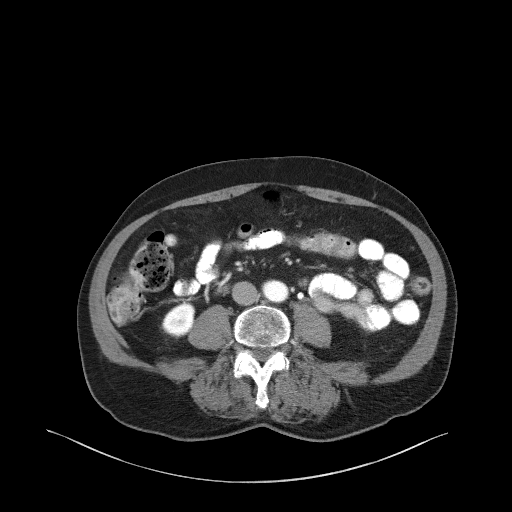
[im 64/105  soft-tissue]
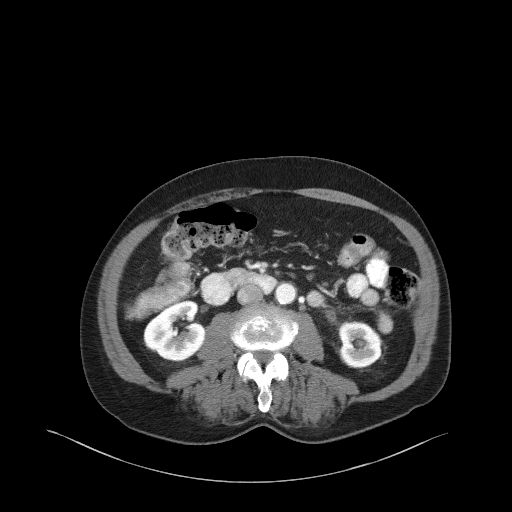
[im 76/105  soft-tissue]
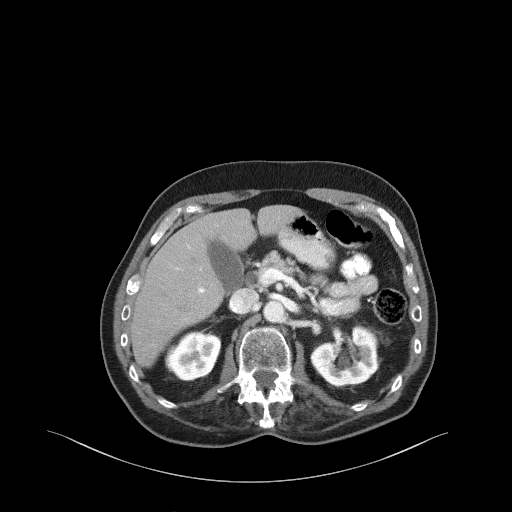
[im 76/105  bone]
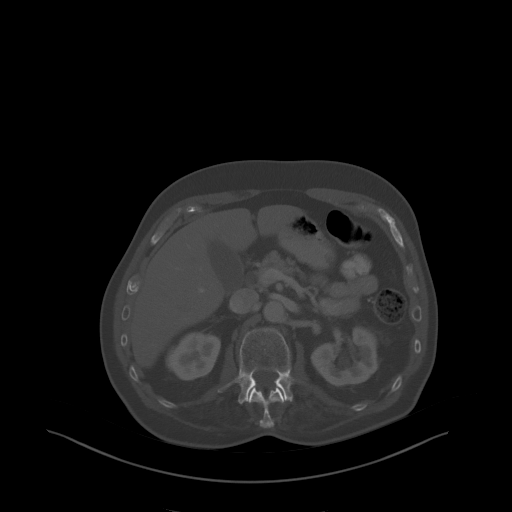
[im 81/105  soft-tissue]
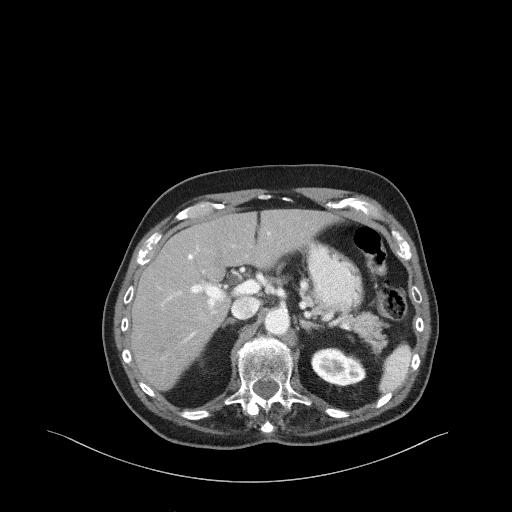
[im 87/105  soft-tissue]
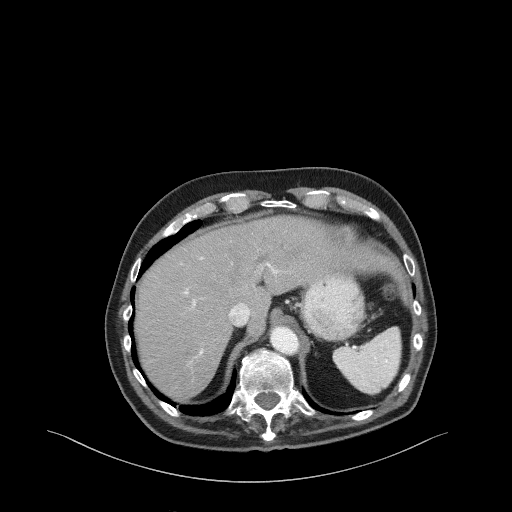
[im 99/105  soft-tissue]
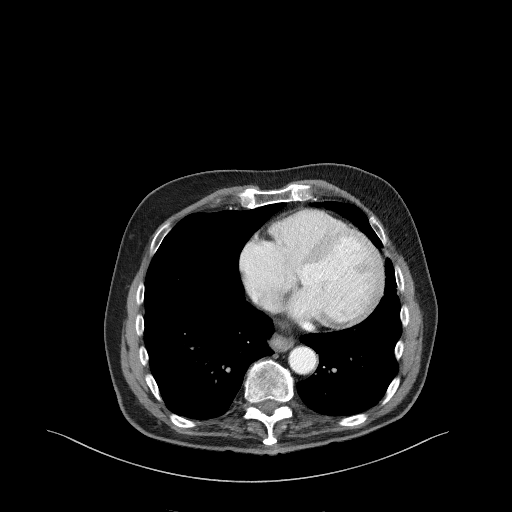

[Series 5: coronal st · coronal · 0.89mm/px · 3 of 84 slices shown]
[im 28/84  soft-tissue]
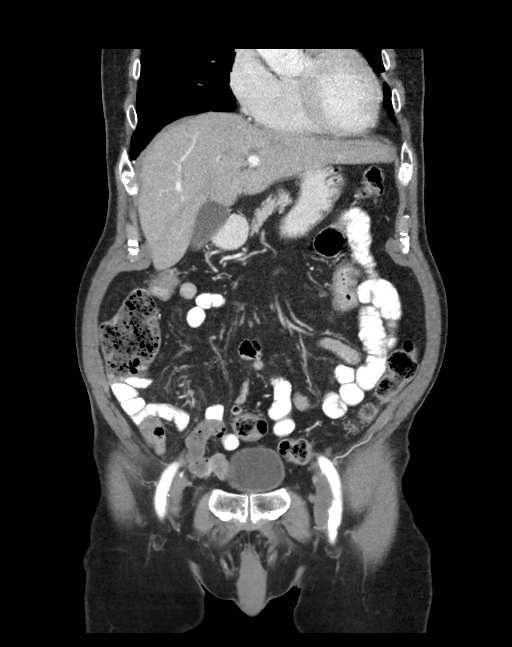
[im 37/84  soft-tissue]
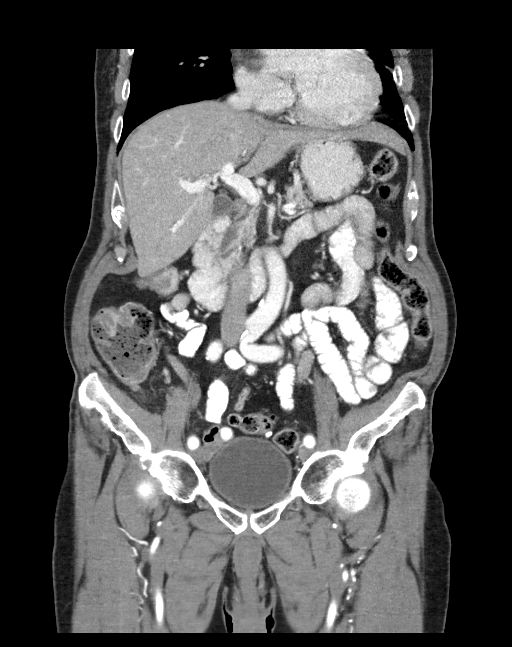
[im 47/84  soft-tissue]
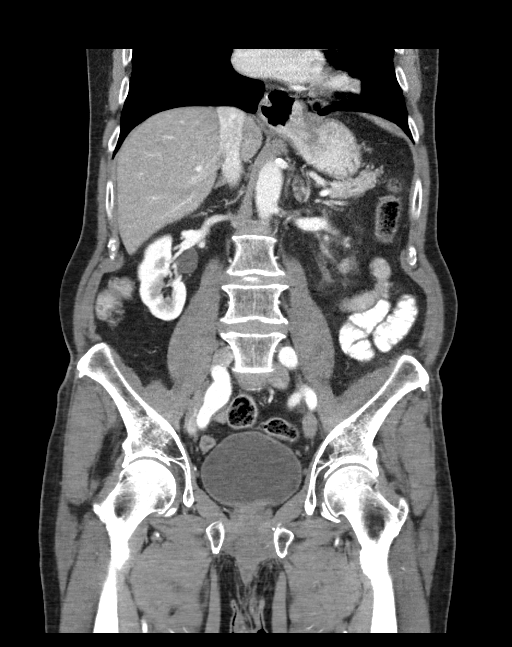

[15 of 46 positions shown; findings below may reference images not displayed]

FINDINGS: LOWER CHEST: Lung bases are clear. Included heart size is normal. No
pericardial effusion.

HEPATOBILIARY: Minimal intrahepatic biliary dilatation LEFT lobe.
Punctate calcified granulomas RIGHT lobe of the liver. Patent portal
vein. No hepatic mass. Gallbladder is normal.

PANCREAS: Normal.

SPLEEN: Normal.

ADRENALS/URINARY TRACT: Kidneys are orthotopic, demonstrating
symmetric enhancement. No nephrolithiasis, or solid renal masses.
Mild LEFT hydronephrosis. LEFT urothelial enhancement and
periureteral fat stranding. 2 mm distal LEFT ureteral calculus. Too
small to characterize hypodensities bilateral kidneys. Delayed
imaging through the kidneys demonstrates symmetric prompt contrast
excretion within the proximal urinary collecting system. Urinary
bladder is well distended, harboring no intravesicular calculi.
Normal adrenal glands.

STOMACH/BOWEL: Small hiatal hernia. The stomach, small and large
bowel are normal in course and caliber without inflammatory changes.
Moderate sigmoid diverticulosis. Mild amount of retained large bowel
stool. Normal appendix.

VASCULAR/LYMPHATIC: Aortoiliac vessels are normal in course and
caliber, mild calcific atherosclerosis. No lymphadenopathy by CT
size criteria.

REPRODUCTIVE: Mild prostatomegaly. Central hypodensity within the
prostate most consistent with TURP. Prostate invades the base the
urinary bladder.

OTHER: No intraperitoneal free fluid or free air.

MUSCULOSKELETAL: Nonacute. Old RIGHT rib fractures. Bilateral
sacroiliac ankylosis. Mild bilateral hip osteoarthrosis. Osteopenia.
Old mild to moderate T12 compression fracture.
IMPRESSION: 2 mm distal LEFT ureteral calculus results in mild obstructive
uropathy. Mild LEFT urothelial enhancement may be infectious or
inflammatory.

Sigmoid diverticulosis without acute diverticulitis or bowel
obstruction. Mild amount of retained large bowel stool.

## 2017-05-23 ENCOUNTER — Ambulatory Visit (INDEPENDENT_AMBULATORY_CARE_PROVIDER_SITE_OTHER): Payer: Medicare Other | Admitting: Cardiology

## 2017-05-23 ENCOUNTER — Telehealth: Payer: Self-pay | Admitting: Cardiology

## 2017-05-23 ENCOUNTER — Encounter: Payer: Self-pay | Admitting: Cardiology

## 2017-05-23 VITALS — BP 122/62 | HR 65 | Ht 70.0 in | Wt 177.0 lb

## 2017-05-23 DIAGNOSIS — I493 Ventricular premature depolarization: Secondary | ICD-10-CM | POA: Diagnosis not present

## 2017-05-23 DIAGNOSIS — R002 Palpitations: Secondary | ICD-10-CM

## 2017-05-23 DIAGNOSIS — R5383 Other fatigue: Secondary | ICD-10-CM | POA: Diagnosis not present

## 2017-05-23 NOTE — Patient Instructions (Signed)
Medication Instructions:  None  Labwork: CBC, BMET, Magnesium and TSH today  Testing/Procedures: Your physician has recommended that you wear a 48 hour holter monitor. Holter monitors are medical devices that record the heart's electrical activity. Doctors most often use these monitors to diagnose arrhythmias. Arrhythmias are problems with the speed or rhythm of the heartbeat. The monitor is a small, portable device. You can wear one while you do your normal daily activities. This is usually used to diagnose what is causing palpitations/syncope (passing out).    Follow-Up: Your physician recommends that you schedule a follow-up appointment in: 1-2 weeks with Dr. [name], Lyda Jester, PA-C or another APP.  Please make sure this is at least a few days after monitor is complete.    Any Other Special Instructions Will Be Listed Below (If Applicable).     If you need a refill on your cardiac medications before your next appointment, please call your pharmacy.

## 2017-05-23 NOTE — Progress Notes (Signed)
05/23/2017 Brian Hoover   [birth year]  [id number]  Primary Physician [name], MD Primary Cardiologist: Dr. [name] Dr. [name]    Reason for Visit/CC: Same Day Work-in for Palpitations/irregular pulse, H/o PAF and PVCs  HPI:  Brian Hoover is a 83 y.o. male who is being seen today for the evaluation of palpitations/irregular pulse. He has been followed by Dr. [name] and Dr. [name]. His last OV was 12/15/16. 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 He is s/p ILR insertion but he has run out of battery. He was noted to have possible atrial fib when we checked his ILR remotely. Dr. [name] started him on Xarelto, however he bled immediately (melena and spontaneous gum bleeding). Xarelto was discontinued and he has since been on daily ASA, 81 mg.   He called the office today complaining of irregular pulse rate. He started checking his pulse earlier today given that he has felt poorly for the last 2-3 days with weakness and fatigue. He denies fever, chills, n/v/d. No CP, dyspnea, syncope/ near syncope. He also denies melena. His pulse has felt irregular. EKG currently shows NSR with 1st degree AVB. HR is 65 bpm.     Current Meds  Medication Sig  . amLODipine (NORVASC) 2.5 MG tablet Take 1 tablet (2.5 mg total) by mouth daily.  Marland Kitchen aspirin 325 MG tablet Take 325 mg by mouth as needed for headache.  Marland Kitchen aspirin EC 81 MG tablet Take 81 mg by mouth daily.  . cholecalciferol (VITAMIN D) 1000 UNITS tablet Take 2,000 Units by mouth daily.   . cloNIDine (CATAPRES) 0.1 MG tablet Take 1 tablet (0.1 mg total) by mouth daily. As needed for systolic blood pressure > 160 mmHg  . donepezil (ARICEPT) 10 MG tablet Take 1 tablet (10 mg total) by mouth at bedtime.  Marland Kitchen lisinopril (PRINIVIL,ZESTRIL) 40 MG tablet Take 1 tablet (40 mg  total) by mouth daily.  . Multiple Vitamins-Minerals (MULTIVITAMIN WITH MINERALS) tablet Take 1 tablet by mouth daily.  . nortriptyline (PAMELOR) 25 MG capsule Take 3 capsules (75 mg total) by mouth at bedtime.  Marland Kitchen OLANZapine (ZYPREXA) 10 MG tablet Take 10 mg by mouth at bedtime as needed (as needed to ward off  cluster headaches.).   Marland Kitchen sildenafil (VIAGRA) 100 MG tablet Take 100 mg by mouth daily as needed for erectile dysfunction.  . valACYclovir (VALTREX) 500 MG tablet Take 500 mg by mouth daily.  . [DISCONTINUED] celecoxib (CELEBREX) 50 MG capsule Take 50 mg by mouth daily.   Allergies  Allergen Reactions  . Sulfa Antibiotics Hives    Hives    Past Medical History:  Diagnosis Date  . Back pain   . Bradycardia   . CHF (congestive heart failure) ([place])   . Chronic systolic heart failure ([place])   . COPD (chronic obstructive pulmonary disease) ([place])   . Dyspnea   . GERD (gastroesophageal reflux disease)   . MGUS (monoclonal gammopathy of unknown significance) 01/25/2015  . Migraine   . Pneumothorax 01/28/2013  . PVC (premature ventricular contraction)   . Syncope    s/p Medtronic ILR implant 05/2013 by Dr [name]   Family History  Problem Relation Age of Onset  . Breast cancer Mother   . Uterine cancer Mother   . Rheum arthritis Son    Past Surgical  History:  Procedure Laterality Date  . CHEST TUBE INSERTION Right 01/29/2013  . CLAVICLE SURGERY    . LOOP RECORDER IMPLANT  06/02/2013   Medtronic LinQ implanted for syncope by Dr [name]  . LOOP RECORDER IMPLANT N/A 06/02/2013   Procedure: LOOP RECORDER IMPLANT;  Surgeon: [name], MD;  Location: [facility] CATH LAB;  Service: Cardiovascular;  Laterality: N/A;  . PROSTATE SURGERY    . SHOULDER ARTHROSCOPY    . TENDON REPAIR     right foot   Social History   Social History  . Marital status: Married    Spouse name: [name]  . Number of children: 2  . Years of education: MA   Occupational History  . retired Chief Financial Officer    Social  History Main Topics  . Smoking status: Former Smoker    Packs/day: 1.50    Years: 10.00    Types: Cigarettes    Quit date: 12/04/1960  . Smokeless tobacco: Never Used  . Alcohol use 6.0 oz/week    10 Shots of liquor per week     Comment: 4 Drinks a week  . Drug use: No  . Sexual activity: Not on file   Other Topics Concern  . Not on file   Social History Narrative      Pt lives at home with his spouse.   Caffeine Use: 2 cups daily.     Review of Systems: General: negative for chills, fever, night sweats or weight changes.  Cardiovascular: negative for chest pain, dyspnea on exertion, edema, orthopnea, palpitations, paroxysmal nocturnal dyspnea or shortness of breath Dermatological: negative for rash Respiratory: negative for cough or wheezing Urologic: negative for hematuria Abdominal: negative for nausea, vomiting, diarrhea, bright red blood per rectum, melena, or hematemesis Neurologic: negative for visual changes, syncope, or dizziness All other systems reviewed and are otherwise negative except as noted above.   Physical Exam:  Blood pressure 122/62, pulse 65, height 5\' 10"  (1.778 m), weight 177 lb (80.3 kg).  General appearance: alert, cooperative and no distress Neck: no carotid bruit and no JVD Lungs: clear to auscultation bilaterally Heart: regular rate and rhythm and w/ pause every 4-5 beat (sounds like PVCs) Extremities: extremities normal, atraumatic, no cyanosis or edema Pulses: 2+ and symmetric Skin: Skin color, texture, turgor normal. No rashes or lesions Neurologic: Grossly normal  EKG NSR w/ 1st degree AVB -- personally reviewed   ASSESSMENT AND PLAN:   1. Irregular Pulse Rate: pt with h/o PVCs and PAF but not on a/c given prior bleeding with Xarelto. He has felt poorly for the last several days with mild weakness and fatigue. No CP, dyspnea, syncope/ near syncope. EKG today shows NSR with 1st degree AV block. HR 65 bpm. Exam is notable for RRR with pause  every 4-5 beats which sounds more like PVCs. We will obtain basic labs, CBC, BMP, Mg and TSH to see if any reversible causes  and will arrange a 48 hr monitor to further assess. If he is having recurrent PAF, then we will need to readdress a/c for stroke reduction. Can consider Eliquis, which has a lower bleed risk profile, w/o ASA (pt reports he was taking ASA with Xarelto, but no CAD history). May also consider watchman's device if afib detected. F/u with APP after monitor.    Margree Gimbel Ladoris Gene, MHS [facility] 05/23/2017 3:52 PM

## 2017-05-23 NOTE — Telephone Encounter (Signed)
New message    Patient calling C/O PVC  No other issues at the point

## 2017-05-23 NOTE — Telephone Encounter (Signed)
Spoke with patient who states pulse is irregular today. He states he has placed his fingers on his wrist and felt his pulse which felt irregular. He states he would like to have this evaluated since it has not occurred frequently. Patient was last seen on 1/12 by Dr. [name] and was told he could be seen PRN. Patient requests an appointment. I reviewed medications and he verbalized compliance. He is not currently taking a DOAC or a rate controller. I offered him an appointment at 3:00 pm today with Ellen Henri, PA and he agreed. He thanked me for the call.

## 2017-05-24 ENCOUNTER — Ambulatory Visit (INDEPENDENT_AMBULATORY_CARE_PROVIDER_SITE_OTHER): Payer: Medicare Other

## 2017-05-24 ENCOUNTER — Other Ambulatory Visit: Payer: Self-pay | Admitting: *Deleted

## 2017-05-24 DIAGNOSIS — R002 Palpitations: Secondary | ICD-10-CM

## 2017-05-24 LAB — BASIC METABOLIC PANEL
BUN/Creatinine Ratio: 19 (ref 10–24)
BUN: 20 mg/dL (ref 8–27)
CALCIUM: 9.9 mg/dL (ref 8.6–10.2)
CO2: 23 mmol/L (ref 20–29)
Chloride: 97 mmol/L (ref 96–106)
Creatinine, Ser: 1.05 mg/dL (ref 0.76–1.27)
GFR calc non Af Amer: 65 mL/min/{1.73_m2} (ref >59)
GFR, EST AFRICAN AMERICAN: 76 mL/min/{1.73_m2} (ref >59)
Glucose: 91 mg/dL (ref 65–99)
Potassium: 4.6 mmol/L (ref 3.5–5.2)
Sodium: 138 mmol/L (ref 134–144)

## 2017-05-24 LAB — CBC
Hematocrit: 44 % (ref 37.5–51.0)
Hemoglobin: 15.3 g/dL (ref 13.0–17.7)
MCH: 31.8 pg (ref 26.6–33.0)
MCHC: 34.8 g/dL (ref 31.5–35.7)
MCV: 92 fL (ref 79–97)
PLATELETS: 271 10*3/uL (ref 150–379)
RBC: 4.81 x10E6/uL (ref 4.14–5.80)
RDW: 13.9 % (ref 12.3–15.4)
WBC: 6.7 10*3/uL (ref 3.4–10.8)

## 2017-05-24 LAB — TSH: TSH: 4.23 u[IU]/mL (ref 0.450–4.500)

## 2017-05-24 LAB — MAGNESIUM: Magnesium: 2.6 mg/dL — ABNORMAL HIGH (ref 1.6–2.3)

## 2017-05-24 MED ORDER — AMLODIPINE BESYLATE 2.5 MG PO TABS: 2.5000 mg | ORAL_TABLET | Freq: Every day | ORAL | 1 refills | Status: DC

## 2017-05-25 ENCOUNTER — Telehealth: Payer: Self-pay | Admitting: Cardiology

## 2017-05-25 ENCOUNTER — Encounter: Payer: Self-pay | Admitting: Nurse Practitioner

## 2017-05-25 NOTE — Telephone Encounter (Signed)
New message ° ° ° °Pt is returning call to Jennifer. °

## 2017-05-28 NOTE — Telephone Encounter (Signed)
New message    Pt is returning call from Friday about lab results. He said he is coming in later today and would like to pick up his results then.

## 2017-05-28 NOTE — Telephone Encounter (Signed)
Reviewed lab results with pt who reports he has been taking MG for constipation.  He will discontinue this.

## 2017-06-10 ENCOUNTER — Other Ambulatory Visit: Payer: Self-pay | Admitting: Neurology

## 2017-06-12 ENCOUNTER — Ambulatory Visit (INDEPENDENT_AMBULATORY_CARE_PROVIDER_SITE_OTHER): Payer: Medicare Other | Admitting: Nurse Practitioner

## 2017-06-12 ENCOUNTER — Encounter: Payer: Self-pay | Admitting: Nurse Practitioner

## 2017-06-12 VITALS — BP 128/92 | HR 70 | Ht 70.0 in | Wt 181.4 lb

## 2017-06-12 DIAGNOSIS — R5383 Other fatigue: Secondary | ICD-10-CM | POA: Diagnosis not present

## 2017-06-12 DIAGNOSIS — I480 Paroxysmal atrial fibrillation: Secondary | ICD-10-CM | POA: Diagnosis not present

## 2017-06-12 DIAGNOSIS — R002 Palpitations: Secondary | ICD-10-CM

## 2017-06-12 NOTE — Progress Notes (Signed)
CARDIOLOGY OFFICE NOTE  Date:  06/12/2017    Brian Hoover Date of Birth: 1932-10-01 Medical Record #[id number]  PCP:  [name], MD  Cardiologist:  [facility]    Chief Complaint  Patient presents with  . Palpitations    2 week check - seen for Dr. [name]    History of Present Illness: Brian Hoover is a 83 y.o. male who presents today for a 2 week check. Seen for Dr. [name].   He is a retired Chief Financial Officer with a h/o PVC induced LV dysfunction, s/p PVC ablation, h/o sinus bradycardia, h/o syncope, which was unexplained who fell and sustained rib fractures a couple of years ago. He is s/p ILR insertion but he has run out of battery. He was noted to have possible atrial fib when we checked his ILR remotely. Dr. [name] started him on Xarelto, however he bled immediately (melena and spontaneous gum bleeding). Xarelto was discontinued and he has since been on daily ASA, 81 mg.   Seen here 2 weeks ago with palpitations/irregular pulse.  He had started checking his pulse earlier that day given that he had felt poorly for 2-3 days prior with weakness and fatigue. His pulse has felt irregular. EKG currently showed NSR with 1st degree AVB. HR was 65 bpm. A 48 hour Holter was placed. His labs were all ok. He was asked to come back for follow up.   Comes in today. Here with his wife. He does not feel good overall. Has lots of back pain which seems to be his biggest issue. Lots of skips. Did not feel great while he was wearing the monitor - wife noted fatigue and lethargy. No recent syncope. Wife notes that he is short of breath "a lot" and fatigued. He is quite sedentary - due to his back. No active chest pain.   Past Medical History:  Diagnosis Date  . Back pain   . Bradycardia   . CHF (congestive heart failure) ([place])   . Chronic systolic heart failure ([place])   . COPD (chronic obstructive pulmonary disease) ([place])   . Dyspnea   . GERD (gastroesophageal reflux disease)   . MGUS (monoclonal  gammopathy of unknown significance) 01/25/2015  . Migraine   . Pneumothorax 01/28/2013  . PVC (premature ventricular contraction)   . Syncope    s/p Medtronic ILR implant 05/2013 by Dr [name]    Past Surgical History:  Procedure Laterality Date  . CHEST TUBE INSERTION Right 01/29/2013  . CLAVICLE SURGERY    . LOOP RECORDER IMPLANT  06/02/2013   Medtronic LinQ implanted for syncope by Dr [name]  . LOOP RECORDER IMPLANT N/A 06/02/2013   Procedure: LOOP RECORDER IMPLANT;  Surgeon: [name], MD;  Location: [facility] CATH LAB;  Service: Cardiovascular;  Laterality: N/A;  . PROSTATE SURGERY    . SHOULDER ARTHROSCOPY    . TENDON REPAIR     right foot     Medications: Current Meds  Medication Sig  . amLODipine (NORVASC) 2.5 MG tablet Take 1 tablet (2.5 mg total) by mouth daily.  Marland Kitchen aspirin 325 MG tablet Take 325 mg by mouth as needed for headache.  Marland Kitchen aspirin EC 81 MG tablet Take 81 mg by mouth daily.  . cholecalciferol (VITAMIN D) 1000 UNITS tablet Take 2,000 Units by mouth daily.   . cloNIDine (CATAPRES) 0.1 MG tablet Take 1 tablet (0.1 mg total) by mouth daily. As needed for systolic blood pressure > 160 mmHg  . donepezil (ARICEPT)  10 MG tablet Take 1 tablet (10 mg total) by mouth at bedtime.  Marland Kitchen lisinopril (PRINIVIL,ZESTRIL) 40 MG tablet Take 1 tablet (40 mg total) by mouth daily.  . Multiple Vitamins-Minerals (MULTIVITAMIN WITH MINERALS) tablet Take 1 tablet by mouth daily.  . nortriptyline (PAMELOR) 25 MG capsule TAKE 3 CAPSULES BY MOUTH AT BEDTIME  . OLANZapine (ZYPREXA) 10 MG tablet Take 10 mg by mouth at bedtime as needed (as needed to ward off  cluster headaches.).   Marland Kitchen sildenafil (VIAGRA) 100 MG tablet Take 100 mg by mouth daily as needed for erectile dysfunction.  . valACYclovir (VALTREX) 500 MG tablet Take 500 mg by mouth daily.     Allergies: Allergies  Allergen Reactions  . Sulfa Antibiotics Hives    Hives     Social History: The patient  reports that he quit smoking  about 56 years ago. His smoking use included Cigarettes. He has a 15.00 pack-year smoking history. He has never used smokeless tobacco. He reports that he drinks about 6.0 oz of alcohol per week . He reports that he does not use drugs.   Family History: The patient's family history includes Breast cancer in his mother; Rheum arthritis in his son; Uterine cancer in his mother.   Review of Systems: Please see the history of present illness.   Otherwise, the review of systems is positive for none.   All other systems are reviewed and negative.   Physical Exam: VS:  BP (!) 128/92 (BP Location: Left Arm, Patient Position: Sitting, Cuff Size: Normal)   Pulse 70   Ht 5\' 10"  (1.778 m)   Wt 181 lb 6.4 oz (82.3 kg)   SpO2 98% Comment: at rest  BMI 26.03 kg/m  .  BMI Body mass index is 26.03 kg/m.  Wt Readings from Last 3 Encounters:  06/12/17 181 lb 6.4 oz (82.3 kg)  05/23/17 177 lb (80.3 kg)  02/09/17 178 lb 7 oz (80.9 kg)    General: Pleasant. Elderly male who is alert and in no acute distress.   HEENT: Normal.  Neck: Supple, no JVD, carotid bruits, or masses noted.  Cardiac: Regular rate and rhythm. Occasional ectopic noted. Soft murmur noted. No edema.  Respiratory:  Lungs are clear to auscultation bilaterally with normal work of breathing.  GI: Soft and nontender.  MS: No deformity or atrophy. Gait and ROM intact.  Skin: Warm and dry. Color is normal.  Neuro:  Strength and sensation are intact and no gross focal deficits noted.  Psych: Alert, appropriate and with normal affect.   LABORATORY DATA:  EKG:  EKG is not ordered today.  Lab Results  Component Value Date   WBC 6.7 05/23/2017   HGB 15.3 05/23/2017   HCT 44.0 05/23/2017   PLT 271 05/23/2017   GLUCOSE 91 05/23/2017   ALT 19 11/22/2016   AST 21 11/22/2016   NA 138 05/23/2017   K 4.6 05/23/2017   CL 97 05/23/2017   CREATININE 1.05 05/23/2017   BUN 20 05/23/2017   CO2 23 05/23/2017   TSH 4.230 05/23/2017   INR  0.92 01/29/2013   HGBA1C 5.6 01/13/2014     BNP (last 3 results) No results for input(s): BNP in the last 8760 hours.  ProBNP (last 3 results) No results for input(s): PROBNP in the last 8760 hours.   Other Studies Reviewed Today:  Echo Study Conclusions from 03/2016  - Left ventricle: The cavity size was normal. Wall thickness was   normal. Systolic function was mildly  to moderately reduced. The   estimated ejection fraction was in the range of 40% to 45%. There   is akinesis of the inferoseptal myocardium. Doppler parameters   are consistent with abnormal left ventricular relaxation (grade 1   diastolic dysfunction). - Aortic valve: There was trivial regurgitation. - Left atrium: The atrium was moderately dilated. - Right ventricle: The cavity size was mildly dilated. Wall   thickness was normal. - Pulmonic valve: There was moderate regurgitation.  Impressions:  - Compared to the prior study, there has been no significant   interval change  Assessment/Plan:  1. Prior palpitations - with history of PVCs and PAF - not on anticoagulation due to prior bleeding other than aspirin. His Holter is reviewed with Dr. [name] here in the office today - noted nocturnal bradycardia, PVCs, NSVT - no AF - no indication for pacemaker at this time. Dr. [name] has advised getting his echo updated and have him come back and see both he and Dr. [name] for further discussion. I get the feeling that most of his symptoms are related to deconditioning - discussed at length.   2. Underlying loop recorder - battery is at end of life.   3. Prior bleeding with Xarelto in the setting of also being on aspirin   Current medicines are reviewed with the patient today.  The patient does not have concerns regarding medicines other than what has been noted above.  The following changes have been made:  See above.  Labs/ tests ordered today include:    Orders Placed This Encounter  Procedures  .  ECHOCARDIOGRAM COMPLETE     Disposition:   FU with Dr. [name] and Dr. [name] after echo is updated - for now no change in medicines.   Patient is agreeable to this plan and will call if any problems develop in the interim.   SignedTruitt Merle, NP  06/12/2017 8:24 AM  [facility] [address] [place], [place]  [address] Phone: [phone] Fax: [phone]

## 2017-06-12 NOTE — Patient Instructions (Addendum)
We will be checking the following labs today - NONE   Medication Instructions:    Continue with your current medicines.     Testing/Procedures To Be Arranged:  Echocardiogram  Follow-Up:   See Dr. [name] and Dr. [name]    Other Special Instructions:   Think about what we talked about today    If you need a refill on your cardiac medications before your next appointment, please call your pharmacy.   Call the [place] office at [phone] if you have any questions, problems or concerns.

## 2017-06-14 ENCOUNTER — Ambulatory Visit (HOSPITAL_COMMUNITY): Payer: Medicare Other | Attending: Cardiology

## 2017-06-14 ENCOUNTER — Other Ambulatory Visit: Payer: Self-pay

## 2017-06-14 DIAGNOSIS — R5383 Other fatigue: Secondary | ICD-10-CM | POA: Diagnosis present

## 2017-06-14 DIAGNOSIS — R002 Palpitations: Secondary | ICD-10-CM | POA: Insufficient documentation

## 2017-06-14 DIAGNOSIS — R29898 Other symptoms and signs involving the musculoskeletal system: Secondary | ICD-10-CM | POA: Insufficient documentation

## 2017-06-14 DIAGNOSIS — I253 Aneurysm of heart: Secondary | ICD-10-CM | POA: Insufficient documentation

## 2017-06-14 DIAGNOSIS — I480 Paroxysmal atrial fibrillation: Secondary | ICD-10-CM | POA: Diagnosis not present

## 2017-06-14 DIAGNOSIS — I080 Rheumatic disorders of both mitral and aortic valves: Secondary | ICD-10-CM | POA: Insufficient documentation

## 2017-06-14 MED ORDER — PERFLUTREN LIPID MICROSPHERE
1.0000 mL | INTRAVENOUS | Status: AC | PRN
  Administered 2017-06-14: 2 mL via INTRAVENOUS

## 2017-06-18 ENCOUNTER — Encounter: Payer: Self-pay | Admitting: Cardiology

## 2017-06-18 ENCOUNTER — Ambulatory Visit (INDEPENDENT_AMBULATORY_CARE_PROVIDER_SITE_OTHER): Payer: Medicare Other | Admitting: Cardiology

## 2017-06-18 VITALS — BP 122/80 | HR 72 | Ht 70.0 in | Wt 178.0 lb

## 2017-06-18 DIAGNOSIS — I472 Ventricular tachycardia: Secondary | ICD-10-CM

## 2017-06-18 DIAGNOSIS — I480 Paroxysmal atrial fibrillation: Secondary | ICD-10-CM

## 2017-06-18 DIAGNOSIS — I4729 Other ventricular tachycardia: Secondary | ICD-10-CM

## 2017-06-18 DIAGNOSIS — I493 Ventricular premature depolarization: Secondary | ICD-10-CM

## 2017-06-18 DIAGNOSIS — I5022 Chronic systolic (congestive) heart failure: Secondary | ICD-10-CM | POA: Diagnosis not present

## 2017-06-18 MED ORDER — CLONIDINE HCL 0.1 MG PO TABS: 0.1000 mg | ORAL_TABLET | Freq: Every day | ORAL | 2 refills | Status: DC

## 2017-06-18 MED ORDER — AMLODIPINE BESYLATE 2.5 MG PO TABS: 2.5000 mg | ORAL_TABLET | Freq: Every day | ORAL | 3 refills | Status: DC

## 2017-06-18 NOTE — Patient Instructions (Signed)

## 2017-06-18 NOTE — Progress Notes (Signed)
[facility]. [address], [place]  [address] Phone: [phone] Fax:  [phone]  Date:  06/18/2017   ID:  Brian Hoover, DOB 12/23/1931, MRN [id number]  PCP:  [name], MD   History of Present Illness: Brian Hoover is a 83 y.o. male here for follow up frequent PVCs, paroxysmal atrial fibrillation, hypertension, fourth nerve palsy resolved, remote midbrain hemorrhage, disequilibrium, tinnitus, neurologist Dr. Tomi Likens. Overall deconditioning.  He had a Holter monitor placed on 05/24/17 which demonstrated frequent PVCs, 6000 as well as PACs and potential run of nonsustained ventricular tachycardia at 170 bpm. Findings were discussed with Dr. Cristopher Peru by Truitt Merle, NP at the time.  He has an implantable loop recorder however the battery is no longer functional. Previously he was noted to have possible atrial fibrillation from this implantable loop. Dr. [name] started him on Xarelto at that time but then he bled with melanoma and gum bleeding. This was discontinued and he is now on daily aspirin. His most recent Holter monitor did not show any evidence of atrial fibrillation.  He saw Ellen Henri, PA on 05/23/17 when complaining of irregular pulse. EKG showed normal sinus rhythm with first-degree AV block. [place] in June is the one who ordered the 48 hour Holter monitor which was completed as above.  Overall he felt poor. Fatigue, lethargy, no recent syncope.  Echocardiogram last in April 2017 showed EF of 40-45% with inferoseptal wall akinesis. No change from prior study.  He was advised on 06/12/17 to get a repeat echocardiogram and does see Dr. [name] and myself for further discussion. Truitt Merle had the feeling that most of his symptoms were related to deconditioning.  Prior cardiac history includes frequent PVCs which were successfully ablated by Dr. [name] with resolution of symptoms. During those PVCs, he ended up having cardiomyopathy with  decreased ejection fraction. At last check, his EF was 45% in 2013.   Recently he has been battling hypertension and most recent medication has been clonidine to be taken on as-needed basis with systolic blood pressure greater than 160. When he took clonidine once for instance, his blood pressure dropped to the 90s and he did not feel well. Appreciate pharmacy assistance with hypertension clinic.  Blood pressure had been fairly labile from 120s to 170s. He was concerned  because of MRI results that showed remote hemorrhage in midbrain which is likely secondary from hypertension. He had fourth nerve palsy which was detected by his ophthalmologist and symptoms were double vision. This occurred in December 2016. Note he has not been on anticoagulation for his atrial fibrillation at his own volition.   Previously, was walking 3 miles into walk. Was dizzy, felt completely off-balance, did not lose consciousness. Collapsed, fell tore forehead. He tried to get up but ended up leaning on trash cans. Very concerning to him. He did not feel palpitations previously. He notes that he woke up a few months ago and he noticed that the room was spinning. He has had this off and on for several months. He notes that if he bends down and then gets up quickly, he will have some dizziness.  Event monitor placed a November 2013 did demonstrate short burst of nonsustained ventricular tachycardia. Because of this, he ended up seeing Dr. Crissie Sickles once again. Since he did not have any recurrence of episodes at that time, Dr. [name] endorsed implantable loop recorder. On interrogation showed no sustained arrhythmias or bradycardic episodes, however atrial  fibrillation was noted.  He has also seen neurology regarding this matter. Unfortunately, he sustained  fall previously at [facility] fractured ribs, pneumothorax. He slipped on wet shower floor. Clearly no syncope.      Wt Readings from Last 3 Encounters:  06/18/17 178 lb (80.7  kg)  06/12/17 181 lb 6.4 oz (82.3 kg)  05/23/17 177 lb (80.3 kg)     Past Medical History:  Diagnosis Date  . Back pain   . Bradycardia   . CHF (congestive heart failure) ([place])   . Chronic systolic heart failure ([place])   . COPD (chronic obstructive pulmonary disease) ([place])   . Dyspnea   . GERD (gastroesophageal reflux disease)   . MGUS (monoclonal gammopathy of unknown significance) 01/25/2015  . Migraine   . Pneumothorax 01/28/2013  . PVC (premature ventricular contraction)   . Syncope    s/p Medtronic ILR implant 05/2013 by Dr [name]    Past Surgical History:  Procedure Laterality Date  . CHEST TUBE INSERTION Right 01/29/2013  . CLAVICLE SURGERY    . LOOP RECORDER IMPLANT  06/02/2013   Medtronic LinQ implanted for syncope by Dr [name]  . LOOP RECORDER IMPLANT N/A 06/02/2013   Procedure: LOOP RECORDER IMPLANT;  Surgeon: [name], MD;  Location: [facility] CATH LAB;  Service: Cardiovascular;  Laterality: N/A;  . PROSTATE SURGERY    . SHOULDER ARTHROSCOPY    . TENDON REPAIR     right foot    Current Outpatient Prescriptions  Medication Sig Dispense Refill  . amLODipine (NORVASC) 2.5 MG tablet Take 1 tablet (2.5 mg total) by mouth daily. 90 tablet 3  . aspirin 325 MG tablet Take 325 mg by mouth as needed for headache.    Marland Kitchen aspirin EC 81 MG tablet Take 81 mg by mouth daily.    . cholecalciferol (VITAMIN D) 1000 UNITS tablet Take 2,000 Units by mouth daily.     . cloNIDine (CATAPRES) 0.1 MG tablet Take 1 tablet (0.1 mg total) by mouth daily. As needed for systolic blood pressure > 160 mmHg 30 tablet 2  . donepezil (ARICEPT) 10 MG tablet Take 1 tablet (10 mg total) by mouth at bedtime. 90 tablet 2  . lisinopril (PRINIVIL,ZESTRIL) 40 MG tablet Take 1 tablet (40 mg total) by mouth daily. 90 tablet 3  . Multiple Vitamins-Minerals (MULTIVITAMIN WITH MINERALS) tablet Take 1 tablet by mouth daily.    . nortriptyline (PAMELOR) 25 MG capsule TAKE 3 CAPSULES BY MOUTH AT BEDTIME 270 capsule 1    . OLANZapine (ZYPREXA) 10 MG tablet Take 10 mg by mouth at bedtime as needed (as needed to ward off  cluster headaches.).     Marland Kitchen sildenafil (VIAGRA) 100 MG tablet Take 100 mg by mouth daily as needed for erectile dysfunction.    . valACYclovir (VALTREX) 500 MG tablet Take 500 mg by mouth daily.     No current facility-administered medications for this visit.     Allergies:    Allergies  Allergen Reactions  . Sulfa Antibiotics Hives    Hives     Social History:  The patient  reports that he quit smoking about 56 years ago. His smoking use included Cigarettes. He has a 15.00 pack-year smoking history. He has never used smokeless tobacco. He reports that he drinks about 6.0 oz of alcohol per week . He reports that he does not use drugs.   ROS:  Please see the history of present illness.   Disequilibrium. No chest pain, no shortness of  breath, no further syncopal episodes    PHYSICAL EXAM: VS:  BP 122/80   Pulse 72   Ht 5\' 10"  (1.778 m)   Wt 178 lb (80.7 kg)   BMI 25.54 kg/m  Well nourished, well developed, in no acute distress , He walks hunched over from his chronic back pain. HEENT: normal  Neck: no JVD  Cardiac:  normal S1, S2; RRR; no murmur, No ectopy noted today.  Lungs:  clear to auscultation bilaterally, no wheezing, rhonchi or rales  Abd: soft, nontender, no hepatomegaly  Ext: no significant edema  Skin: warm and dry  Neuro: no focal abnormalities noted  EKG:  None today. Previous showed PVCs.   Echocardiogram 11/13-EF 45%, trace AI 06/14/17: - Procedure narrative: Transthoracic echocardiography. Image   quality was adequate. Intravenous contrast (Definity) was   administered. - Left ventricle: The cavity size was moderately dilated. Wall   thickness was normal. Systolic function was moderately reduced.   The estimated ejection fraction was in the range of 35% to 40%.   There is akinesis of the inferolateral myocardium. Doppler   parameters are consistent with  abnormal left ventricular   relaxation (grade 1 diastolic dysfunction). - Aortic valve: There was trivial regurgitation. - Aortic root: The aortic root was mildly dilated. - Mitral valve: Calcified annulus. There was mild regurgitation. - Left atrium: The atrium was mildly dilated. - Right ventricle: The cavity size was mildly dilated. - Atrial septum: There was an atrial septal aneurysm.  Impressions:  - Definity used; akinesis of the inferolateral wall with overall   moderately reduced LV function; mild diastolic dysfunction;   moderate LVE; trace AI; mildly dilated aortic root; mild MR; mild   LAE; mild RVE; mild TR.  ASSESSMENT AND PLAN:   Frequent PVCs/PACs/possible NSVT  - monitor reviewed with Dr. [name] via Truitt Merle. We will follow-up with him as well. He has had prior ablation of PVCs. His ejection fraction on repeat echocardiogram shows slightly decreased EF in the 35-40% range. Would be nice for him to be on beta blocker such as Toprol/carvedilol/bisoprolol but he has had bradycardia in the past. I'm unsure how to proceed given his excessive ectopy. Unsure what his options are currently. Await Dr. Tanna Furry review.  Nonetheless, cessation of alcohol, he used to drink 252 drinks a night to help with his back pain. Continue with exercise. He has not had any high risk symptoms such as syncope during exercise. His wife asked if sudden death was a possibility and certainly given his reduced ejection fraction and frequent ectopy, this is a risk for him. We discussed.           Paroxysmal atrial fibrillation-this was thought to be seen on prior implantable loop transmission however after starting Xarelto he had excessive melanoma and gum bleeding. Xarelto stopped, currently on aspirin only. His most recent Holter monitor 48 hours did not show any evidence of atrial fibrillation. If atrial fibrillation is present however he is at higher risk for stroke, he understood this and take  responsibility for this. I'm not sure if he would be a watchman candidate.  Disequilibrium- did not really discuss this today. Previously, neurology has evaluated. MRA/MRI of brain have been reassuring in the past however he has most recently had an MRI on 02/01/16 which showed small area of remote hemorrhage in midbrain responsible for fourth nerve palsy, double vision. He has tried physical therapy. No recent medication initiations. No tremors. Frustrating for him especially since he was quite vigorous,  skier et Ronney Asters.   Syncope-no further episodes. No adverse arrhythmias previously discovered on implantable loop. This is now exhausted of battery life.  Hypertension-currently improved controlled. Appreciate [facility] in the pharmacy clinic helping to titrate lisinopril. In the distant past he remembers having a "scratchy throat "with lisinopril perhaps. Currently he is tolerating this well. Continue. He took 1 dose of clonidine however this dropped his blood pressure to the 90s and he did not feel well. He will be hesitant to use again. His blood pressure log is overall fairly reassuring with blood pressures ranging from 445 today systolic to 848 systolic.  COPD-he sees Dr. Annamaria Boots. Certainly is playing a role as well in shortness of breath.  Chronic systolic heart failure- No beta blocker used previously due to bradycardia. ACE inhibitor currently utilized.  38-month follow up   Signed, Candee Furbish, MD [facility]  06/18/2017 10:10 AM

## 2017-06-19 ENCOUNTER — Encounter: Payer: Self-pay | Admitting: *Deleted

## 2017-07-03 ENCOUNTER — Encounter: Payer: Self-pay | Admitting: Internal Medicine

## 2017-07-03 ENCOUNTER — Encounter (INDEPENDENT_AMBULATORY_CARE_PROVIDER_SITE_OTHER): Payer: Self-pay

## 2017-07-03 ENCOUNTER — Ambulatory Visit (INDEPENDENT_AMBULATORY_CARE_PROVIDER_SITE_OTHER): Payer: Medicare Other | Admitting: Internal Medicine

## 2017-07-03 VITALS — BP 128/82 | HR 76 | Ht 70.0 in | Wt 179.8 lb

## 2017-07-03 DIAGNOSIS — I480 Paroxysmal atrial fibrillation: Secondary | ICD-10-CM | POA: Diagnosis not present

## 2017-07-03 DIAGNOSIS — I472 Ventricular tachycardia: Secondary | ICD-10-CM

## 2017-07-03 DIAGNOSIS — I4729 Other ventricular tachycardia: Secondary | ICD-10-CM

## 2017-07-03 DIAGNOSIS — I493 Ventricular premature depolarization: Secondary | ICD-10-CM | POA: Diagnosis not present

## 2017-07-03 DIAGNOSIS — I5022 Chronic systolic (congestive) heart failure: Secondary | ICD-10-CM | POA: Diagnosis not present

## 2017-07-03 NOTE — Progress Notes (Signed)
HPI [name] returns today for followup. He is a pleasant 83 yo man with multiple cardiac problems, s/p PVC ablation remotely, now with PAF, but bleeding after a melanoma diagnosis, and his xarelto was stopped. The patient has mild/mod. LV dysfunction. He has class 2 symptoms. He has not had more syncope. He has been exercising with a stationary bike. His BP has been elevated. He had 6000 pvcs in 24 hours on holter.  Allergies  Allergen Reactions  . Sulfa Antibiotics Hives    Hives      Current Outpatient Prescriptions  Medication Sig Dispense Refill  . amLODipine (NORVASC) 2.5 MG tablet Take 1 tablet (2.5 mg total) by mouth daily. 90 tablet 3  . aspirin 325 MG tablet Take 325 mg by mouth as needed for headache.    Marland Kitchen aspirin EC 81 MG tablet Take 81 mg by mouth daily.    . cholecalciferol (VITAMIN D) 1000 UNITS tablet Take 2,000 Units by mouth daily.     . cloNIDine (CATAPRES) 0.1 MG tablet Take 1 tablet (0.1 mg total) by mouth daily. As needed for systolic blood pressure > 160 mmHg 30 tablet 2  . donepezil (ARICEPT) 10 MG tablet Take 1 tablet (10 mg total) by mouth at bedtime. 90 tablet 2  . lisinopril (PRINIVIL,ZESTRIL) 40 MG tablet Take 1 tablet (40 mg total) by mouth daily. 90 tablet 3  . Multiple Vitamins-Minerals (MULTIVITAMIN WITH MINERALS) tablet Take 1 tablet by mouth daily.    . nortriptyline (PAMELOR) 25 MG capsule TAKE 3 CAPSULES BY MOUTH AT BEDTIME 270 capsule 1  . OLANZapine (ZYPREXA) 10 MG tablet Take 10 mg by mouth at bedtime as needed (as needed to ward off  cluster headaches.).     Marland Kitchen sildenafil (VIAGRA) 100 MG tablet Take 100 mg by mouth daily as needed for erectile dysfunction.    . valACYclovir (VALTREX) 500 MG tablet Take 500 mg by mouth daily.     No current facility-administered medications for this visit.      Past Medical History:  Diagnosis Date  . Back pain   . Bradycardia   . CHF (congestive heart failure) ([place])   . Chronic systolic heart  failure ([place])   . COPD (chronic obstructive pulmonary disease) ([place])   . Dyspnea   . GERD (gastroesophageal reflux disease)   . MGUS (monoclonal gammopathy of unknown significance) 01/25/2015  . Migraine   . Pneumothorax 01/28/2013  . PVC (premature ventricular contraction)   . Syncope    s/p Medtronic ILR implant 05/2013 by Dr Brian Hoover    ROS:   All systems reviewed and negative except as noted in the HPI.   Past Surgical History:  Procedure Laterality Date  . CHEST TUBE INSERTION Right 01/29/2013  . CLAVICLE SURGERY    . LOOP RECORDER IMPLANT  06/02/2013   Medtronic LinQ implanted for syncope by Dr Brian Hoover  . LOOP RECORDER IMPLANT N/A 06/02/2013   Procedure: LOOP RECORDER IMPLANT;  Surgeon: Brian Lance, MD;  Location: [facility] CATH LAB;  Service: Cardiovascular;  Laterality: N/A;  . PROSTATE SURGERY    . SHOULDER ARTHROSCOPY    . TENDON REPAIR     right foot     Family History  Problem Relation Age of Onset  . Breast cancer Mother   . Uterine cancer Mother   . Rheum arthritis Son      Social History   Social History  . Marital status: Married    Spouse name: Brian Hoover  .  Number of children: 2  . Years of education: MA   Occupational History  . retired Chief Financial Officer    Social History Main Topics  . Smoking status: Former Smoker    Packs/day: 1.50    Years: 10.00    Types: Cigarettes    Quit date: 12/04/1960  . Smokeless tobacco: Never Used  . Alcohol use 6.0 oz/week    10 Shots of liquor per week     Comment: 4 Drinks a week  . Drug use: No  . Sexual activity: Not on file   Other Topics Concern  . Not on file   Social History Narrative      Pt lives at home with his spouse.   Caffeine Use: 2 cups daily.     BP 128/82   Pulse 76   Ht 5\' 10"  (1.778 m)   Wt 179 lb 12.8 oz (81.6 kg)   SpO2 98%   BMI 25.80 kg/m   Physical Exam:  Well appearing NAD HEENT: Unremarkable Neck:  No JVD, no thyromegally Lymphatics:  No adenopathy Back:  No CVA tenderness Lungs:   Clear with no wheezes HEART:  Regular rate rhythm, no murmurs, no rubs, no clicks Abd:  soft, positive bowel sounds, no organomegally, no rebound, no guarding Ext:  2 plus pulses, no edema, no cyanosis, no clubbing Skin:  No rashes no nodules Neuro:  CN II through XII intact, motor grossly intact  EKG -  nsr with PVC's   Assess/Plan: 1. Ventricular ectopy - it is polymorphic and less than 10K in 24 hours. Watchful waiting is recommended.  2. Non-ischemic CM - he will continue his medical therapy. He is not a candidate primary prevention ICD herapy due to multiple comorbidities and advanced age.  3. PAF - continue his current meds. Not a candidate for either an [place] or a Watchman at this time.  Brian Hoover.D.

## 2017-07-03 NOTE — Patient Instructions (Addendum)

## 2017-08-03 ENCOUNTER — Telehealth: Payer: Self-pay | Admitting: Cardiology

## 2017-08-03 NOTE — Telephone Encounter (Signed)
Spoke with pt who reports he took his medication last yesterday morning about 7:30 am.  Last evening he checked his BP and it was 80 systolic.  He doesn't know that his HR was but it was irregular per his report.  He has not had any GI issues and has been eating and drinking well.  He does not feel dehydrated.  He feels his BP monitor is accurate because he checked his wife's BP at the same time and it was normal for her.  He reports feeling very tired lately.  BP now is 728 systolic.  Advised to take medications as Rxed and keep a diary of BP's.  Also asked him to record his HR as well.  Pt is aware I will forward this information to Dr [name] for his review and I will c/b if any changes are to be made.  Pt states understanding and agrees with plan.

## 2017-08-03 NOTE — Telephone Encounter (Signed)
Brian Hoover is calling because he has had a significant drop in his blood pressure and wants to know what medications not to take . He also wants to explain to you about his blood pressure . The Systolic pressure has dropped to 80

## 2017-08-07 NOTE — Telephone Encounter (Signed)
Agree with plan. Has history of very labile BP's. Candee Furbish, MD

## 2017-08-30 ENCOUNTER — Encounter: Payer: Medicare Other | Attending: Psychology | Admitting: Psychology

## 2017-08-30 ENCOUNTER — Encounter: Payer: Self-pay | Admitting: Psychology

## 2017-08-30 DIAGNOSIS — G894 Chronic pain syndrome: Secondary | ICD-10-CM | POA: Insufficient documentation

## 2017-08-30 DIAGNOSIS — M545 Low back pain: Secondary | ICD-10-CM | POA: Diagnosis not present

## 2017-08-30 DIAGNOSIS — J449 Chronic obstructive pulmonary disease, unspecified: Secondary | ICD-10-CM | POA: Insufficient documentation

## 2017-08-30 DIAGNOSIS — I5022 Chronic systolic (congestive) heart failure: Secondary | ICD-10-CM | POA: Diagnosis not present

## 2017-08-30 DIAGNOSIS — K219 Gastro-esophageal reflux disease without esophagitis: Secondary | ICD-10-CM | POA: Insufficient documentation

## 2017-08-30 NOTE — Progress Notes (Signed)
Neuropsychological Consultation    Patient:   Brian Hoover   DOB:   May 10, 1932  MR Number:  [id number]  Location:  [facility] [address] [place] [id number] [place] [address] Dept: [phone]           Date of Service:   08/30/2017  Start Time:   8 AM End Time:   9 AM  Provider/Observer:  Ilean Skill, Psy.D.       Clinical Neuropsychologist       Billing Code/Service: [phone] 4 Units  Chief Complaint:    Brian Hoover is an 83 year old male referred by Dr. Maryjean Ka office for psychological evaluation as part of the standard protocol/workup for consideration of spinal cord stimulator trialing and possible feedback. The patient reports that his primary stressor right now has to do with back pain. The patient describes his pain as severe low back pain. He reports that if he stands on his feet for any length of time he initially starts to feel severe pain and will eventually lose motor control of his legs and has fallen multiple times because of this. The patient denies any history of depression or anxiety or other psychiatric illness. However, he reports that he has been experiencing this pain for many years but is been much more severe and problematic over the last 2 years. He reports major difficulty walking or standing.  Reason for Service:  The patient was referred for psychological evaluation as part of the standard protocol for workup for possible spinal cord stimulator trialing and potential implantation.  Current Status:  The patient describes significant and severe pain that develops after standing or walking for review. Looks time. The patient reports that if he sits or lays down that his pain diminishes or goes away. The patient reports that he has fallen multiple times recently.  Reliability of Information: Information is provided by review of available medical  records as well as 1 hour face-to-face clinical interview with the patient.  Behavioral Observation: Brian Hoover  presents as a 83 y.o.-year-old Right Caucasian Male who appeared his stated age. his dress was Appropriate and he was Well Groomed and his manners were Appropriate to the situation.  his participation was indicative of Appropriate and Attentive behaviors.  There were any physical disabilities noted.  he displayed an appropriate level of cooperation and motivation.     Interactions:    Active Appropriate and Attentive  Attention:   within normal limits and attention span and concentration were age appropriate  Memory:   within normal limits; recent and remote memory intact  Visuo-spatial:  within normal limits  Speech (Volume):  normal  Speech:   normal; normal  Thought Process:  Coherent and Relevant  Though Content:  WNL;   Orientation:   person, place, time/date and situation  Judgment:   Good  Planning:   Good  Affect:    Appropriate  Mood:    Euthymic  Insight:   Good  Intelligence:   high  Marital Status/Living: The patient reports that he was born and raised in [place]. The patient is married and has 2 adult children. He currently lives with his wife.  Current Employment: The patient is retired.  Substance Use:  No concerns of substance abuse are reported.    Education:   The patient has his bachelors degree and masters degree from the [employer] [place].  Medical History:   Past  Medical History:  Diagnosis Date  . Back pain   . Bradycardia   . CHF (congestive heart failure) ([place])   . Chronic systolic heart failure ([place])   . COPD (chronic obstructive pulmonary disease) ([place])   . Dyspnea   . GERD (gastroesophageal reflux disease)   . MGUS (monoclonal gammopathy of unknown significance) 01/25/2015  . Migraine   . Pneumothorax 01/28/2013  . PVC (premature ventricular contraction)   . Syncope    s/p Medtronic ILR implant 05/2013 by Dr  [name]       @[email or web address] @       Abuse/Trauma History: The patient denies any history of trauma or abuse.  Psychiatric History:  There is no prior psychiatric history.  Family Med/Psych History:  Family History  Problem Relation Age of Onset  . Breast cancer Mother   . Uterine cancer Mother   . Rheum arthritis Son     Risk of Suicide/Violence: virtually non-existent the patient denies any history or current symptoms of suicidal ideation.  Impression/DX:  Brian Hoover is an 83 year old male referred by Dr. Maryjean Ka office for psychological evaluation as part of the standard protocol/workup for consideration of spinal cord stimulator trialing and possible feedback. The patient reports that his primary stressor right now has to do with back pain. The patient describes his pain as severe low back pain. He reports that if he stands on his feet for any length of time he initially starts to feel severe pain and will eventually lose motor control of his legs and has fallen multiple times because of this. The patient denies any history of depression or anxiety or other psychiatric illness. However, he reports that he has been experiencing this pain for many years but is been much more severe and problematic over the last 2 years. He reports major difficulty walking or standing.  The patient denies any significant symptoms consistent with depression or anxiety. The patient does acknowledge significant frustration with his loss of functioning over the past several years due to severe pain and now falling. The patient reports some apprehension about this procedure but is looking to get more information. The patient is taking no medications for psychiatric purposes. There are no reports or indications of any significant psychosocial variables that are overly deleterious or impactful on his current life situation.  Disposition/Plan:  The patient will complete the [place] multiphasic personality  inventory-2 as well as the pain patient profile.  Diagnosis:    Chronic pain syndrome         Electronically Signed   _______________________ Ilean Skill, Psy.D.

## 2017-09-02 IMAGING — CR DG LUMBAR SPINE 2-3V
3 series · 3 of 3 positions shown · non-contrast
Comparison: KUB December 19, 2016 and coronal and sagittal images
through the lumbar spine from an abdominal and pelvic CT scan dated
November 22, 2016

CLINICAL DATA: Chronic worsening low back pain for several years
without known trauma

EXAM:
LUMBAR SPINE - 2-3 VIEW

[w lumbar spine ap]
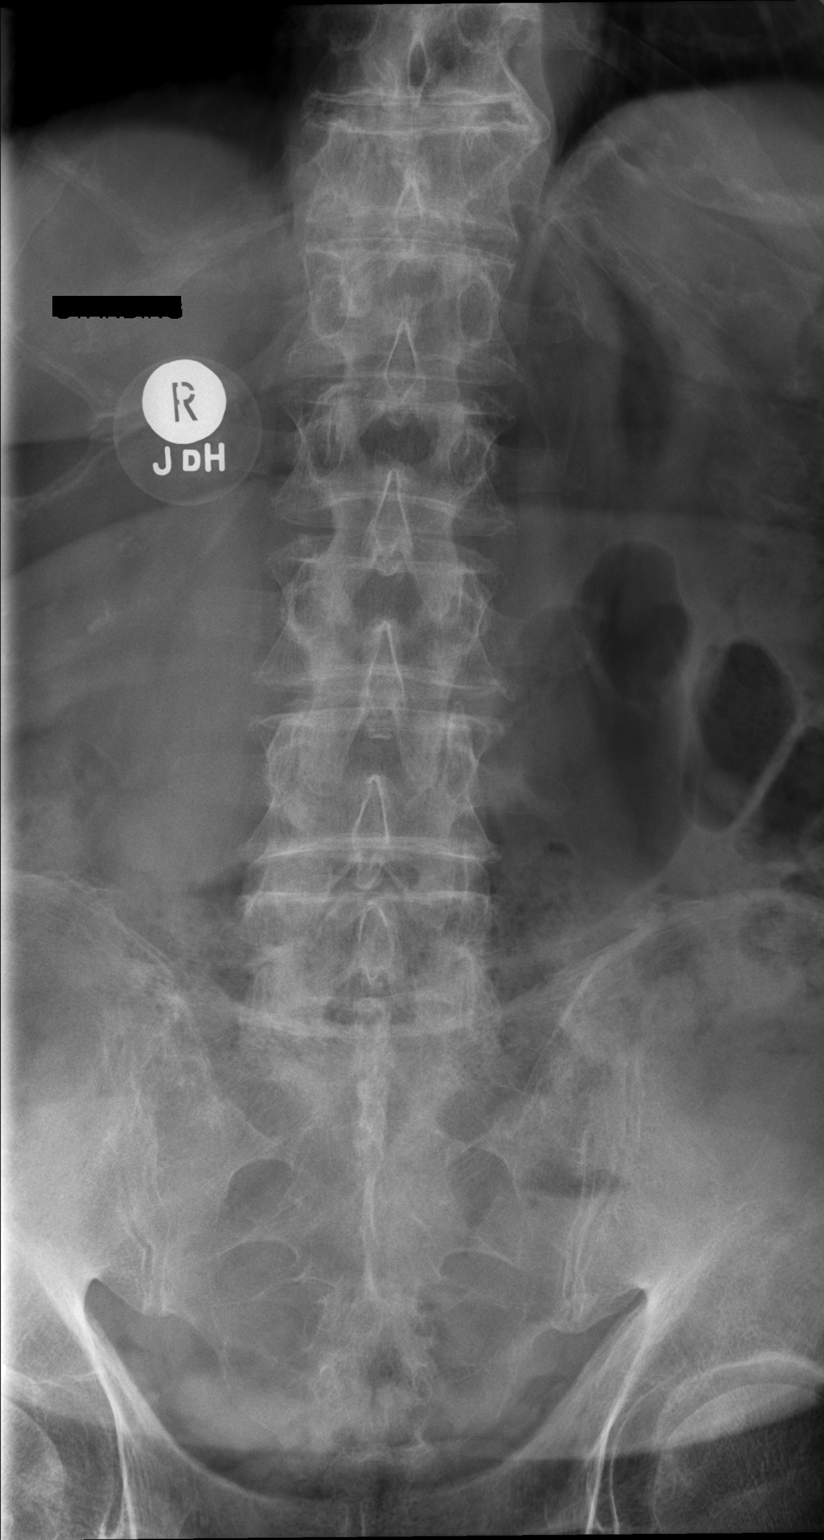

[w lumbar spine lat]
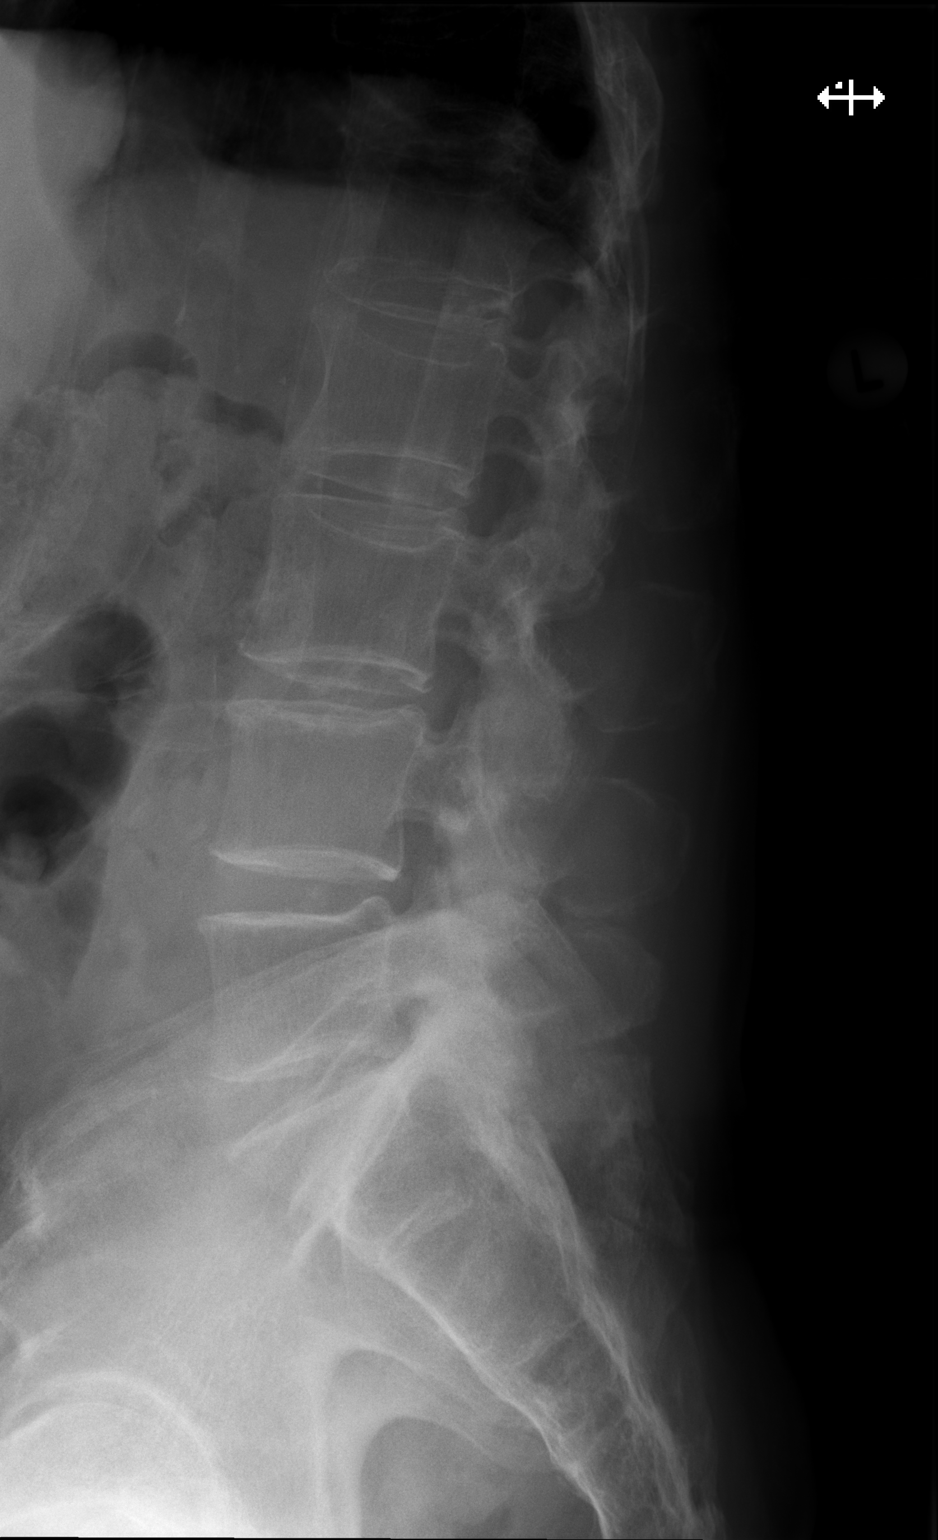

[w lumbar l-5 s-1 spot]
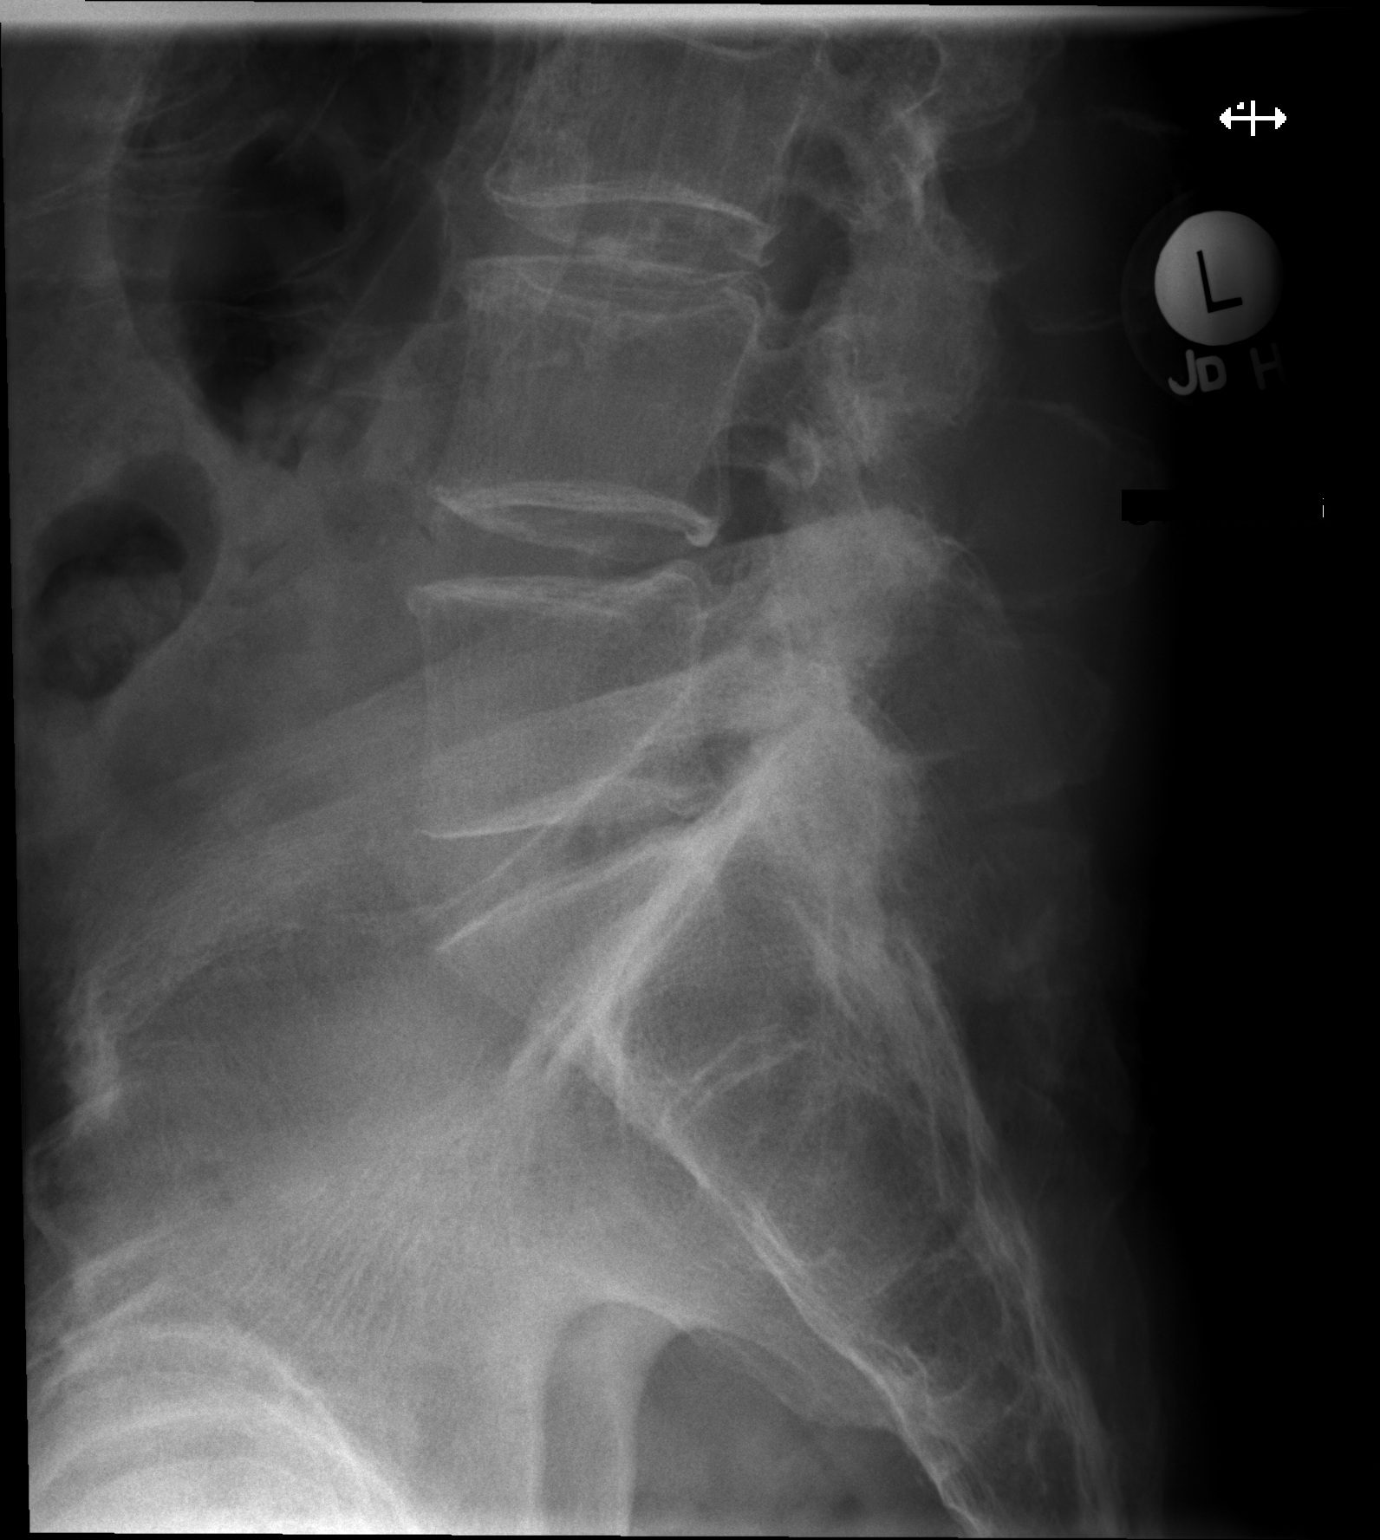

[3 of 3 positions shown; findings below may reference images not displayed]

FINDINGS: The lumbar vertebral bodies are preserved in height. The disc space
heights are reasonably well-maintained. There is no
spondylolisthesis. There is facet joint hypertrophy at L5-S1. The
pedicles and transverse processes are intact. The observed portions
of the sacrum are normal.
IMPRESSION: Degenerative facet joint hypertrophy at L5-S1. No compression
fracture, high-grade disc space narrowing, nor significant
abnormality.

## 2017-09-06 ENCOUNTER — Encounter: Payer: Self-pay | Admitting: Psychology

## 2017-09-06 ENCOUNTER — Encounter: Payer: Medicare Other | Attending: Psychology | Admitting: Psychology

## 2017-09-06 DIAGNOSIS — J449 Chronic obstructive pulmonary disease, unspecified: Secondary | ICD-10-CM | POA: Diagnosis not present

## 2017-09-06 DIAGNOSIS — G894 Chronic pain syndrome: Secondary | ICD-10-CM | POA: Diagnosis not present

## 2017-09-06 DIAGNOSIS — M545 Low back pain: Secondary | ICD-10-CM | POA: Diagnosis not present

## 2017-09-06 DIAGNOSIS — K219 Gastro-esophageal reflux disease without esophagitis: Secondary | ICD-10-CM | POA: Insufficient documentation

## 2017-09-06 DIAGNOSIS — I5022 Chronic systolic (congestive) heart failure: Secondary | ICD-10-CM | POA: Diagnosis not present

## 2017-09-06 NOTE — Progress Notes (Signed)
Patient:  Brian Hoover   DOB: 05-Sep-1932  MR Number: [id number]  Location: [facility] [address], [place] [address] Dept: [phone]  Start: 3 PM End: 4 PM  Provider/Observer:     Brian Roys PSYD  Chief Complaint:      Chief Complaint  Patient presents with  . Pain    Reason For Service:     Brian Hoover is an 83 year old male referred by Dr. Maryjean Hoover office for psychological evaluation as part of the standard protocol/workup for consideration of spinal cord stimulator trialing and possible feedback. The patient reports that his primary stressor right now has to do with back pain. The patient describes his pain as severe low back pain. He reports that if he stands on his feet for any length of time he initially starts to feel severe pain and will eventually lose motor control of his legs and has fallen multiple times because of this. The patient denies any history of depression or anxiety or other psychiatric illness. However, he reports that he has been experiencing this pain for many years but is been much more severe and problematic over the last 2 years. He reports major difficulty walking or standing.  Current Status:  The patient describes chronic pain that is worsened over the past 2 years. He creates major difficulties walking and standing for any length of time.  Reliability of Information: Information is derived from review of available medical records as well as 1 hour clinical interview from the patient and the patient completed the [place] multiphasic personality inventory-2 as well as the pain patient profile.   Testing Administered:  The patient completed the [place] multiphasic personality inventory-2 as well as the pain patient profile.  Participation Level:   Active  Participation Quality:  Appropriate and Attentive       Behavioral Observation:  Well Groomed, Alert, and Appropriate.   Test Results:   Initially, the patient completed the [place] multiphasic personality inventory-II. Review of validity scales shows that the patient appeared to approach the measure in an honest and straightforward manner neither are attempting to exaggerate or minimize his current symptomatology. This does appear to be a valid assessment.  Review of basic clinical scales shows that there are no clinically significant elevations except with regard to specific physical and health concerns. The patient denies any significant degree of depression or anxiety and denies any psychotic types of symptoms or significant mood disturbance. Further analysis utilizing content scales show that the only elevation on the scales had to do with specific health concerns specifically about chronic pain symptoms. There no indications of anxiety or depression or problematic/disruptive psychosocial features such as family conflicts or activity related conflicts. Supplemental scales do show that the patient has a strong need for being socially responsible but at the same time tends to avoid certain social interactions. However, the patient does appear to be well adjusted. Scales associated with increased risk of substance abuse and alcohol abuse were not elevated and neither of the PTSD scales were elevated. Further analysis showed that the patient does endorse a number of items related to physical malfunction which are consistent with his stated clinical features. The patient also describes issues related to malaise and fatigue but no other clinical features were endorsed.  The patient also completed the pain patient profile index which is a measure that helps adjust to factors that can be  elevated on the MMPI relative to physical pain symptoms and adjust for those in chronic pain patients. This inventory has both a normative non-pain nonpsychiatric sample as  well as a pain patient population sample without psychiatric symptoms. The patient did have an endorsement of more somatic/pain concerns than the normative/community sample but well below those typically found with nonpsychiatric pain patients. The patient had a significantly reduced level of anxiety both relative to the pain patient population as well as the community-based normative sample. The patient did have more features related to depression relative to the community-based sample but well below those found with the pain patient population. As before with the MMPI this elevation on symptoms of depression were almost exclusively on items associated with physical disruption and  Summary of Results:   The results of the current objective psychological evaluation do not suggest any significant psychiatric difficulties or symptoms. There is no indications of significant depression or anxiety and clearly no indications of any long-standing serious psychiatric disorders. The patient does not appear to have any issues with PTSD type symptoms, risk for substance abuse, or conditions such as bipolar disorder. The patient also does not have any significant issues related to depression or anxiety. The patient's profile is specifically consistent with those with chronic pain disorder. Also, given the patient's age he appears to be quite sharp and there are no indications of mental status issues that would be problematic related to any memory dysfunctions or other cognitive deficits.  Impression/Diagnosis:   The results of the current psychological evaluation do suggest that from a psychological/psychiatric standpoint the patient is an excellent candidate for spinal cord stimulator trialing and possible implantation. There are no significant psychosocial features that would be overly disruptive to the trialing phase. The patient clearly has the cognitive abilities and memory abilities needed for these trialing phase and  possible implantation. There no indications of depression or anxiety, vulnerability to substance abuse, or significant psychiatric illness.  Diagnosis:    Axis I: Chronic pain syndrome   Brian Hoover, Psy.D. Neuropsychologist

## 2017-09-07 ENCOUNTER — Telehealth: Payer: Self-pay | Admitting: *Deleted

## 2017-09-07 NOTE — Telephone Encounter (Signed)
Brian Hoover was looking for Brian Hoover records that were supposed to go to Brian Hoover office.  I let her know that we did take them over but the office was closed.  If they were not left with [name] the carrier is not here at present) they will go over first thing Monday morning.

## 2017-09-09 ENCOUNTER — Emergency Department (HOSPITAL_BASED_OUTPATIENT_CLINIC_OR_DEPARTMENT_OTHER): Payer: Medicare Other

## 2017-09-09 ENCOUNTER — Encounter (HOSPITAL_BASED_OUTPATIENT_CLINIC_OR_DEPARTMENT_OTHER): Payer: Self-pay | Admitting: Emergency Medicine

## 2017-09-09 ENCOUNTER — Emergency Department (HOSPITAL_BASED_OUTPATIENT_CLINIC_OR_DEPARTMENT_OTHER)
Admission: EM | Admit: 2017-09-09 | Discharge: 2017-09-09 | Disposition: A | Discharge status: Home / Self Care | Payer: Medicare Other | Attending: Emergency Medicine | Admitting: Emergency Medicine

## 2017-09-09 DIAGNOSIS — W19XXXA Unspecified fall, initial encounter: Secondary | ICD-10-CM | POA: Diagnosis not present

## 2017-09-09 DIAGNOSIS — Z79899 Other long term (current) drug therapy: Secondary | ICD-10-CM | POA: Insufficient documentation

## 2017-09-09 DIAGNOSIS — Z7982 Long term (current) use of aspirin: Secondary | ICD-10-CM | POA: Diagnosis not present

## 2017-09-09 DIAGNOSIS — S42031A Displaced fracture of lateral end of right clavicle, initial encounter for closed fracture: Secondary | ICD-10-CM | POA: Diagnosis not present

## 2017-09-09 DIAGNOSIS — Y92018 Other place in single-family (private) house as the place of occurrence of the external cause: Secondary | ICD-10-CM | POA: Diagnosis not present

## 2017-09-09 DIAGNOSIS — Y998 Other external cause status: Secondary | ICD-10-CM | POA: Insufficient documentation

## 2017-09-09 DIAGNOSIS — I4891 Unspecified atrial fibrillation: Secondary | ICD-10-CM | POA: Insufficient documentation

## 2017-09-09 DIAGNOSIS — J449 Chronic obstructive pulmonary disease, unspecified: Secondary | ICD-10-CM | POA: Insufficient documentation

## 2017-09-09 DIAGNOSIS — Y9301 Activity, walking, marching and hiking: Secondary | ICD-10-CM | POA: Diagnosis not present

## 2017-09-09 DIAGNOSIS — Z87891 Personal history of nicotine dependence: Secondary | ICD-10-CM | POA: Insufficient documentation

## 2017-09-09 DIAGNOSIS — S4991XA Unspecified injury of right shoulder and upper arm, initial encounter: Secondary | ICD-10-CM | POA: Diagnosis present

## 2017-09-09 DIAGNOSIS — I5022 Chronic systolic (congestive) heart failure: Secondary | ICD-10-CM | POA: Insufficient documentation

## 2017-09-09 MED ORDER — IBUPROFEN 600 MG PO TABS: 600.0000 mg | ORAL_TABLET | Freq: Three times a day (TID) | ORAL | 0 refills | Status: DC | PRN

## 2017-09-09 NOTE — ED Provider Notes (Signed)
[facility] DEPT [facility] Provider Note   CSN: [id number] Arrival date & time: 09/09/17  1120     History   Chief Complaint Chief Complaint  Patient presents with  . Fall    HPI Brian Hoover is a 83 y.o. male.  HPI Patient fell coming out of his back porch 2 days ago. He landed on his right shoulder. He reports he lightly struck his head but was not knocked out. He has not had any headaches or confusion. No neck pain. He reports he only area of pain is the right shoulder. There has been some bruising. He has been icing it and taking ibuprofen. No numbness or tingling to the hand. Past Medical History:  Diagnosis Date  . Back pain   . Bradycardia   . CHF (congestive heart failure) ([place])   . Chronic systolic heart failure ([place])   . COPD (chronic obstructive pulmonary disease) ([place])   . Dyspnea   . GERD (gastroesophageal reflux disease)   . MGUS (monoclonal gammopathy of unknown significance) 01/25/2015  . Migraine   . Pneumothorax 01/28/2013  . PVC (premature ventricular contraction)   . Syncope    s/p Medtronic ILR implant 05/2013 by Dr [name]    Patient Active Problem List   Diagnosis Date Noted  . Obstructive chronic bronchitis with exacerbation ([place]) 01/05/2017  . Chronic anticoagulation 10/11/2015  . Atrial fibrillation ([place]) 09/15/2015  . Sinus node dysfunction ([place]) 09/15/2015  . Cough 08/01/2015  . Cephalalgia 04/26/2015  . MGUS (monoclonal gammopathy of unknown significance) 01/25/2015  . Essential hypertension 11/03/2013  . Dizziness and giddiness 05/08/2013  . Near syncope 05/08/2013  . Right foot drop 03/11/2013  . Unspecified constipation 02/04/2013  . Traumatic pneumothorax (Right) 01/29/2013  . Multiple rib fractures (Right) 01/29/2013  . Syncope 10/29/2012  . Pruritus 01/20/2012  . PVC's (premature ventricular contractions) 09/20/2009  . Chronic systolic heart failure ([place]) 09/20/2009  . BRADYCARDIA 09/01/2009  . UPPER RESPIRATORY INFECTION, ACUTE  09/01/2009  . Centrilobular emphysema ([place]) 05/04/2009  . DYSPNEA 03/22/2009    Past Surgical History:  Procedure Laterality Date  . CHEST TUBE INSERTION Right 01/29/2013  . CLAVICLE SURGERY    . LOOP RECORDER IMPLANT  06/02/2013   Medtronic LinQ implanted for syncope by Dr [name]  . LOOP RECORDER IMPLANT N/A 06/02/2013   Procedure: LOOP RECORDER IMPLANT;  Surgeon: [name], MD;  Location: [facility] CATH LAB;  Service: Cardiovascular;  Laterality: N/A;  . PROSTATE SURGERY    . SHOULDER ARTHROSCOPY    . TENDON REPAIR     right foot       Home Medications    Prior to Admission medications   Medication Sig Start Date End Date Taking? Authorizing Provider  amLODipine (NORVASC) 2.5 MG tablet Take 1 tablet (2.5 mg total) by mouth daily. 06/18/17   Jerline Pain, MD  aspirin 325 MG tablet Take 325 mg by mouth as needed for headache.    [provider]  aspirin EC 81 MG tablet Take 81 mg by mouth daily.    [provider]  cholecalciferol (VITAMIN D) 1000 UNITS tablet Take 2,000 Units by mouth daily.     [provider]  cloNIDine (CATAPRES) 0.1 MG tablet Take 1 tablet (0.1 mg total) by mouth daily. As needed for systolic blood pressure > 160 mmHg 06/18/17   Jerline Pain, MD  donepezil (ARICEPT) 10 MG tablet Take 1 tablet (10 mg total) by mouth at bedtime. 05/03/17   Pieter Partridge,  DO  ibuprofen (ADVIL,MOTRIN) 600 MG tablet Take 1 tablet (600 mg total) by mouth every 8 (eight) hours as needed. 09/09/17   Charlesetta Shanks, MD  lisinopril (PRINIVIL,ZESTRIL) 40 MG tablet Take 1 tablet (40 mg total) by mouth daily. 10/11/16   Jerline Pain, MD  Multiple Vitamins-Minerals (MULTIVITAMIN WITH MINERALS) tablet Take 1 tablet by mouth daily.    [provider]  nortriptyline (PAMELOR) 25 MG capsule TAKE 3 CAPSULES BY MOUTH AT BEDTIME 06/11/17   Jaffe, Adam R, DO  OLANZapine (ZYPREXA) 10 MG tablet Take 10 mg by mouth at bedtime as needed (as needed to ward off  cluster  headaches.).     [provider]  sildenafil (VIAGRA) 100 MG tablet Take 100 mg by mouth daily as needed for erectile dysfunction.    [provider]  valACYclovir (VALTREX) 500 MG tablet Take 500 mg by mouth daily.    [provider]    Family History Family History  Problem Relation Age of Onset  . Breast cancer Mother   . Uterine cancer Mother   . Rheum arthritis Son     Social History Social History  Substance Use Topics  . Smoking status: Former Smoker    Packs/day: 1.50    Years: 10.00    Types: Cigarettes    Quit date: 12/04/1960  . Smokeless tobacco: Never Used  . Alcohol use 6.0 oz/week    10 Shots of liquor per week     Comment: 4 Drinks a week     Allergies   Sulfa antibiotics   Review of Systems Review of Systems Neurologic: No headache no confusion no visual change Respiratory: No chest pain no cough no shortness of breath. GI: No abdominal pain no nausea no vomiting.  Physical Exam Updated Vital Signs BP 136/89 (BP Location: Left Arm)   Pulse 82   Temp 97.7 F (36.5 C) (Oral)   Resp 18   Ht 5\' 10"  (1.778 m)   Wt 78.9 kg (174 lb)   SpO2 98%   BMI 24.97 kg/m   Physical Exam  Constitutional: He is oriented to person, place, and time. He appears well-developed and well-nourished. No distress.  HENT:  Head: Normocephalic and atraumatic.  Eyes: EOM are normal.  Neck: Neck supple.  Cardiovascular: Normal rate, regular rhythm, normal heart sounds and intact distal pulses.   Pulmonary/Chest: Effort normal and breath sounds normal. He exhibits no tenderness.  Abdominal: Soft. He exhibits no distension. There is no tenderness.  Musculoskeletal:  Patient has ecchymotic bruising on the anterior chest wall inferior to the clavicle. Tenderness at the distal clavicle. He has intact for flexion and extension with some pain limitation but functional. Range her arm without pain or edema. Patient is neurovascularly intact.    Neurological: He is alert and oriented to person, place, and time. No cranial nerve deficit. He exhibits normal muscle tone. Coordination normal.  Skin: Skin is warm and dry.  Psychiatric: He has a normal mood and affect.     ED Treatments / Results  Labs (all labs ordered are listed, but only abnormal results are displayed) Labs Reviewed - No data to display  EKG  EKG Interpretation None       Radiology Dg Shoulder Right  Result Date: 09/09/2017 CLINICAL DATA:  83 year old male with persistent right shoulder pain since falling this past Friday EXAM: RIGHT SHOULDER - 2+ VIEW COMPARISON:  Remote MRI of the right shoulder 01/07/2007 FINDINGS: Acute mildly displaced fracture through the distal aspect  of the right clavicle. There is apex superior angulation of the fracture site. Incompletely imaged surgical repair of AA left clavicle fracture. Trace atherosclerotic calcifications present in the visualized aorta. The lungs are clear. IMPRESSION: 1. Acute mildly displaced fracture through the distal aspect of the right clavicle with apex superior angulation of the fracture site. 2.  Aortic Atherosclerosis (ICD10-170.0) Electronically Signed   By: Jacqulynn Cadet M.D.   On: 09/09/2017 12:12    Procedures Procedures (including critical care time)  Medications Ordered in ED Medications - No data to display   Initial Impression / Assessment and Plan / ED Course  I have reviewed the triage vital signs and the nursing notes.  Pertinent labs & imaging results that were available during my care of the patient were reviewed by me and considered in my medical decision making (see chart for details).     Final Clinical Impressions(s) / ED Diagnoses   Final diagnoses:  Closed displaced fracture of acromial end of right clavicle, initial encounter   Patient has uncomplicated distal clavicle fracture. Injury occurred 2 days ago. No other apparent injuries. At this time, patient does not  wish to take sling. He reports he is keeping his arm mostly immobilizing his side and is concerned sling may impede his ability steady himself if he loses his balance which he does at times. He reports he is really not having much pain if he keeps it still. Patient will follow-up with [facility] orthopedics. New Prescriptions New Prescriptions   IBUPROFEN (ADVIL,MOTRIN) 600 MG TABLET    Take 1 tablet (600 mg total) by mouth every 8 (eight) hours as needed.     Charlesetta Shanks, MD 09/09/17 1444

## 2017-09-09 NOTE — ED Triage Notes (Signed)
Fall on Friday. C/o R shoulder pain. Denies LOC.

## 2017-09-10 ENCOUNTER — Ambulatory Visit: Payer: Medicare Other | Admitting: Psychology

## 2017-09-11 ENCOUNTER — Ambulatory Visit: Payer: Medicare Other | Admitting: Neurology

## 2017-09-12 ENCOUNTER — Telehealth: Payer: Self-pay | Admitting: Internal Medicine

## 2017-09-12 NOTE — Telephone Encounter (Signed)
Call returned to Pt.  Pt states he had recent fall with broken collar bone.  Since then he has had continued lightheadedness, feels his pulse is irregular.  States his systolic blood pressure has been low for him in the 100-110.  States has been shaking.  Pt unsure if this is related to heart but wants to be sure.  Pt scheduled to see Dr. [name] on Friday.  No further action needed at this time.

## 2017-09-12 NOTE — Telephone Encounter (Signed)
New message     Pt fell on Friday , complained of being lightheaded, went to ER has broken collar bone,  They told him it will take a month to heal, pt is still complaining of lightheadedness and he is shaking and very nervous , wife would like a call back. Made pt with Renee appt for 10/15 8a

## 2017-09-13 ENCOUNTER — Ambulatory Visit: Payer: Medicare Other | Admitting: Psychology

## 2017-09-14 ENCOUNTER — Ambulatory Visit (INDEPENDENT_AMBULATORY_CARE_PROVIDER_SITE_OTHER): Payer: Medicare Other | Admitting: Internal Medicine

## 2017-09-14 ENCOUNTER — Encounter: Payer: Self-pay | Admitting: Internal Medicine

## 2017-09-14 VITALS — BP 144/90 | HR 80 | Ht 70.0 in | Wt 173.6 lb

## 2017-09-14 DIAGNOSIS — R55 Syncope and collapse: Secondary | ICD-10-CM

## 2017-09-14 DIAGNOSIS — I480 Paroxysmal atrial fibrillation: Secondary | ICD-10-CM | POA: Diagnosis not present

## 2017-09-14 DIAGNOSIS — I493 Ventricular premature depolarization: Secondary | ICD-10-CM

## 2017-09-14 DIAGNOSIS — R42 Dizziness and giddiness: Secondary | ICD-10-CM | POA: Diagnosis not present

## 2017-09-14 DIAGNOSIS — W19XXXS Unspecified fall, sequela: Secondary | ICD-10-CM | POA: Diagnosis not present

## 2017-09-14 NOTE — Patient Instructions (Addendum)
Medication Instructions:  Your physician recommends that you continue on your current medications as directed. Please refer to the Current Medication list given to you today.  Labwork: None ordered.  Testing/Procedures: Your physician has recommended that you wear an event monitor. Event monitors are medical devices that record the heart's electrical activity. Doctors most often [place] these monitors to diagnose arrhythmias. Arrhythmias are problems with the speed or rhythm of the heartbeat. The monitor is a small, portable device. You can wear one while you do your normal daily activities. This is usually used to diagnose what is causing palpitations/syncope (passing out).  Please schedule patient for an event monitor 5 weeks from now.  Follow-Up:  Your physician wants you to follow-up after event monitor is complete with Dr. [name] last week of December/first few weeks of January.    Any Other Special Instructions Will Be Listed Below (If Applicable).     If you need a refill on your cardiac medications before your next appointment, please call your pharmacy.

## 2017-09-14 NOTE — Progress Notes (Signed)
HPI Mr. Halt returns today after experiencing a fractured collar bone. He is a pleasant 83 yo man with a remote PVC ablation, PAF, falls, remote PTX, s/p chest tube after a fracture. He was in his usual state of health until a week ago when he fell and fractured his clavicle. He denies passing out. He states his legs gave way. No palpitations.  Allergies  Allergen Reactions  . Sulfa Antibiotics Hives    Hives      Current Outpatient Prescriptions  Medication Sig Dispense Refill  . amLODipine (NORVASC) 2.5 MG tablet Take 1 tablet (2.5 mg total) by mouth daily. 90 tablet 3  . aspirin 325 MG tablet Take 325 mg by mouth as needed for headache.    Marland Kitchen aspirin EC 81 MG tablet Take 81 mg by mouth daily.    . cholecalciferol (VITAMIN D) 1000 UNITS tablet Take 2,000 Units by mouth daily.     . cloNIDine (CATAPRES) 0.1 MG tablet Take 1 tablet (0.1 mg total) by mouth daily. As needed for systolic blood pressure > 160 mmHg 30 tablet 2  . donepezil (ARICEPT) 10 MG tablet Take 1 tablet (10 mg total) by mouth at bedtime. 90 tablet 2  . ibuprofen (ADVIL,MOTRIN) 600 MG tablet Take 1 tablet (600 mg total) by mouth every 8 (eight) hours as needed. 30 tablet 0  . lisinopril (PRINIVIL,ZESTRIL) 40 MG tablet Take 1 tablet (40 mg total) by mouth daily. 90 tablet 3  . Multiple Vitamins-Minerals (MULTIVITAMIN WITH MINERALS) tablet Take 1 tablet by mouth daily.    . nortriptyline (PAMELOR) 25 MG capsule TAKE 3 CAPSULES BY MOUTH AT BEDTIME 270 capsule 1  . OLANZapine (ZYPREXA) 10 MG tablet Take 10 mg by mouth at bedtime as needed (as needed to ward off  cluster headaches.).     Marland Kitchen sildenafil (VIAGRA) 100 MG tablet Take 100 mg by mouth daily as needed for erectile dysfunction.    . valACYclovir (VALTREX) 500 MG tablet Take 500 mg by mouth daily.     No current facility-administered medications for this visit.      Past Medical History:  Diagnosis Date  . Back pain   . Bradycardia   . CHF (congestive  heart failure) ([place])   . Chronic systolic heart failure ([place])   . COPD (chronic obstructive pulmonary disease) ([place])   . Dyspnea   . GERD (gastroesophageal reflux disease)   . MGUS (monoclonal gammopathy of unknown significance) 01/25/2015  . Migraine   . Pneumothorax 01/28/2013  . PVC (premature ventricular contraction)   . Syncope    s/p Medtronic ILR implant 05/2013 by Dr [name]    ROS:   All systems reviewed and negative except as noted in the HPI.   Past Surgical History:  Procedure Laterality Date  . CHEST TUBE INSERTION Right 01/29/2013  . CLAVICLE SURGERY    . LOOP RECORDER IMPLANT  06/02/2013   Medtronic LinQ implanted for syncope by Dr [name]  . LOOP RECORDER IMPLANT N/A 06/02/2013   Procedure: LOOP RECORDER IMPLANT;  Surgeon: [name], MD;  Location: [facility] CATH LAB;  Service: Cardiovascular;  Laterality: N/A;  . PROSTATE SURGERY    . SHOULDER ARTHROSCOPY    . TENDON REPAIR     right foot     Family History  Problem Relation Age of Onset  . Breast cancer Mother   . Uterine cancer Mother   . Rheum arthritis Son      Social History   Social  History  . Marital status: Married    Spouse name: [name]  . Number of children: 2  . Years of education: MA   Occupational History  . retired Chief Financial Officer    Social History Main Topics  . Smoking status: Former Smoker    Packs/day: 1.50    Years: 10.00    Types: Cigarettes    Quit date: 12/04/1960  . Smokeless tobacco: Never Used  . Alcohol use 6.0 oz/week    10 Shots of liquor per week     Comment: 4 Drinks a week  . Drug use: No  . Sexual activity: Not on file   Other Topics Concern  . Not on file   Social History Narrative      Pt lives at home with his spouse.   Caffeine Use: 2 cups daily.     BP (!) 144/90   Pulse 80   Ht 5\' 10"  (1.778 m)   Wt 173 lb 9.6 oz (78.7 kg)   SpO2 97%   BMI 24.91 kg/m   Physical Exam:  Well appearing 83 yo man, NAD HEENT: Unremarkable Neck:  7 cm JVD, no  thyromegally Lymphatics:  No adenopathy Back:  No CVA tenderness Lungs:  Clear with no wheezes. Right chest with a large area of ecchymosis HEART:  Regular rate rhythm, no murmurs, no rubs, no clicks Abd:  soft, positive bowel sounds, no organomegally, no rebound, no guarding Ext:  2 plus pulses, no edema, no cyanosis, no clubbing Skin:  No rashes no nodules Neuro:  CN II through XII intact, motor grossly intact   Assess/Plan: 1. Unexplained fall - I discussed the possible etiologies. He has known bradycardia. He is certain he did not pass out. I am concerned that he is experiencing symptomatic bradycardia. I have offered him the option of wearing a 4 week monitor or having another ILR inserted. He will wear the monitor once he has healed up from his fractured clavicle 2. PVC's - he has less than 10K in 24 hours. No symptoms for now. 3. Chronic systolic heart failure - his symptoms are class 2. He is encouraged to continue his current meds.   Mikle Bosworth.D.

## 2017-09-15 ENCOUNTER — Other Ambulatory Visit: Payer: Self-pay

## 2017-09-15 ENCOUNTER — Emergency Department (HOSPITAL_COMMUNITY): Payer: Medicare Other

## 2017-09-15 ENCOUNTER — Inpatient Hospital Stay (HOSPITAL_COMMUNITY)
Admission: EM | Admit: 2017-09-15 | Discharge: 2017-09-18 | DRG: 309 | Disposition: A | Discharge status: Home / Self Care | Payer: Medicare Other | Attending: Internal Medicine | Admitting: Internal Medicine

## 2017-09-15 ENCOUNTER — Encounter (HOSPITAL_COMMUNITY): Payer: Self-pay | Admitting: Emergency Medicine

## 2017-09-15 DIAGNOSIS — Z7982 Long term (current) use of aspirin: Secondary | ICD-10-CM | POA: Diagnosis not present

## 2017-09-15 DIAGNOSIS — F0390 Unspecified dementia without behavioral disturbance: Secondary | ICD-10-CM | POA: Diagnosis present

## 2017-09-15 DIAGNOSIS — Z9981 Dependence on supplemental oxygen: Secondary | ICD-10-CM

## 2017-09-15 DIAGNOSIS — E871 Hypo-osmolality and hyponatremia: Secondary | ICD-10-CM | POA: Diagnosis present

## 2017-09-15 DIAGNOSIS — K219 Gastro-esophageal reflux disease without esophagitis: Secondary | ICD-10-CM | POA: Diagnosis present

## 2017-09-15 DIAGNOSIS — I248 Other forms of acute ischemic heart disease: Secondary | ICD-10-CM | POA: Diagnosis present

## 2017-09-15 DIAGNOSIS — I5022 Chronic systolic (congestive) heart failure: Secondary | ICD-10-CM | POA: Diagnosis present

## 2017-09-15 DIAGNOSIS — Z882 Allergy status to sulfonamides status: Secondary | ICD-10-CM | POA: Diagnosis not present

## 2017-09-15 DIAGNOSIS — I110 Hypertensive heart disease with heart failure: Secondary | ICD-10-CM | POA: Diagnosis present

## 2017-09-15 DIAGNOSIS — I480 Paroxysmal atrial fibrillation: Secondary | ICD-10-CM | POA: Diagnosis present

## 2017-09-15 DIAGNOSIS — J449 Chronic obstructive pulmonary disease, unspecified: Secondary | ICD-10-CM | POA: Diagnosis present

## 2017-09-15 DIAGNOSIS — Z79899 Other long term (current) drug therapy: Secondary | ICD-10-CM | POA: Diagnosis not present

## 2017-09-15 DIAGNOSIS — Z87891 Personal history of nicotine dependence: Secondary | ICD-10-CM | POA: Diagnosis not present

## 2017-09-15 DIAGNOSIS — R748 Abnormal levels of other serum enzymes: Secondary | ICD-10-CM | POA: Diagnosis not present

## 2017-09-15 DIAGNOSIS — R000 Tachycardia, unspecified: Secondary | ICD-10-CM | POA: Diagnosis present

## 2017-09-15 DIAGNOSIS — I4892 Unspecified atrial flutter: Secondary | ICD-10-CM | POA: Diagnosis present

## 2017-09-15 DIAGNOSIS — R7989 Other specified abnormal findings of blood chemistry: Secondary | ICD-10-CM

## 2017-09-15 DIAGNOSIS — I4891 Unspecified atrial fibrillation: Secondary | ICD-10-CM

## 2017-09-15 DIAGNOSIS — R778 Other specified abnormalities of plasma proteins: Secondary | ICD-10-CM

## 2017-09-15 DIAGNOSIS — I453 Trifascicular block: Principal | ICD-10-CM | POA: Diagnosis present

## 2017-09-15 HISTORY — DX: Trifascicular block: I45.3

## 2017-09-15 HISTORY — DX: Unspecified atrial fibrillation: I48.91

## 2017-09-15 HISTORY — DX: Essential (primary) hypertension: I10

## 2017-09-15 LAB — CBC WITH DIFFERENTIAL/PLATELET
Basophils Absolute: 0 10*3/uL (ref 0.0–0.1)
Basophils Relative: 0 %
Eosinophils Absolute: 0.2 10*3/uL (ref 0.0–0.7)
Eosinophils Relative: 2 %
HEMATOCRIT: 44.2 % (ref 39.0–52.0)
HEMOGLOBIN: 15.4 g/dL (ref 13.0–17.0)
LYMPHS ABS: 1.8 10*3/uL (ref 0.7–4.0)
LYMPHS PCT: 21 %
MCH: 31.4 pg (ref 26.0–34.0)
MCHC: 34.8 g/dL (ref 30.0–36.0)
MCV: 90 fL (ref 78.0–100.0)
MONOS PCT: 9 %
Monocytes Absolute: 0.8 10*3/uL (ref 0.1–1.0)
NEUTROS ABS: 5.8 10*3/uL (ref 1.7–7.7)
NEUTROS PCT: 68 %
Platelets: 295 10*3/uL (ref 150–400)
RBC: 4.91 MIL/uL (ref 4.22–5.81)
RDW: 13.7 % (ref 11.5–15.5)
WBC: 8.5 10*3/uL (ref 4.0–10.5)

## 2017-09-15 LAB — BASIC METABOLIC PANEL
Anion gap: 13 (ref 5–15)
BUN: 27 mg/dL — AB (ref 6–20)
CALCIUM: 9.2 mg/dL (ref 8.9–10.3)
CO2: 23 mmol/L (ref 22–32)
CREATININE: 1.01 mg/dL (ref 0.61–1.24)
Chloride: 98 mmol/L — ABNORMAL LOW (ref 101–111)
GFR calc non Af Amer: >60 mL/min (ref >60)
Glucose, Bld: 143 mg/dL — ABNORMAL HIGH (ref 65–99)
Potassium: 3.5 mmol/L (ref 3.5–5.1)
SODIUM: 134 mmol/L — AB (ref 135–145)

## 2017-09-15 LAB — MAGNESIUM: Magnesium: 2 mg/dL (ref 1.7–2.4)

## 2017-09-15 LAB — TROPONIN I
TROPONIN I: 0.18 ng/mL — AB (ref <0.03)
Troponin I: 0.16 ng/mL (ref <0.03)

## 2017-09-15 LAB — BRAIN NATRIURETIC PEPTIDE: B Natriuretic Peptide: 427.4 pg/mL — ABNORMAL HIGH (ref 0.0–100.0)

## 2017-09-15 MED ORDER — DONEPEZIL HCL 10 MG PO TABS
10.0000 mg | ORAL_TABLET | Freq: Every day | ORAL | Status: DC
  Administered 2017-09-15 – 2017-09-16 (×2): 10 mg via ORAL
  Filled 2017-09-15 (×2): qty 1

## 2017-09-15 MED ORDER — POLYVINYL ALCOHOL 1.4 % OP SOLN
1.0000 [drp] | OPHTHALMIC | Status: DC | PRN
  Filled 2017-09-15: qty 15

## 2017-09-15 MED ORDER — SODIUM CHLORIDE 0.9% FLUSH
3.0000 mL | Freq: Two times a day (BID) | INTRAVENOUS | Status: DC
  Administered 2017-09-16 – 2017-09-18 (×4): 3 mL via INTRAVENOUS

## 2017-09-15 MED ORDER — AMIODARONE HCL IN DEXTROSE 360-4.14 MG/200ML-% IV SOLN
60.0000 mg/h | INTRAVENOUS | Status: AC
  Administered 2017-09-15: 60 mg/h via INTRAVENOUS
  Filled 2017-09-15 (×2): qty 200

## 2017-09-15 MED ORDER — VALACYCLOVIR HCL 500 MG PO TABS
500.0000 mg | ORAL_TABLET | Freq: Every day | ORAL | Status: DC
  Administered 2017-09-16 – 2017-09-18 (×3): 500 mg via ORAL
  Filled 2017-09-15 (×3): qty 1

## 2017-09-15 MED ORDER — HYPROMELLOSE (GONIOSCOPIC) 2.5 % OP SOLN: 1.0000 [drp] | OPHTHALMIC | Status: DC | PRN

## 2017-09-15 MED ORDER — AMIODARONE HCL IN DEXTROSE 360-4.14 MG/200ML-% IV SOLN
30.0000 mg/h | INTRAVENOUS | Status: DC
  Administered 2017-09-16 (×2): 30 mg/h via INTRAVENOUS
  Filled 2017-09-15 (×3): qty 200

## 2017-09-15 MED ORDER — ENOXAPARIN SODIUM 40 MG/0.4ML ~~LOC~~ SOLN
40.0000 mg | SUBCUTANEOUS | Status: DC
Start: 2017-09-16 — End: 2017-09-18
  Administered 2017-09-16 – 2017-09-18 (×3): 40 mg via SUBCUTANEOUS
  Filled 2017-09-15 (×3): qty 0.4

## 2017-09-15 MED ORDER — AMIODARONE LOAD VIA INFUSION
150.0000 mg | Freq: Once | INTRAVENOUS | Status: AC
  Administered 2017-09-15: 150 mg via INTRAVENOUS
  Filled 2017-09-15: qty 83.34

## 2017-09-15 MED ORDER — AMLODIPINE BESYLATE 2.5 MG PO TABS
2.5000 mg | ORAL_TABLET | Freq: Every day | ORAL | Status: DC
  Administered 2017-09-16: 2.5 mg via ORAL
  Filled 2017-09-15: qty 1

## 2017-09-15 MED ORDER — ADULT MULTIVITAMIN W/MINERALS CH
1.0000 | ORAL_TABLET | Freq: Every day | ORAL | Status: DC
Start: 2017-09-15 — End: 2017-09-18
  Administered 2017-09-16 – 2017-09-18 (×4): 1 via ORAL
  Filled 2017-09-15 (×4): qty 1

## 2017-09-15 MED ORDER — LIDOCAINE 5 % EX PTCH: 1.0000 | MEDICATED_PATCH | Freq: Every day | CUTANEOUS | Status: DC | PRN

## 2017-09-15 MED ORDER — ATORVASTATIN CALCIUM 80 MG PO TABS
80.0000 mg | ORAL_TABLET | Freq: Every day | ORAL | Status: DC
  Administered 2017-09-16 – 2017-09-17 (×2): 80 mg via ORAL
  Filled 2017-09-15 (×2): qty 1

## 2017-09-15 MED ORDER — SODIUM CHLORIDE 0.9 % IV SOLN: 250.0000 mL | INTRAVENOUS | Status: DC | PRN

## 2017-09-15 MED ORDER — ASPIRIN 325 MG PO TABS: 325.0000 mg | ORAL_TABLET | ORAL | Status: DC | PRN

## 2017-09-15 MED ORDER — VITAMIN D 1000 UNITS PO TABS
2000.0000 [IU] | ORAL_TABLET | Freq: Every day | ORAL | Status: DC
  Administered 2017-09-16 – 2017-09-18 (×4): 2000 [IU] via ORAL
  Filled 2017-09-15 (×3): qty 2

## 2017-09-15 MED ORDER — SODIUM CHLORIDE 0.9% FLUSH: 3.0000 mL | INTRAVENOUS | Status: DC | PRN

## 2017-09-15 MED ORDER — NORTRIPTYLINE HCL 25 MG PO CAPS
75.0000 mg | ORAL_CAPSULE | Freq: Every day | ORAL | Status: DC
  Administered 2017-09-15 – 2017-09-17 (×2): 75 mg via ORAL
  Filled 2017-09-15 (×3): qty 3

## 2017-09-15 MED ORDER — ACETAMINOPHEN 325 MG PO TABS
650.0000 mg | ORAL_TABLET | Freq: Four times a day (QID) | ORAL | Status: DC | PRN
  Administered 2017-09-16 – 2017-09-18 (×6): 650 mg via ORAL
  Filled 2017-09-15 (×6): qty 2

## 2017-09-15 MED ORDER — ACETAMINOPHEN 325 MG PO TABS
650.0000 mg | ORAL_TABLET | Freq: Once | ORAL | Status: AC
  Administered 2017-09-15: 650 mg via ORAL
  Filled 2017-09-15: qty 2

## 2017-09-15 MED ORDER — LISINOPRIL 40 MG PO TABS
40.0000 mg | ORAL_TABLET | Freq: Every day | ORAL | Status: DC
  Administered 2017-09-16: 40 mg via ORAL
  Filled 2017-09-15: qty 1

## 2017-09-15 MED ORDER — ACETAMINOPHEN 650 MG RE SUPP: 650.0000 mg | Freq: Four times a day (QID) | RECTAL | Status: DC | PRN

## 2017-09-15 MED ORDER — AMLODIPINE BESYLATE 2.5 MG PO TABS: 2.5000 mg | ORAL_TABLET | Freq: Every day | ORAL | Status: DC

## 2017-09-15 MED ORDER — OLANZAPINE 10 MG PO TABS
10.0000 mg | ORAL_TABLET | Freq: Every evening | ORAL | Status: DC | PRN
  Filled 2017-09-15: qty 1

## 2017-09-15 NOTE — ED Notes (Signed)
Date and time results received: 09/15/17 6:28 PM  (use smartphrase ".now" to insert current time)  Test: TROPONIN Critical Value: 0.16  Name of Provider Notified: MD Mcmanus  Orders Received? Or Actions Taken?: Informed edp Mcmanus, waiting for orders

## 2017-09-15 NOTE — H&P (Signed)
TRH H&P   Patient Demographics:    Brian Hoover, is a 83 y.o. male  MRN: [id number]   DOB - [birth year]  Admit Date - 09/15/2017  Outpatient Primary MD for the patient is [name], MD  Referring MD/NP/PA:   [facility]  Outpatient Specialists:   Patient coming from:   home  Chief Complaint  Patient presents with  . Atrial Fibrillation      HPI:    Brian Hoover  is a 83 y.o. male,   w MGUS, Copd not on home o2, CHF (EF  35-40%), atrial fibrillation, apparently woke up at 4am with palpitations lasting about 2 hours,  Pt then later in the day continued to have symptoms of palpitations and therefore presented to ED. Slight nausea, slight dyspnea. Trace edema.   Pt denies fever, chills, cough, cp, orthopnea, pnd. Significant weight gain.   Pt does note about 1 week ago falling and breaking his right collar bone without syncope/ LOC>  Pt was seen by cardiology Dr. [name], and told might have had brief episode of afb.  Pt is on aspirin and not on anticoagulation cause had gum bleeding and rectal bleeding with xarelto ?  In ED, HR 200+,  CXR negative Na 134, K 3.5, Magnesium 2.0 BNP 427 Trop 0.16  Pt was placed on amiodarone Gtt, and converted to NSR. Troponin mild elevation 0.16,  Cardiology was contacted by ED and requested admission to [facility].        Review of systems:    In addition to the HPI above,  No Fever-chills, No Headache, No changes with Vision or hearing, No problems swallowing food or Liquids, No Chest pain, Cough  No Abdominal pain, No Vommitting, Bowel movements are regular, No Blood in stool or Urine, No dysuria, No new skin rashes or bruises, No new joints pains-aches,  No new weakness, tingling, numbness in any extremity, No recent weight gain or loss, No polyuria, polydypsia or polyphagia, No significant Mental Stressors.  A full 10 point Review  of Systems was done, except as stated above, all other Review of Systems were negative.   With Past History of the following :    Past Medical History:  Diagnosis Date  . Atrial fibrillation ([place])   . Back pain   . Bradycardia   . CHF (congestive heart failure) ([place])   . Chronic systolic heart failure ([place])   . COPD (chronic obstructive pulmonary disease) ([place])   . Dyspnea   . GERD (gastroesophageal reflux disease)   . MGUS (monoclonal gammopathy of unknown significance) 01/25/2015  . Migraine   . Pneumothorax 01/28/2013  . PVC (premature ventricular contraction)   . Syncope    s/p Medtronic ILR implant 05/2013 by Dr [name]      Past Surgical History:  Procedure Laterality Date  . CHEST TUBE INSERTION Right 01/29/2013  . CLAVICLE SURGERY    .  LOOP RECORDER IMPLANT  06/02/2013   Medtronic LinQ implanted for syncope by Dr [name]  . LOOP RECORDER IMPLANT N/A 06/02/2013   Procedure: LOOP RECORDER IMPLANT;  Surgeon: [name], MD;  Location[facility] C[facility] LAB;  Service: Cardiovascular;  Laterality: N/A;  . PROSTATE SURGERY    . SHOULDER ARTHROSCOPY    . TENDON REPAIR     right foot      Social History:     Social History  Substance Use Topics  . Smoking status: Former Smoker    Packs/day: 1.50    Years: 10.00    Types: Cigarettes    Quit date: 12/04/1960  . Smokeless tobacco: Never Used  . Alcohol use 6.0 oz/week    10 Shots of liquor per week     Comment: 4 Drinks a week     Lives - at home  Mobility - walks by self.   Family History :     Family History  Problem Relation Age of Onset  . Breast cancer Mother   . Uterine cancer Mother   . Rheum arthritis Son       Home Medications:   Prior to Admission medications   Medication Sig Start Date End Date Taking? Authorizing Provider  amLODipine (NORVASC) 2.5 MG tablet Take 1 tablet (2.5 mg total) by mouth daily. 06/18/17  Yes Jerline Pain, MD  aspirin 325 MG tablet Take 325 mg by mouth as needed for headache.    Yes [provider]  aspirin EC 81 MG tablet Take 81 mg by mouth daily.   Yes [provider]  cholecalciferol (VITAMIN D) 1000 UNITS tablet Take 2,000 Units by mouth daily.    Yes [provider]  cloNIDine (CATAPRES) 0.1 MG tablet Take 1 tablet (0.1 mg total) by mouth daily. As needed for systolic blood pressure > 160 mmHg 06/18/17  Yes Skains, Thana Farr, MD  donepezil (ARICEPT) 10 MG tablet Take 1 tablet (10 mg total) by mouth at bedtime. 05/03/17  Yes Jaffe, Adam R, DO  hydroxypropyl methylcellulose / hypromellose (ISOPTO TEARS / GONIOVISC) 2.5 % ophthalmic solution Place 1 drop into both eyes as needed for dry eyes.   Yes [provider]  ibuprofen (ADVIL,MOTRIN) 200 MG tablet Take 600 mg by mouth every 6 (six) hours as needed for moderate pain.   Yes [provider]  Lidocaine 4 % PTCH Apply 1 patch topically daily as needed (pain).   Yes [provider]  lisinopril (PRINIVIL,ZESTRIL) 40 MG tablet Take 1 tablet (40 mg total) by mouth daily. 10/11/16  Yes Jerline Pain, MD  Multiple Vitamins-Minerals (MULTIVITAMIN WITH MINERALS) tablet Take 1 tablet by mouth daily.   Yes [provider]  nortriptyline (PAMELOR) 25 MG capsule TAKE 3 CAPSULES BY MOUTH AT BEDTIME 06/11/17  Yes Jaffe, Adam R, DO  OLANZapine (ZYPREXA) 10 MG tablet Take 10 mg by mouth at bedtime as needed (as needed to ward off  cluster headaches.).    Yes [provider]  valACYclovir (VALTREX) 500 MG tablet Take 500 mg by mouth daily.   Yes [provider]  ibuprofen (ADVIL,MOTRIN) 600 MG tablet Take 1 tablet (600 mg total) by mouth every 8 (eight) hours as needed. Patient not taking: Reported on 09/15/2017 09/09/17   Charlesetta Shanks, MD     Allergies:     Allergies  Allergen Reactions  . Sulfa Antibiotics Hives    Hives      Physical Exam:   Vitals  Blood pressure  109/75, pulse (!) 211, resp. rate (!) 25, height 5\' 10"  (1.778 m), weight 78.9 kg  (174 lb), SpO2 94 %.   1. General  lying in bed in NAD,   2. Normal affect and insight, Not Suicidal or Homicidal, Awake Alert, Oriented X 3.  3. No F.N deficits, ALL C.Nerves Intact, Strength 5/5 all 4 extremities, Sensation intact all 4 extremities, Plantars down going.  4. Ears and Eyes appear Normal, Conjunctivae clear, PERRLA. Moist Oral Mucosa.  5. Supple Neck, No JVD, No cervical lymphadenopathy appriciated, No Carotid Bruits.  6. Symmetrical Chest wall movement, Good air movement bilaterally, CTAB.  7. RRR, s1, s2, 1/6 sem apex  8. Positive Bowel Sounds, Abdomen Soft, No tenderness, No organomegaly appriciated,No rebound -guarding or rigidity.  9.  No Cyanosis, Normal Skin Turgor, No Skin Rash or Bruise.  10. Good muscle tone,  joints appear normal , no effusions, Normal ROM.  11. No Palpable Lymph Nodes in Neck or Axillae     Data Review:    CBC  Recent Labs Lab 09/15/17 1647  WBC 8.5  HGB 15.4  HCT 44.2  PLT 295  MCV 90.0  MCH 31.4  MCHC 34.8  RDW 13.7  LYMPHSABS 1.8  MONOABS 0.8  EOSABS 0.2  BASOSABS 0.0   ------------------------------------------------------------------------------------------------------------------  Chemistries   Recent Labs Lab 09/15/17 1638 09/15/17 1722  NA 134*  --   K 3.5  --   CL 98*  --   CO2 23  --   GLUCOSE 143*  --   BUN 27*  --   CREATININE 1.01  --   CALCIUM 9.2  --   MG  --  2.0   ------------------------------------------------------------------------------------------------------------------ estimated creatinine clearance is 57.2 mL/min (by C-G formula based on SCr of 1.01 mg/dL). ------------------------------------------------------------------------------------------------------------------ No results for input(s): TSH, T4TOTAL, T3FREE, THYROIDAB in the last 72 hours.  Invalid input(s): FREET3  Coagulation profile No results for input(s): INR, PROTIME in the last 168  hours. ------------------------------------------------------------------------------------------------------------------- No results for input(s): DDIMER in the last 72 hours. -------------------------------------------------------------------------------------------------------------------  Cardiac Enzymes  Recent Labs Lab 09/15/17 1647  TROPONINI 0.16*   ------------------------------------------------------------------------------------------------------------------    Component Value Date/Time   BNP 427.4 (H) 09/15/2017 1647     ---------------------------------------------------------------------------------------------------------------  Urinalysis    Component Value Date/Time   COLORURINE YELLOW 11/22/2016 1925   APPEARANCEUR CLEAR 11/22/2016 1925   LABSPEC 1.014 11/22/2016 1925   PHURINE 6.5 11/22/2016 1925   GLUCOSEU NEGATIVE 11/22/2016 1925   HGBUR NEGATIVE 11/22/2016 1925   BILIRUBINUR NEGATIVE 11/22/2016 1925   KETONESUR NEGATIVE 11/22/2016 1925   PROTEINUR NEGATIVE 11/22/2016 1925   NITRITE NEGATIVE 11/22/2016 1925   LEUKOCYTESUR NEGATIVE 11/22/2016 1925    ----------------------------------------------------------------------------------------------------------------   Imaging Results:    Dg Chest Port 1 View  Result Date: 09/15/2017 CLINICAL DATA:  Chest palpitations.  History of COPD. EXAM: PORTABLE CHEST 1 VIEW COMPARISON:  PA and lateral chest 01/24/2017 PA and 07/29/2015. FINDINGS: The patient is rotated on the examination. The lungs appear emphysematous but are clear. Heart size is normal. Loop recorder is noted. The patient is status post fixation of left clavicle fracture. IMPRESSION: No acute disease. Electronically Signed   By: Inge Rise M.D.   On: 09/15/2017 17:03    Wide complex tachycardia likely due to RBBB Started on amiodarone   Assessment & Plan:    Principal Problem:   Atrial fibrillation with RVR (HCC) Active Problems:    Elevated troponin   Hyponatremia   Atrial fibrillation ([place])  Wide complex tachycardia Afib with RVR  Tele Trop I q6h x3 TSH Cardiac echo Continue amiodarone Cardiology consulted by ED, appreciate input.   Troponin elevation Trop I q6h x3 Check echo as above Check hga1c, lipid Aspirin, lipitor, lisinopril, amlodipine,  start carvedilol 3.125mg  po bid  CHF (EF 35-40%) Consider entresto  ? Dementia Continue Aricept Continue zyprexa  Hyponatremia Check cmp in am    DVT Prophylaxis Lovenox  - SCDs   AM Labs Ordered, also please review Full Orders  Family Communication: Admission, patients condition and plan of care including tests being ordered have been discussed with the patient  who indicate understanding and agree with the plan and Code Status.  Code Status FULL CODE  Likely DC to  home  Condition Critical  Consults called: cardiology by ED.   Admission status: inpatient  Time spent in minutes : 49  Critical care   Jani Gravel M.D on 09/15/2017 at 7:54 PM  Between 7am to 7pm - Pager - [phone]. After 7pm go to [email or web address] - password [facility]  [facility] - Office  [phone]

## 2017-09-15 NOTE — ED Notes (Signed)
ED Provider at bedside. 

## 2017-09-15 NOTE — Consult Note (Signed)
Reason for Consult: tachycardia/afib   Requesting Physician/Service: [facility] ED / [facility] Dr. Maudie Mercury   PCP:  [name], MD Primary Cardiologist:Taylor (EP)  HPI:  Brian Hoover is a 83 yo man with a remote PVC ablation, PAF on aspirin alone, falls, remote PTX, s/p chest tube after a fracture and . Followed by Dr. [name]; recently seen in the office for a fall and fracture. This was unexplained but possibly concerning for symptomatic bradycardia.   He presented to the ED with 2 separate episodes of palpitations and chest pain since this AM. Associated with sweating. In the ED, heart rates 200, wide complex RBBB morphology (similar to underlying rhythm). He was given IV amiodarone bolus and drip and per ED report, rate slowed (which was afib) and then converted to sinus rhythm with PVCs per report. Not on nodal blocking agents at home due to falls/bradycardia.  On interview, well appearing, tele with likely fib rate 70s.  On amiodarone drip.  No CP, SOB, edema.  He states his symptoms tonight (palpitations) are different than what he feels with his falls which is a weakness near syncope sensation.  No syncope tonight.  Previous Cardiac Studies: Echo 06/14/17  Study Conclusions  - Procedure narrative: Transthoracic echocardiography. Image  quality was adequate. Intravenous contrast (Definity) was  administered.  - Left ventricle: The cavity size was moderately dilated. Wall  thickness was normal. Systolic function was moderately reduced.  The estimated ejection fraction was in the range of 35% to 40%.  There is akinesis of the inferolateral myocardium. Doppler  parameters are consistent with abnormal left ventricular  relaxation (grade 1 diastolic dysfunction).  - Aortic valve: There was trivial regurgitation.  - Aortic root: The aortic root was mildly dilated.  - Mitral valve: Calcified annulus. There was mild regurgitation.  - Left atrium: The atrium was mildly dilated.  - Right ventricle:  The cavity size was mildly dilated.  - Atrial septum: There was an atrial septal aneurysm.  Impressions:  - Definity used; akinesis of the inferolateral wall with overall  moderately reduced LV function; mild diastolic dysfunction;  moderate LVE; trace AI; mildly dilated aortic root; mild MR; mild  LAE; mild RVE; mild TR.  06/15/17 Holter   Frequent PVC's (6430). 500 bigeminy. 600 [place].   Frequent PAC's (12988)   41 beats of wide complex tachycardia (page 23) at 172 BPM, can not exclude NSVT. No AFIB   Findings were discussed with patient and Dr. [name] by Truitt Merle, NP.   Will have follow up with EP, Dr. [name] and myself.   Past Medical History:  Diagnosis Date  . Atrial fibrillation ([place])   . Back pain   . Bradycardia   . CHF (congestive heart failure) ([place])   . Chronic systolic heart failure ([place])   . COPD (chronic obstructive pulmonary disease) ([place])   . Dyspnea   . GERD (gastroesophageal reflux disease)   . MGUS (monoclonal gammopathy of unknown significance) 01/25/2015  . Migraine   . Pneumothorax 01/28/2013  . PVC (premature ventricular contraction)   . Syncope    s/p Medtronic ILR implant 05/2013 by Dr [name]    Past Surgical History:  Procedure Laterality Date  . CHEST TUBE INSERTION Right 01/29/2013  . CLAVICLE SURGERY    . LOOP RECORDER IMPLANT  06/02/2013   Medtronic LinQ implanted for syncope by Dr [name]  . LOOP RECORDER IMPLANT N/A 06/02/2013   Procedure: LOOP RECORDER IMPLANT;  Surgeon: [name], MD;  Location[facility] CATH  LAB;  Service: Cardiovascular;  Laterality: N/A;  . PROSTATE SURGERY    . SHOULDER ARTHROSCOPY    . TENDON REPAIR     right foot    Family History  Problem Relation Age of Onset  . Breast cancer Mother   . Uterine cancer Mother   . Rheum arthritis Son    Social History:  reports that he quit smoking about 56 years ago. His smoking use included Cigarettes. He has a 15.00 pack-year smoking history. He has never used  smokeless tobacco. He reports that he drinks about 6.0 oz of alcohol per week . He reports that he does not use drugs.  Allergies:  Allergies  Allergen Reactions  . Sulfa Antibiotics Hives    Hives     No current facility-administered medications on file prior to encounter.    Current Outpatient Prescriptions on File Prior to Encounter  Medication Sig Dispense Refill  . amLODipine (NORVASC) 2.5 MG tablet Take 1 tablet (2.5 mg total) by mouth daily. 90 tablet 3  . aspirin 325 MG tablet Take 325 mg by mouth as needed for headache.    Marland Kitchen aspirin EC 81 MG tablet Take 81 mg by mouth daily.    . cholecalciferol (VITAMIN D) 1000 UNITS tablet Take 2,000 Units by mouth daily.     . cloNIDine (CATAPRES) 0.1 MG tablet Take 1 tablet (0.1 mg total) by mouth daily. As needed for systolic blood pressure > 160 mmHg 30 tablet 2  . donepezil (ARICEPT) 10 MG tablet Take 1 tablet (10 mg total) by mouth at bedtime. 90 tablet 2  . lisinopril (PRINIVIL,ZESTRIL) 40 MG tablet Take 1 tablet (40 mg total) by mouth daily. 90 tablet 3  . Multiple Vitamins-Minerals (MULTIVITAMIN WITH MINERALS) tablet Take 1 tablet by mouth daily.    . nortriptyline (PAMELOR) 25 MG capsule TAKE 3 CAPSULES BY MOUTH AT BEDTIME 270 capsule 1  . OLANZapine (ZYPREXA) 10 MG tablet Take 10 mg by mouth at bedtime as needed (as needed to ward off  cluster headaches.).     Marland Kitchen valACYclovir (VALTREX) 500 MG tablet Take 500 mg by mouth daily.    Marland Kitchen ibuprofen (ADVIL,MOTRIN) 600 MG tablet Take 1 tablet (600 mg total) by mouth every 8 (eight) hours as needed. (Patient not taking: Reported on 09/15/2017) 30 tablet 0    _0 @ _1 @  Results for orders placed or performed during the hospital encounter of 09/15/17 (from the past 48 hour(s))  Basic metabolic panel     Status: Abnormal   Collection Time: 09/15/17  4:38 PM  Result Value Ref Range   Sodium 134 (L) 135 - 145 mmol/L   Potassium 3.5 3.5 - 5.1 mmol/L   Chloride 98 (L) 101  - 111 mmol/L   CO2 23 22 - 32 mmol/L   Glucose, Bld 143 (H) 65 - 99 mg/dL   BUN 27 (H) 6 - 20 mg/dL   Creatinine, Ser 1.01 0.61 - 1.24 mg/dL   Calcium 9.2 8.9 - 10.3 mg/dL   GFR calc non Af Amer >60 >60 mL/min   GFR calc Af Amer >60 >60 mL/min    Comment: (NOTE) The eGFR has been calculated using the CKD EPI equation. This calculation has not been validated in all clinical situations. eGFR's persistently <60 mL/min signify possible Chronic Kidney Disease.    Anion gap 13 5 - 15  CBC with Differential/Platelet     Status: None   Collection Time: 09/15/17  4:47 PM  Result Value Ref Range  WBC 8.5 4.0 - 10.5 K/uL   RBC 4.91 4.22 - 5.81 MIL/uL   Hemoglobin 15.4 13.0 - 17.0 g/dL   HCT 44.2 39.0 - 52.0 %   MCV 90.0 78.0 - 100.0 fL   MCH 31.4 26.0 - 34.0 pg   MCHC 34.8 30.0 - 36.0 g/dL   RDW 13.7 11.5 - 15.5 %   Platelets 295 150 - 400 K/uL   Neutrophils Relative % 68 %   Neutro Abs 5.8 1.7 - 7.7 K/uL   Lymphocytes Relative 21 %   Lymphs Abs 1.8 0.7 - 4.0 K/uL   Monocytes Relative 9 %   Monocytes Absolute 0.8 0.1 - 1.0 K/uL   Eosinophils Relative 2 %   Eosinophils Absolute 0.2 0.0 - 0.7 K/uL   Basophils Relative 0 %   Basophils Absolute 0.0 0.0 - 0.1 K/uL  Troponin I     Status: Abnormal   Collection Time: 09/15/17  4:47 PM  Result Value Ref Range   Troponin I 0.16 (HH) <0.03 ng/mL    Comment: CRITICAL RESULT CALLED TO, READ BACK BY AND VERIFIED WITH: BOLAND,K RN [id number] COVINGTON,N   Brain natriuretic peptide     Status: Abnormal   Collection Time: 09/15/17  4:47 PM  Result Value Ref Range   B Natriuretic Peptide 427.4 (H) 0.0 - 100.0 pg/mL  Magnesium     Status: None   Collection Time: 09/15/17  5:22 PM  Result Value Ref Range   Magnesium 2.0 1.7 - 2.4 mg/dL  Troponin I (q 6hr x 3)     Status: Abnormal   Collection Time: 09/15/17  9:43 PM  Result Value Ref Range   Troponin I 0.18 (HH) <0.03 ng/mL    Comment: CRITICAL RESULT CALLED TO, READ BACK BY AND VERIFIED  WITH: BLOODWORTH K,RN 09/15/17 2313 WAYK    Dg Chest Port 1 View  Result Date: 09/15/2017 CLINICAL DATA:  Chest palpitations.  History of COPD. EXAM: PORTABLE CHEST 1 VIEW COMPARISON:  PA and lateral chest 01/24/2017 PA and 07/29/2015. FINDINGS: The patient is rotated on the examination. The lungs appear emphysematous but are clear. Heart size is normal. Loop recorder is noted. The patient is status post fixation of left clavicle fracture. IMPRESSION: No acute disease. Electronically Signed   By: Inge Rise M.D.   On: 09/15/2017 17:03    ECG/TELE: WCT 200s RBBB morphology (similar to underlying) Current Afib RBBB with intermittent aberrant beats  ROS: As above. Otherwise, review of systems is negative unless per above HPI  Vitals:   09/15/17 1900 09/15/17 1915 09/15/17 1930 09/15/17 2138  BP: 131/79  122/89 127/79  Pulse: 93 91 88 91  Resp: (!) 26 (!) 24 16   Temp:    98.5 F (36.9 C)  TempSrc:    Oral  SpO2: 96% 97% 97% 96%  Weight:    79.2 kg (174 lb 8 oz)  Height:       Wt Readings from Last 10 Encounters:  09/15/17 79.2 kg (174 lb 8 oz)  09/14/17 78.7 kg (173 lb 9.6 oz)  09/09/17 78.9 kg (174 lb)  07/03/17 81.6 kg (179 lb 12.8 oz)  06/18/17 80.7 kg (178 lb)  06/12/17 82.3 kg (181 lb 6.4 oz)  05/23/17 80.3 kg (177 lb)  02/09/17 80.9 kg (178 lb 7 oz)  01/04/17 80.9 kg (178 lb 6.4 oz)  12/15/16 79.5 kg (175 lb 3.2 oz)    PE:  General: No acute distress HEENT: Atraumatic, EOMI, mucous membranes moist. No  JVD at 35 degrees. No HJR. CV: iRiRR 1/6 SEM murmur, no gallops.  Respiratory: Clear, no crackles. Normal work of breathing ABD: Non-distended and non-tender. No palpable organomegaly.  Extremities: 2+ radial pulses bilaterally. no edema. Neuro/Psych: CN grossly intact, alert and oriented  Assessment/Plan A/P  Chronic presumed non-ischemic cardiomyopathy EF 40%; NYHA 1-2, controlled Paroxysmal AF  Rapid WCT to 200s,  HTN  Suspect as above WCT as likely  rapid fib or flutter.  (Would consider fascicular VT given RBBB and negative inferiorly but this is the same as his underlying axis/bundle).  Not on AC due to frequent falls.  In [facility] ED, Amio per report converted him to NSR, but here on tele I see what looks more like possibly an atypical or left sided flutter (flutter waves ~180 BPM maybe due to IV amiodarone).  He is asymptomatic.  Will repeat ECG, if flutter, perhaps he conducts rapidly when fib --> flutter and this can be treated with ablation.  Either way, with RBBB and LAFB, frequent falls and possible tachy-brady syndrome, he is possibly also headed towards a pacemaker to increase beta blocker.  Will discuss with EP.  Will continue to discuss anticoagulation strategy, likely needed but history of gum/rectal bleeding.  Continue home aspirin for now. Agree with TTE; amiodarone OK overnight likely d/c in AM.  Agree with chronic home anti-hypertensive medications.    Lolita Cram  [place]  MD 09/15/2017, 11:58 PM

## 2017-09-15 NOTE — ED Notes (Signed)
Bed: WA07 Expected date:  Expected time:  Means of arrival:  Comments: Rapid HR

## 2017-09-15 NOTE — ED Triage Notes (Signed)
Patient reports 2 episodes of afib in past 12 hours. Patient reports checks heart rate at home. This certain afib episode has lasted over hour. Patient denies any pain or SOB at this time.

## 2017-09-15 NOTE — ED Notes (Signed)
This RN verified EMTALA

## 2017-09-15 NOTE — ED Provider Notes (Signed)
[facility] DEPT Provider Note   CSN: [id number] Arrival date & time: 09/15/17  1619     History   Chief Complaint Chief Complaint  Patient presents with  . Atrial Fibrillation    HPI Brian Hoover is a 83 y.o. male.  The history is provided by the patient and the spouse. The history is limited by the condition of the patient (urgent need for intervention).  Atrial Fibrillation   Pt was seen at 1640.  Per pt and his wife, c/o sudden onset and persistence of constant palpitations that has occurred twice since 0310 this morning. Pt states he woke up from sleep with left sided chest "pain," diaphoresis and palpitations. Pt states he "knew it was my afib." Pt states this lasted 1 hour until it spontaneously resolved. Pt's symptoms re-occurred approximately 1510 this afternoon and did not resolve after 1 hour, so he came to the ED for evaluation. Pt states he fell 1 week ago and fx right clavicle. Pt states his "legs gave way" and that was why he fell. Pt was evaluated by his Cards MD yesterday and this fall was discussed:  Cards MD was concerned regarding possible symptomatic bradycardia as cause for the fall and pt was offered 4 week monitor. Pt denies SOB/cough, no abd pain, no N/V/D, no back pain, no new falls.   Past Medical History:  Diagnosis Date  . Atrial fibrillation ([place])   . Back pain   . Bradycardia   . CHF (congestive heart failure) ([place])   . Chronic systolic heart failure ([place])   . COPD (chronic obstructive pulmonary disease) ([place])   . Dyspnea   . GERD (gastroesophageal reflux disease)   . MGUS (monoclonal gammopathy of unknown significance) 01/25/2015  . Migraine   . Pneumothorax 01/28/2013  . PVC (premature ventricular contraction)   . Syncope    s/p Medtronic ILR implant 05/2013 by Dr [name]    Patient Active Problem List   Diagnosis Date Noted  . Obstructive chronic bronchitis with exacerbation ([place]) 01/05/2017  . Chronic anticoagulation 10/11/2015  .  Atrial fibrillation ([place]) 09/15/2015  . Sinus node dysfunction ([place]) 09/15/2015  . Cough 08/01/2015  . Cephalalgia 04/26/2015  . MGUS (monoclonal gammopathy of unknown significance) 01/25/2015  . Essential hypertension 11/03/2013  . Dizziness and giddiness 05/08/2013  . Near syncope 05/08/2013  . Right foot drop 03/11/2013  . Unspecified constipation 02/04/2013  . Traumatic pneumothorax (Right) 01/29/2013  . Multiple rib fractures (Right) 01/29/2013  . Syncope 10/29/2012  . Pruritus 01/20/2012  . PVC's (premature ventricular contractions) 09/20/2009  . Chronic systolic heart failure ([place]) 09/20/2009  . BRADYCARDIA 09/01/2009  . UPPER RESPIRATORY INFECTION, ACUTE 09/01/2009  . Centrilobular emphysema ([place]) 05/04/2009  . DYSPNEA 03/22/2009    Past Surgical History:  Procedure Laterality Date  . CHEST TUBE INSERTION Right 01/29/2013  . CLAVICLE SURGERY    . LOOP RECORDER IMPLANT  06/02/2013   Medtronic LinQ implanted for syncope by Dr [name]  . LOOP RECORDER IMPLANT N/A 06/02/2013   Procedure: LOOP RECORDER IMPLANT;  Surgeon: [name], MD;  Location: [facility] CATH LAB;  Service: Cardiovascular;  Laterality: N/A;  . PROSTATE SURGERY    . SHOULDER ARTHROSCOPY    . TENDON REPAIR     right foot       Home Medications    Prior to Admission medications   Medication Sig Start Date End Date Taking? Authorizing Provider  amLODipine (NORVASC) 2.5 MG tablet Take 1 tablet (2.5 mg total) by mouth daily.  06/18/17  Yes Jerline Pain, MD  aspirin 325 MG tablet Take 325 mg by mouth as needed for headache.   Yes [provider]  aspirin EC 81 MG tablet Take 81 mg by mouth daily.   Yes [provider]  cholecalciferol (VITAMIN D) 1000 UNITS tablet Take 2,000 Units by mouth daily.    Yes [provider]  cloNIDine (CATAPRES) 0.1 MG tablet Take 1 tablet (0.1 mg total) by mouth daily. As needed for systolic blood pressure > 160 mmHg 06/18/17  Yes Skains, Thana Farr, MD    donepezil (ARICEPT) 10 MG tablet Take 1 tablet (10 mg total) by mouth at bedtime. 05/03/17  Yes Jaffe, Adam R, DO  hydroxypropyl methylcellulose / hypromellose (ISOPTO TEARS / GONIOVISC) 2.5 % ophthalmic solution Place 1 drop into both eyes as needed for dry eyes.   Yes [provider]  ibuprofen (ADVIL,MOTRIN) 200 MG tablet Take 600 mg by mouth every 6 (six) hours as needed for moderate pain.   Yes [provider]  Lidocaine 4 % PTCH Apply 1 patch topically daily as needed (pain).   Yes [provider]  lisinopril (PRINIVIL,ZESTRIL) 40 MG tablet Take 1 tablet (40 mg total) by mouth daily. 10/11/16  Yes Jerline Pain, MD  Multiple Vitamins-Minerals (MULTIVITAMIN WITH MINERALS) tablet Take 1 tablet by mouth daily.   Yes [provider]  nortriptyline (PAMELOR) 25 MG capsule TAKE 3 CAPSULES BY MOUTH AT BEDTIME 06/11/17  Yes Jaffe, Adam R, DO  OLANZapine (ZYPREXA) 10 MG tablet Take 10 mg by mouth at bedtime as needed (as needed to ward off  cluster headaches.).    Yes [provider]  valACYclovir (VALTREX) 500 MG tablet Take 500 mg by mouth daily.   Yes [provider]  ibuprofen (ADVIL,MOTRIN) 600 MG tablet Take 1 tablet (600 mg total) by mouth every 8 (eight) hours as needed. Patient not taking: Reported on 09/15/2017 09/09/17   Charlesetta Shanks, MD    Family History Family History  Problem Relation Age of Onset  . Breast cancer Mother   . Uterine cancer Mother   . Rheum arthritis Son     Social History Social History  Substance Use Topics  . Smoking status: Former Smoker    Packs/day: 1.50    Years: 10.00    Types: Cigarettes    Quit date: 12/04/1960  . Smokeless tobacco: Never Used  . Alcohol use 6.0 oz/week    10 Shots of liquor per week     Comment: 4 Drinks a week     Allergies   Sulfa antibiotics   Review of Systems Review of Systems  Unable to perform ROS: Acuity of condition     Physical Exam Updated Vital  Signs BP 121/77   Pulse 99   Resp (!) 26   Ht 5\' 10"  (1.778 m)   Wt 78.9 kg (174 lb)   SpO2 95%   BMI 24.97 kg/m    Patient Vitals for the past 24 hrs:  BP Pulse Resp SpO2 Height Weight  09/15/17 1700 121/77 99 (!) 26 95 % - -  09/15/17 1627 - - - - 5\' 10"  (1.778 m) 78.9 kg (174 lb)     Physical Exam 1645: Physical examination:  Nursing notes reviewed; Vital signs and O2 SAT reviewed;  Constitutional: Well developed, Well nourished, Well hydrated, In no acute distress; Head:  Normocephalic, atraumatic; Eyes: EOMI, PERRL, No scleral icterus; ENMT: Mouth and pharynx normal, Mucous membranes moist; Neck: Supple, Full  range of motion, No lymphadenopathy; Cardiovascular: Tachycardic rate and regular rhythm, No gallop; Respiratory: Breath sounds clear & equal bilaterally, No wheezes.  Speaking full sentences with ease, Normal respiratory effort/excursion; Chest: +right clavicle TTP (known fracture), +fading ecchymosis right chest wall. Chest wall otherwise nontender, Movement normal; Abdomen: Soft, Nontender, Nondistended, Normal bowel sounds; Genitourinary: No CVA tenderness; Extremities: Pulses normal, No tenderness, No edema, No calf edema or asymmetry.; Neuro: AA&Ox3, Major CN grossly intact.  Speech clear. No gross focal motor or sensory deficits in extremities.; Skin: Color normal, Warm, Dry.   ED Treatments / Results  Labs (all labs ordered are listed, but only abnormal results are displayed)   EKG  EKG Interpretation  Date/Time:  Saturday September 15 2017 16:28:28 EDT Ventricular Rate:  208 PR Interval:    QRS Duration: 148 QT Interval:  271 QTC Calculation: 505 R Axis:   -104 Text Interpretation:  Extreme tachycardia with wide complex Vtach?   Significant change from previous Confirmed by Virgel Manifold [phone]) on 09/15/2017 4:33:36 PM       EKG Interpretation  Date/Time:  Saturday September 15 2017 17:14:33 EDT Ventricular Rate:  84 PR Interval:    QRS Duration: 174 QT  Interval:  412 QTC Calculation: 487 R Axis:   -79 Text Interpretation:  Normal sinus rhythm Predominant 2:1 AV block ? Multiform ventricular premature complexes RBBB and LAFB Normal sinus rhythm has replaced Wide QRS tachycardia Confirmed by Francine Graven [id number]) on 09/15/2017 5:27:29 PM         Radiology   Procedures Procedures (including critical care time)  Medications Ordered in ED Medications  amiodarone (NEXTERONE) 1.8 mg/mL load via infusion 150 mg (150 mg Intravenous Bolus from Bag 09/15/17 1700)    Followed by  amiodarone (NEXTERONE PREMIX) 360-4.14 MG/200ML-% (1.8 mg/mL) IV infusion (60 mg/hr Intravenous New Bag/Given 09/15/17 1702)    Followed by  amiodarone (NEXTERONE PREMIX) 360-4.14 MG/200ML-% (1.8 mg/mL) IV infusion (not administered)     Initial Impression / Assessment and Plan / ED Course  I have reviewed the triage vital signs and the nursing notes.  Pertinent labs & imaging results that were available during my care of the patient were reviewed by me and considered in my medical decision making (see chart for details).  MDM Reviewed: previous chart, nursing note and vitals Reviewed previous: labs and ECG Interpretation: labs, ECG and x-ray Total time providing critical care: 30-74 minutes. This excludes time spent performing separately reportable procedures and services. Consults: cardiology   CRITICAL CARE Performed by: Alfonzo Feller Total critical care time: 35 minutes Critical care time was exclusive of separately billable procedures and treating other patients. Critical care was necessary to treat or prevent imminent or life-threatening deterioration. Critical care was time spent personally by me on the following activities: development of treatment plan with patient and/or surrogate as well as nursing, discussions with consultants, evaluation of patient's response to treatment, examination of patient, obtaining history from patient or  surrogate, ordering and performing treatments and interventions, ordering and review of laboratory studies, ordering and review of radiographic studies, pulse oximetry and re-evaluation of patient's condition.   ED ECG REPORT   Date: 09/15/2017  1628  Rate: 208  Rhythm: WCT  QRS Axis: left  Intervals: QT prolonged  ST/T Wave abnormalities: nonspecific ST/T changes  Conduction Disutrbances:right bundle branch block  Narrative Interpretation:   Old EKG Reviewed: changes noted; previous EKG dated 05/23/2017 was NSR with first degree AVB, RBBB, LAFB  ED ECG REPORT   Date:  09/15/2017  1714  Rate: 84  Rhythm: normal sinus rhythm and premature ventricular contractions (PVC)  QRS Axis: left  Intervals: QT prolonged  ST/T Wave abnormalities: normal  Conduction Disutrbances:right bundle branch block and left anterior fascicular block  Narrative Interpretation:   Old EKG Reviewed: changes noted, 2nd EKG now NSR with PVCs   Results for orders placed or performed during the hospital encounter of 32/20/25  Basic metabolic panel  Result Value Ref Range   Sodium 134 (L) 135 - 145 mmol/L   Potassium 3.5 3.5 - 5.1 mmol/L   Chloride 98 (L) 101 - 111 mmol/L   CO2 23 22 - 32 mmol/L   Glucose, Bld 143 (H) 65 - 99 mg/dL   BUN 27 (H) 6 - 20 mg/dL   Creatinine, Ser 1.01 0.61 - 1.24 mg/dL   Calcium 9.2 8.9 - 10.3 mg/dL   GFR calc non Af Amer >60 >60 mL/min   GFR calc Af Amer >60 >60 mL/min   Anion gap 13 5 - 15  CBC with Differential/Platelet  Result Value Ref Range   WBC 8.5 4.0 - 10.5 K/uL   RBC 4.91 4.22 - 5.81 MIL/uL   Hemoglobin 15.4 13.0 - 17.0 g/dL   HCT 44.2 39.0 - 52.0 %   MCV 90.0 78.0 - 100.0 fL   MCH 31.4 26.0 - 34.0 pg   MCHC 34.8 30.0 - 36.0 g/dL   RDW 13.7 11.5 - 15.5 %   Platelets 295 150 - 400 K/uL   Neutrophils Relative % 68 %   Neutro Abs 5.8 1.7 - 7.7 K/uL   Lymphocytes Relative 21 %   Lymphs Abs 1.8 0.7 - 4.0 K/uL   Monocytes Relative 9 %   Monocytes Absolute 0.8  0.1 - 1.0 K/uL   Eosinophils Relative 2 %   Eosinophils Absolute 0.2 0.0 - 0.7 K/uL   Basophils Relative 0 %   Basophils Absolute 0.0 0.0 - 0.1 K/uL  Troponin I  Result Value Ref Range   Troponin I 0.16 (HH) <0.03 ng/mL  Brain natriuretic peptide  Result Value Ref Range   B Natriuretic Peptide 427.4 (H) 0.0 - 100.0 pg/mL  Magnesium  Result Value Ref Range   Magnesium 2.0 1.7 - 2.4 mg/dL   Dg Chest Port 1 View Result Date: 09/15/2017 CLINICAL DATA:  Chest palpitations.  History of COPD. EXAM: PORTABLE CHEST 1 VIEW COMPARISON:  PA and lateral chest 01/24/2017 PA and 07/29/2015. FINDINGS: The patient is rotated on the examination. The lungs appear emphysematous but are clear. Heart size is normal. Loop recorder is noted. The patient is status post fixation of left clavicle fracture. IMPRESSION: No acute disease. Electronically Signed   By: Inge Rise M.D.   On: 09/15/2017 17:03    1845:  On arrival: pt with WCT, rates in 200's.  IV amiodarone bolus and gtt given with HR trending downward to 90's. Pt's HR remains 80-90's, NSR on monitor. Pt states he feels "better." Troponin elevated; pt states he has already taken ASA today.  BNP elevated, but no overt CHF on CXR. Dx and testing d/w pt and family.  Questions answered.  Verb understanding, agreeable to admit. T/C to [facility] Cards Dr. Lamona Curl, case discussed, including:  HPI, pertinent PM/SHx, VS/PE, dx testing, ED course and treatment:  Agreeable to consult, requests to admit to Hospitalist service to [facility]   1910:  T/C to [facility] Dr. Maudie Mercury, case discussed, including:  HPI, pertinent PM/SHx, VS/PE, dx testing, ED course and treatment:  Agreeable  to admit.    Final Clinical Impressions(s) / ED Diagnoses   Final diagnoses:  None    New Prescriptions New Prescriptions   No medications on file      Francine Graven, DO 09/16/17 2307

## 2017-09-16 ENCOUNTER — Other Ambulatory Visit: Payer: Self-pay

## 2017-09-16 ENCOUNTER — Inpatient Hospital Stay (HOSPITAL_COMMUNITY): Payer: Medicare Other

## 2017-09-16 ENCOUNTER — Encounter (HOSPITAL_COMMUNITY): Payer: Self-pay | Admitting: *Deleted

## 2017-09-16 DIAGNOSIS — I453 Trifascicular block: Principal | ICD-10-CM

## 2017-09-16 DIAGNOSIS — I4891 Unspecified atrial fibrillation: Secondary | ICD-10-CM

## 2017-09-16 DIAGNOSIS — R748 Abnormal levels of other serum enzymes: Secondary | ICD-10-CM

## 2017-09-16 LAB — ECHOCARDIOGRAM COMPLETE
HEIGHTINCHES: 70 in
WEIGHTICAEL: 2752 [oz_av]

## 2017-09-16 LAB — COMPREHENSIVE METABOLIC PANEL
ALT: 22 U/L (ref 17–63)
AST: 22 U/L (ref 15–41)
Albumin: 3.1 g/dL — ABNORMAL LOW (ref 3.5–5.0)
Alkaline Phosphatase: 66 U/L (ref 38–126)
Anion gap: 10 (ref 5–15)
BUN: 23 mg/dL — ABNORMAL HIGH (ref 6–20)
CHLORIDE: 101 mmol/L (ref 101–111)
CO2: 22 mmol/L (ref 22–32)
CREATININE: 0.84 mg/dL (ref 0.61–1.24)
Calcium: 8.4 mg/dL — ABNORMAL LOW (ref 8.9–10.3)
GFR calc Af Amer: >60 mL/min (ref >60)
Glucose, Bld: 93 mg/dL (ref 65–99)
Potassium: 3.6 mmol/L (ref 3.5–5.1)
Sodium: 133 mmol/L — ABNORMAL LOW (ref 135–145)
Total Bilirubin: 0.6 mg/dL (ref 0.3–1.2)
Total Protein: 5.6 g/dL — ABNORMAL LOW (ref 6.5–8.1)

## 2017-09-16 LAB — LIPID PANEL
CHOL/HDL RATIO: 2.7 ratio
Cholesterol: 172 mg/dL (ref 0–200)
HDL: 64 mg/dL (ref >40)
LDL Cholesterol: 93 mg/dL (ref 0–99)
Triglycerides: 75 mg/dL (ref <150)
VLDL: 15 mg/dL (ref 0–40)

## 2017-09-16 LAB — CBC
HCT: 40.5 % (ref 39.0–52.0)
Hemoglobin: 13.7 g/dL (ref 13.0–17.0)
MCH: 30.4 pg (ref 26.0–34.0)
MCHC: 33.8 g/dL (ref 30.0–36.0)
MCV: 90 fL (ref 78.0–100.0)
PLATELETS: 255 10*3/uL (ref 150–400)
RBC: 4.5 MIL/uL (ref 4.22–5.81)
RDW: 13.7 % (ref 11.5–15.5)
WBC: 5.6 10*3/uL (ref 4.0–10.5)

## 2017-09-16 LAB — HEMOGLOBIN A1C
HEMOGLOBIN A1C: 5.8 % — AB (ref 4.8–5.6)
MEAN PLASMA GLUCOSE: 119.76 mg/dL

## 2017-09-16 LAB — TROPONIN I
TROPONIN I: 0.12 ng/mL — AB (ref <0.03)
TROPONIN I: 0.11 ng/mL — AB (ref <0.03)

## 2017-09-16 MED ORDER — ASPIRIN EC 81 MG PO TBEC
81.0000 mg | DELAYED_RELEASE_TABLET | Freq: Every day | ORAL | Status: DC
  Administered 2017-09-16 – 2017-09-18 (×3): 81 mg via ORAL
  Filled 2017-09-16 (×3): qty 1

## 2017-09-16 MED ORDER — CARVEDILOL 3.125 MG PO TABS
3.1250 mg | ORAL_TABLET | Freq: Two times a day (BID) | ORAL | Status: DC
  Administered 2017-09-16 – 2017-09-18 (×4): 3.125 mg via ORAL
  Filled 2017-09-16 (×6): qty 1

## 2017-09-16 MED ORDER — PERFLUTREN LIPID MICROSPHERE
1.0000 mL | INTRAVENOUS | Status: AC | PRN
  Filled 2017-09-16: qty 10

## 2017-09-16 NOTE — Consult Note (Signed)
ELECTROPHYSIOLOGY CONSULT NOTE    Primary Care Physician: [name], MD Referring Physician:  Dr Lamona Curl  Admit Date: 09/15/2017  Reason for consultation:  afib with RVR  Brian Hoover is a 83 y.o. male with a h/o persistent atrial fibrillation who now presents with presyncope and tachypalpitations.  He is found to have wide complex tachycardia upon presentation.  He was seen 09/14/17 by Dr [name] and had just had a fall during which his legs "gave way" and he sustained a clavicular fracture.  There was some concern about symptomatic arrhythmias at the time.  The patient has had prior syncope and also has chronic trifascicular block.  He did not have an ekg in the office this past week or with his fall/ clavicular fracture in the ED. He now presents with presyncope, dizziness, diaphoresis, and tachy palpitations.  He was noted in the ED to have a very regular wide complex tachycardia at over 200 bpm.  He received IV amiodarone and his heart rates slowed, revealing atrial fibrillation.  Previously, he has been on no AV nodal agents due to falls/ bradycardia.  He has also not tolerated anticoagulation with xarelto previously and is not on anticoagulation despite chads2vasc score of at least 4.  (age, HTN, EF 35-40%).  Per Dr [name] note 06/18/17, he has a h/o remote mid brain hemorrhage. He is s/p prior ILR for syncope though review of transmission 09/03/16 reveals RRT at that time.  Pt preferred to not have the device removed.  Today, he feels "much better".  He denies symptoms of palpitations, chest pain, shortness of breath, orthopnea, PND, lower extremity edema, dizziness, presyncope, syncope, or neurologic sequela. The patient is tolerating medications without difficulties and is otherwise without complaint today.   Past Medical History:  Diagnosis Date  . Atrial fibrillation ([place])   . Back pain   . Bradycardia   . CHF (congestive heart failure) ([place])   . Chronic systolic heart failure  ([place])   . COPD (chronic obstructive pulmonary disease) ([place])   . GERD (gastroesophageal reflux disease)   . Hypertension   . MGUS (monoclonal gammopathy of unknown significance) 01/25/2015  . Migraine   . Pneumothorax 01/28/2013  . PVC (premature ventricular contraction)   . Syncope    s/p Medtronic ILR implant 05/2013 by Dr [name]  . Trifascicular block    Past Surgical History:  Procedure Laterality Date  . CHEST TUBE INSERTION Right 01/29/2013  . CLAVICLE SURGERY    . LOOP RECORDER IMPLANT  06/02/2013   Medtronic LinQ implanted for syncope by Dr [name]  . LOOP RECORDER IMPLANT N/A 06/02/2013   Procedure: LOOP RECORDER IMPLANT;  Surgeon: [name], MD;  Location: [facility] CATH LAB;  Service: Cardiovascular;  Laterality: N/A;  . PROSTATE SURGERY    . SHOULDER ARTHROSCOPY    . TENDON REPAIR     right foot    . amLODipine  2.5 mg Oral Daily  . aspirin EC  81 mg Oral Daily  . atorvastatin  80 mg Oral q1800  . carvedilol  3.125 mg Oral BID WC  . cholecalciferol  2,000 Units Oral Daily  . donepezil  10 mg Oral QHS  . enoxaparin (LOVENOX) injection  40 mg Subcutaneous Q24H  . lisinopril  40 mg Oral Daily  . multivitamin with minerals  1 tablet Oral Daily  . nortriptyline  75 mg Oral QHS  . sodium chloride flush  3 mL Intravenous Q12H  . valACYclovir  500 mg Oral Daily   .  sodium chloride    . amiodarone 30 mg/hr (09/16/17 0434)    Allergies  Allergen Reactions  . Sulfa Antibiotics Hives    Hives     Social History   Social History  . Marital status: Married    Spouse name: Brian Hoover  . Number of children: 2  . Years of education: MA   Occupational History  . retired Chief Financial Officer    Social History Main Topics  . Smoking status: Former Smoker    Packs/day: 1.50    Years: 10.00    Types: Cigarettes    Quit date: 12/04/1960  . Smokeless tobacco: Never Used  . Alcohol use 6.0 oz/week    10 Shots of liquor per week     Comment: 4 Drinks a week  . Drug use: No  . Sexual  activity: Yes    Partners: Female    Birth control/ protection: None   Other Topics Concern  . Not on file   Social History Narrative      Pt lives at home with his spouse.   Caffeine Use: 2 cups daily.    Family History  Problem Relation Age of Onset  . Breast cancer Mother   . Uterine cancer Mother   . Rheum arthritis Son     ROS- All systems are reviewed and negative except as per the HPI above  Physical Exam: Telemetry: Vitals:   09/16/17 0434 09/16/17 0606 09/16/17 0706 09/16/17 0844  BP:  (!) 155/96 (!) 126/91 122/87  Pulse:  69 76 92  Resp:    17  Temp:    98 F (36.7 C)  TempSrc:    Axillary  SpO2:  96% 96% 97%  Weight: 172 lb (78 kg)     Height:        GEN- The patient is elderly appearing, alert and oriented x 3 today.   Head- normocephalic, atraumatic Eyes-  Sclera clear, conjunctiva pink Ears- hearing intact Oropharynx- clear Neck- supple,  Lungs- Clear to ausculation bilaterally, normal work of breathing Heart- irregular rate and rhythm  GI- soft, NT, ND, + BS Extremities- no clubbing, cyanosis, or edema MS- no significant deformity or atrophy Skin- + ecchymosis along chest wall Psych- euthymic mood, full affect Neuro- strength and sensation are intact  EKG from yesterday reveals RBB/LAHB tachycardia (likely a 1:1 atrial flutter) ekg from 05/23/17 reveals sinus rhythm with markedly prolonged first degree AV block, RBBB, LAHB  Labs:   Lab Results  Component Value Date   WBC 5.6 09/16/2017   HGB 13.7 09/16/2017   HCT 40.5 09/16/2017   MCV 90.0 09/16/2017   PLT 255 09/16/2017     Recent Labs Lab 09/16/17 0334  NA 133*  K 3.6  CL 101  CO2 22  BUN 23*  CREATININE 0.84  CALCIUM 8.4*  PROT 5.6*  BILITOT 0.6  ALKPHOS 66  ALT 22  AST 22  GLUCOSE 93   Lab Results  Component Value Date   TROPONINI 0.12 (HH) 09/16/2017    Lab Results  Component Value Date   CHOL 172 09/16/2017   Lab Results  Component Value Date   HDL 64  09/16/2017   Lab Results  Component Value Date   LDLCALC 93 09/16/2017   Lab Results  Component Value Date   TRIG 75 09/16/2017   Lab Results  Component Value Date   CHOLHDL 2.7 09/16/2017   No results found for: LDLDIRECT     Echo 06/14/17 reveals EF 35-40%  ASSESSMENT AND PLAN:  1. WCT Appears to be a 1:1 supraventricular tachycardia, likely atrial flutter with aberrancy.  I do not feel that this is VT. He has been placed on amiodarone with significant improvements in heart rate and transitional improvement in aberrancy.  2. Atrial fibrillation/ atrial flutter Options are somewhat limited given his reluctance to consider anticoagulation and also his intrinsic conduction system degenerative disease. Given chads2vasc score of 4, I did discuss anticoagulation today with patient and family.  They quickly became agitated and were clear that they had no interests in consideration of anticoagulation.  Per Dr [name] notes, he has a h/o prior ICH. Continue IV amiodarone for rate control and convert to oral as able Ultimately, rate control vs rhythm control with amiodarone are his options.  3. trivascicular block with syncope and afib with RVR Very complicated situation.  I think given prior syncope and trifascicular block, PPM is warranted.  This will allow for rate control of his atrial arrhythmias.  I would that if he converts to sinus, he will have worsening AV conduction on amiodarone. I think that he may also be a candidate for AV nodal ablation down the road if we are unable to adequately control his arrhythmias with amiodarone/ AV nodal agents post PPM. I would advise biventricular pacemaker given his high likelihood for ventricular pacing and even possibly AV nodal ablation down the road.  4. Chronic systolic dysfunction Consider CRT-P device  (as above).  Given his advanced age, I would not be excited about an ICD in him even with reduced EF and prior syncope.  5. Elevated  troponin Demand ischemia from heart rates of 200 bpm. Will tread underlying arrhythmia as above  I will ask Dr Caryl Comes to see the patient in the am for further discussions about possible device implantation during the day tomorrow.  I do not think that further long term monitoring would be of any benefit to this patient.  Very complicated patient at risk of decompensation, further arrhythmias and syncope.  A high level of decision making was required for this encounter.  Thompson Grayer, MD 09/16/2017  11:08 AM

## 2017-09-16 NOTE — Progress Notes (Signed)
*  PRELIMINARY RESULTS* Echocardiogram 2D Echocardiogram with definity has been performed.  [name] 09/16/2017, 1:00 PM

## 2017-09-16 NOTE — Progress Notes (Signed)
Electrophysiology Rounding Note  Patient Name: Brian Hoover Date of Encounter: 09/17/2017  Primary Cardiologist: [name] Electrophysiologist: [name]   Subjective   The patient is doing well today.  At this time, the patient denies chest pain, shortness of breath. +persistent non-productive cough (since fracture), right chest pain (collarbone fracture). No dizziness  Inpatient Medications    Scheduled Meds: . aspirin EC  81 mg Oral Daily  . atorvastatin  80 mg Oral q1800  . carvedilol  3.125 mg Oral BID WC  . cholecalciferol  2,000 Units Oral Daily  . donepezil  10 mg Oral QHS  . enoxaparin (LOVENOX) injection  40 mg Subcutaneous Q24H  . multivitamin with minerals  1 tablet Oral Daily  . nortriptyline  75 mg Oral QHS  . sodium chloride flush  3 mL Intravenous Q12H  . valACYclovir  500 mg Oral Daily   Continuous Infusions: . sodium chloride    . amiodarone 30 mg/hr (09/16/17 1619)   PRN Meds: sodium chloride, acetaminophen **OR** acetaminophen, lidocaine, OLANZapine, polyvinyl alcohol, sodium chloride flush   Vital Signs    Vitals:   09/16/17 1249 09/16/17 1920 09/17/17 0112 09/17/17 0535  BP: 101/77 118/71 (!) 137/92 135/80  Pulse: 89 95 90 (!) 53  Resp: 18     Temp: 97.7 F (36.5 C) 97.9 F (36.6 C) 97.7 F (36.5 C) (!) 97.5 F (36.4 C)  TempSrc: Axillary Oral Axillary Oral  SpO2: 96% 96% 96% 97%  Weight:    167 lb 14.4 oz (76.2 kg)  Height:        Intake/Output Summary (Last 24 hours) at 09/17/17 0737 Last data filed at 09/17/17 0230  Gross per 24 hour  Intake            390.5 ml  Output              900 ml  Net           -509.5 ml   Filed Weights   09/15/17 2138 09/16/17 0434 09/17/17 0535  Weight: 174 lb 8 oz (79.2 kg) 172 lb (78 kg) 167 lb 14.4 oz (76.2 kg)    Physical Exam    GEN- The patient is elderly appearing, alert and oriented x 3 today.   Head- normocephalic, atraumatic Eyes-  Sclera clear, conjunctiva pink Ears- hearing  intact Oropharynx- clear Neck- supple Lungs- Clear to ausculation bilaterally, normal work of breathing Heart- Irregular rate and rhythm  GI- soft, NT, ND, + BS Extremities- no clubbing, cyanosis, or edema Skin- no rash or lesion,+ ecchymosis right chest  Psych- euthymic mood, full affect Neuro- strength and sensation are intact  Labs    CBC  Recent Labs  09/15/17 1647 09/16/17 0334  WBC 8.5 5.6  NEUTROABS 5.8  --   HGB 15.4 13.7  HCT 44.2 40.5  MCV 90.0 90.0  PLT 295 478   Basic Metabolic Panel  Recent Labs  09/15/17 1638 09/15/17 1722 09/16/17 0334  NA 134*  --  133*  K 3.5  --  3.6  CL 98*  --  101  CO2 23  --  22  GLUCOSE 143*  --  93  BUN 27*  --  23*  CREATININE 1.01  --  0.84  CALCIUM 9.2  --  8.4*  MG  --  2.0  --    Liver Function Tests  Recent Labs  09/16/17 0334  AST 22  ALT 22  ALKPHOS 66  BILITOT 0.6  PROT 5.6*  ALBUMIN  3.1*  Cardiac Enzymes  Recent Labs  09/15/17 2143 09/16/17 0334 09/16/17 1019  TROPONINI 0.18* 0.12* 0.11*   Hemoglobin A1C  Recent Labs  09/16/17 0334  HGBA1C 5.8*   Fasting Lipid Panel  Recent Labs  09/16/17 0334  CHOL 172  HDL 64  LDLCALC 93  TRIG 75  CHOLHDL 2.7     Telemetry    AF with controlled ventricular response (personally reviewed)  Radiology    Dg Chest Port 1 View  Result Date: 09/15/2017 CLINICAL DATA:  Chest palpitations.  History of COPD. EXAM: PORTABLE CHEST 1 VIEW COMPARISON:  PA and lateral chest 01/24/2017 PA and 07/29/2015. FINDINGS: The patient is rotated on the examination. The lungs appear emphysematous but are clear. Heart size is normal. Loop recorder is noted. The patient is status post fixation of left clavicle fracture. IMPRESSION: No acute disease. Electronically Signed   By: Inge Rise M.D.   On: 09/15/2017 17:03     Patient Profile     Brian Hoover is a 83 y.o. male with known trifascicular block, paroxysmal AF who presented with presyncope and  palpitations. He was found to have WCT on presentation consistent with flutter with aberrancy. Has been placed on IV amio for rhythm and rate control.   Assessment & Plan    1.  WCT Consistent with flutter with aberrancy IV amiodarone has improved rates Convert to po today, will do amiodarone 200mg  twice daily until seen by Dr [name] next week.   2.  Trifascicular block with syncope  CRTP recommended yesterday by Dr Rayann Heman I discussed at length with the patient this morning. He is clear in his decision to decline at this time. His main concern is limited mobility of bilateral upper extremities with right collarbone fracture. We discussed risks of heart block and recurrent syncope with baseline conduction system disease. 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 He is willing to have pacer implanted once collarbone heals.   3.  Paroxysmal atrial arrhythmias He is not willing to consider [place] CHADS2VASC is at least 4 Continue amiodarone as above  4.  Chronic systolic heart failure Euvolemic on exam Continue current therapy Caution with BB until back up brady pacing in place  Dr Rayann Heman to see later today I will arrange close outpatient follow up with Dr [name]   Signed, Chanetta Marshall, NP  09/17/2017, 7:37 AM   I have seen, examined the patient, and reviewed the above assessment and plan.  Changes to above are made where necessary.  On exam, iRRR.  Very complicated situation with rapidly conducting atrial arrhythmias as well as trifascicular block and prior syncope at baseline.  I have advised CRT-P implant at this time to which he is very clear in his decision to decline.  He states that he wishes to discuss further with Dr [name] in the office.  I have also advised anticoagulation for stroke prevention which he also declines. As V rates are more stable, will convert IV amiodarone to oral.  Anticipate discharge in next 1-2 days with close follow-up by Dr [name] in the office.  Co Sign: Thompson Grayer,  MD 09/17/2017 8:46 AM

## 2017-09-16 NOTE — Progress Notes (Signed)
CRITICAL VALUE ALERT  Critical Value:  Troponin 0.18  Date & Time Notied:  10/13 2311  Provider Notified: x. Blount and Dr Iven Finn on floor to consult patient and made aware of positive troponins  Orders Received/Actions taken: no additional orders received

## 2017-09-16 NOTE — Progress Notes (Signed)
PROGRESS NOTE    Brian Hoover  VOZ:[id number] DOB: 1932/10/10 DOA: 09/15/2017 PCP: [name], MD    Brief Narrative:  Brian Hoover  is a 84 y.o. male,   w MGUS, Copd not on home o2, CHF (EF  35-40%), atrial fibrillation, apparently woke up at 4am with palpitations lasting about 2 hours,  Pt then later in the day continued to have symptoms of palpitations and therefore presented to ED. Slight nausea, slight dyspnea. Trace edema.   Pt denies fever, chills, cough, cp, orthopnea, pnd. Significant weight gain.   Pt does note about 1 week ago falling and breaking his right collar bone without syncope/ LOC>  Pt was seen by cardiology Dr. [name], and told might have had brief episode of afb.  Pt is on aspirin and not on anticoagulation cause had gum bleeding and rectal bleeding with xarelto ?  Assessment & Plan:   Principal Problem:   Atrial fibrillation with RVR (HCC) Active Problems:   Elevated troponin   Hyponatremia   Atrial fibrillation (HCC)  1-A fib, RVR; continue with amiodarone Gtt Appreciate cardiology Continue with coreg.  Follow echo  2-Mild elevation of troponin; likely related to A fib RVR Follow ECHO  3-HTN; hold lisinopril, Norvasc to avoid hypotension   Dementia Continue Aricept Continue zyprexa  Mild hyponatremia; monitor.   DVT prophylaxis: lovenox Code Status: full code.  Family Communication: care discussed with wife Disposition Plan: home when stable.   Consultants:   Cardiology    Procedures: ECHO   Antimicrobials: none   Subjective: Complaining of pain shoulder right No chest pain   Objective: Vitals:   09/16/17 0606 09/16/17 0706 09/16/17 0844 09/16/17 1249  BP: (!) 155/96 (!) 126/91 122/87 101/77  Pulse: 69 76 92 89  Resp:   17 18  Temp:   98 F (36.7 C) 97.7 F (36.5 C)  TempSrc:   Axillary Axillary  SpO2: 96% 96% 97% 96%  Weight:      Height:        Intake/Output Summary (Last 24 hours) at 09/16/17 1642 Last data  filed at 09/16/17 0445  Gross per 24 hour  Intake           284.11 ml  Output              250 ml  Net            34.11 ml   Filed Weights   09/15/17 1627 09/15/17 2138 09/16/17 0434  Weight: 78.9 kg (174 lb) 79.2 kg (174 lb 8 oz) 78 kg (172 lb)    Examination:  General exam: Appears calm and comfortable  Respiratory system: Clear to auscultation. Respiratory effort normal. Chest with bruises Cardiovascular system: S1 & S2 heard, RRR. No JVD, murmurs, rubs, gallops or clicks. No pedal edema. Gastrointestinal system: Abdomen is nondistended, soft and nontender. No organomegaly or masses felt. Normal bowel sounds heard. Central nervous system: Alert and oriented. No focal neurological deficits. Extremities: Symmetric 5 x 5 power.   Data Reviewed: I have personally reviewed following labs and imaging studies  CBC:  Recent Labs Lab 09/15/17 1647 09/16/17 0334  WBC 8.5 5.6  NEUTROABS 5.8  --   HGB 15.4 13.7  HCT 44.2 40.5  MCV 90.0 90.0  PLT 295 425   Basic Metabolic Panel:  Recent Labs Lab 09/15/17 1638 09/15/17 1722 09/16/17 0334  NA 134*  --  133*  K 3.5  --  3.6  CL 98*  --  101  CO2 23  --  22  GLUCOSE 143*  --  93  BUN 27*  --  23*  CREATININE 1.01  --  0.84  CALCIUM 9.2  --  8.4*  MG  --  2.0  --    GFR: Estimated Creatinine Clearance: 68.8 mL/min (by C-G formula based on SCr of 0.84 mg/dL). Liver Function Tests:  Recent Labs Lab 09/16/17 0334  AST 22  ALT 22  ALKPHOS 66  BILITOT 0.6  PROT 5.6*  ALBUMIN 3.1*   No results for input(s): LIPASE, AMYLASE in the last 168 hours. No results for input(s): AMMONIA in the last 168 hours. Coagulation Profile: No results for input(s): INR, PROTIME in the last 168 hours. Cardiac Enzymes:  Recent Labs Lab 09/15/17 1647 09/15/17 2143 09/16/17 0334 09/16/17 1019  TROPONINI 0.16* 0.18* 0.12* 0.11*   BNP (last 3 results) No results for input(s): PROBNP in the last 8760 hours. HbA1C:  Recent Labs   09/16/17 0334  HGBA1C 5.8*   CBG: No results for input(s): GLUCAP in the last 168 hours. Lipid Profile:  Recent Labs  09/16/17 0334  CHOL 172  HDL 64  LDLCALC 93  TRIG 75  CHOLHDL 2.7   Thyroid Function Tests: No results for input(s): TSH, T4TOTAL, FREET4, T3FREE, THYROIDAB in the last 72 hours. Anemia Panel: No results for input(s): VITAMINB12, FOLATE, FERRITIN, TIBC, IRON, RETICCTPCT in the last 72 hours. Sepsis Labs: No results for input(s): PROCALCITON, LATICACIDVEN in the last 168 hours.  No results found for this or any previous visit (from the past 240 hour(s)).       Radiology Studies: Dg Chest Port 1 View  Result Date: 09/15/2017 CLINICAL DATA:  Chest palpitations.  History of COPD. EXAM: PORTABLE CHEST 1 VIEW COMPARISON:  PA and lateral chest 01/24/2017 PA and 07/29/2015. FINDINGS: The patient is rotated on the examination. The lungs appear emphysematous but are clear. Heart size is normal. Loop recorder is noted. The patient is status post fixation of left clavicle fracture. IMPRESSION: No acute disease. Electronically Signed   By: Inge Rise M.D.   On: 09/15/2017 17:03        Scheduled Meds: . amLODipine  2.5 mg Oral Daily  . aspirin EC  81 mg Oral Daily  . atorvastatin  80 mg Oral q1800  . carvedilol  3.125 mg Oral BID WC  . cholecalciferol  2,000 Units Oral Daily  . donepezil  10 mg Oral QHS  . enoxaparin (LOVENOX) injection  40 mg Subcutaneous Q24H  . lisinopril  40 mg Oral Daily  . multivitamin with minerals  1 tablet Oral Daily  . nortriptyline  75 mg Oral QHS  . sodium chloride flush  3 mL Intravenous Q12H  . valACYclovir  500 mg Oral Daily   Continuous Infusions: . sodium chloride    . amiodarone 30 mg/hr (09/16/17 1619)     LOS: 1 day    Time spent: 35 minutes.     Elmarie Shiley, MD [facility] Pager [phone]  If 7PM-7AM, please contact night-coverage [email or web address] Password [facility] 09/16/2017, 4:42 PM

## 2017-09-17 ENCOUNTER — Ambulatory Visit: Payer: Medicare Other | Admitting: Physician Assistant

## 2017-09-17 ENCOUNTER — Telehealth: Payer: Self-pay | Admitting: Internal Medicine

## 2017-09-17 MED ORDER — AMIODARONE HCL 200 MG PO TABS
200.0000 mg | ORAL_TABLET | Freq: Two times a day (BID) | ORAL | Status: DC
  Administered 2017-09-17 – 2017-09-18 (×3): 200 mg via ORAL
  Filled 2017-09-17 (×3): qty 1

## 2017-09-17 NOTE — Telephone Encounter (Signed)
New message     Pt wife states that Dr Rayann Heman seen her husband today when he was there making rounds, they want Dr Rayann Heman to order the blood thinner for him before he is discharged, can patient stay on the lovenox?

## 2017-09-17 NOTE — Telephone Encounter (Signed)
Returned call to Pt wife.  Pt wife was concerned that husband had told Dr. Rayann Heman that he did not want to be on a blood thinner.  Pt wife is adamant he should be discharged home on blood thinner.  Explained to wife that the hospitalist is her main doctor, Dr. Rayann Heman and Dr. [name] are consulting with the hospitalist to help them make decisions.  Notified wife to discuss her concerns regarding bloodthinner with the hospitalist.  Pt wife indicates understanding.

## 2017-09-17 NOTE — Progress Notes (Signed)
   EP note reviewed. No further recs.   Candee Furbish, MD

## 2017-09-17 NOTE — Progress Notes (Signed)
PROGRESS NOTE    [name]  NLZ:[id number] DOB: [birth year] DOA: 09/15/2017 PCP: Brian Caraway, MD    Brief Narrative:  Brian Hoover  is a 82 y.o. male,   w MGUS, Copd not on home o2, CHF (EF  35-40%), atrial fibrillation, apparently woke up at 4am with palpitations lasting about 2 hours,  Pt then later in the day continued to have symptoms of palpitations and therefore presented to ED. Slight nausea, slight dyspnea. Trace edema.   Pt denies fever, chills, cough, cp, orthopnea, pnd. Significant weight gain.   Pt does note about 1 week ago falling and breaking his right collar bone without syncope/ LOC>  Pt was seen by cardiology Dr. [name], and told might have had brief episode of afb.  Pt is on aspirin and not on anticoagulation cause had gum bleeding and rectal bleeding with xarelto ?  Assessment & Plan:   Principal Problem:   Atrial fibrillation with RVR (HCC) Active Problems:   Elevated troponin   Hyponatremia   Atrial fibrillation (HCC)  1-A fib, RVR; trifascicular Block.  Treated with amiodarone Gtt Appreciate cardiology On coreg per cardiology .  ECHO with improved EF 40% Anticoagulation per cardiology.  Cardiology recommending CRT-P implant.   2-Mild elevation of troponin; likely related to A fib RVR ECHO with improved EF 45 %  3-HTN; hold lisinopril, Norvasc to avoid hypotension    Dementia Hold Aricept due to conduction block Continue zyprexa  Mild hyponatremia; monitor.   DVT prophylaxis: lovenox Code Status: full code.  Family Communication: care discussed with wife Disposition Plan: home when stable.   Consultants:   Cardiology    Procedures: ECHO   Antimicrobials: none   Subjective: Denies chest pain, dyspnea. Wife at bedside. She would like to speak with EP, regarding anticoagulation and pacer.  Objective: Vitals:   09/17/17 0535 09/17/17 0700 09/17/17 0947 09/17/17 1100  BP: 135/80  124/63   Pulse: (!) 53  90   Resp:  20    Temp:  (!) 97.5 F (36.4 C) 97.8 F (36.6 C)  98.2 F (36.8 C)  TempSrc: Oral Oral  Oral  SpO2: 97% 97% 97%   Weight: 76.2 kg (167 lb 14.4 oz)     Height:        Intake/Output Summary (Last 24 hours) at 09/17/17 1532 Last data filed at 09/17/17 0230  Gross per 24 hour  Intake            390.5 ml  Output              900 ml  Net           -509.5 ml   Filed Weights   09/15/17 2138 09/16/17 0434 09/17/17 0535  Weight: 79.2 kg (174 lb 8 oz) 78 kg (172 lb) 76.2 kg (167 lb 14.4 oz)    Examination:  General exam: NAD Respiratory system: CTAChest with bruises Cardiovascular system: S  1, S 2 IRR Gastrointestinal system: BS present, soft, nt Central nervous system: non focal.  Extremities: Symmetric 5 x 5 power.   Data Reviewed: I have personally reviewed following labs and imaging studies  CBC:  Recent Labs Lab 09/15/17 1647 09/16/17 0334  WBC 8.5 5.6  NEUTROABS 5.8  --   HGB 15.4 13.7  HCT 44.2 40.5  MCV 90.0 90.0  PLT 295 902   Basic Metabolic Panel:  Recent Labs Lab 09/15/17 1638 09/15/17 1722 09/16/17 0334  NA 134*  --  133*  K 3.5  --  3.6  CL 98*  --  101  CO2 23  --  22  GLUCOSE 143*  --  93  BUN 27*  --  23*  CREATININE 1.01  --  0.84  CALCIUM 9.2  --  8.4*  MG  --  2.0  --    GFR: Estimated Creatinine Clearance: 68.8 mL/min (by C-G formula based on SCr of 0.84 mg/dL). Liver Function Tests:  Recent Labs Lab 09/16/17 0334  AST 22  ALT 22  ALKPHOS 66  BILITOT 0.6  PROT 5.6*  ALBUMIN 3.1*   No results for input(s): LIPASE, AMYLASE in the last 168 hours. No results for input(s): AMMONIA in the last 168 hours. Coagulation Profile: No results for input(s): INR, PROTIME in the last 168 hours. Cardiac Enzymes:  Recent Labs Lab 09/15/17 1647 09/15/17 2143 09/16/17 0334 09/16/17 1019  TROPONINI 0.16* 0.18* 0.12* 0.11*   BNP (last 3 results) No results for input(s): PROBNP in the last 8760 hours. HbA1C:  Recent Labs  09/16/17 0334    HGBA1C 5.8*   CBG: No results for input(s): GLUCAP in the last 168 hours. Lipid Profile:  Recent Labs  09/16/17 0334  CHOL 172  HDL 64  LDLCALC 93  TRIG 75  CHOLHDL 2.7   Thyroid Function Tests: No results for input(s): TSH, T4TOTAL, FREET4, T3FREE, THYROIDAB in the last 72 hours. Anemia Panel: No results for input(s): VITAMINB12, FOLATE, FERRITIN, TIBC, IRON, RETICCTPCT in the last 72 hours. Sepsis Labs: No results for input(s): PROCALCITON, LATICACIDVEN in the last 168 hours.  No results found for this or any previous visit (from the past 240 hour(s)).       Radiology Studies: Dg Chest Port 1 View  Result Date: 09/15/2017 CLINICAL DATA:  Chest palpitations.  History of COPD. EXAM: PORTABLE CHEST 1 VIEW COMPARISON:  PA and lateral chest 01/24/2017 PA and 07/29/2015. FINDINGS: The patient is rotated on the examination. The lungs appear emphysematous but are clear. Heart size is normal. Loop recorder is noted. The patient is status post fixation of left clavicle fracture. IMPRESSION: No acute disease. Electronically Signed   By: Inge Rise M.D.   On: 09/15/2017 17:03        Scheduled Meds: . amiodarone  200 mg Oral BID  . aspirin EC  81 mg Oral Daily  . atorvastatin  80 mg Oral q1800  . carvedilol  3.125 mg Oral BID WC  . cholecalciferol  2,000 Units Oral Daily  . enoxaparin (LOVENOX) injection  40 mg Subcutaneous Q24H  . multivitamin with minerals  1 tablet Oral Daily  . nortriptyline  75 mg Oral QHS  . sodium chloride flush  3 mL Intravenous Q12H  . valACYclovir  500 mg Oral Daily   Continuous Infusions: . sodium chloride       LOS: 2 days    Time spent: 35 minutes.     Elmarie Shiley, MD [facility] Pager [phone]  If 7PM-7AM, please contact night-coverage [email or web address] Password [email or web address] 09/17/2017, 3:32 PM

## 2017-09-18 MED ORDER — ACETAMINOPHEN 325 MG PO TABS: 650.0000 mg | ORAL_TABLET | Freq: Four times a day (QID) | ORAL | 0 refills | Status: DC | PRN

## 2017-09-18 MED ORDER — AMIODARONE HCL 200 MG PO TABS: 200.0000 mg | ORAL_TABLET | Freq: Two times a day (BID) | ORAL | 0 refills | Status: DC

## 2017-09-18 MED ORDER — LISINOPRIL 20 MG PO TABS: 20.0000 mg | ORAL_TABLET | Freq: Every day | ORAL | 0 refills | Status: DC

## 2017-09-18 MED ORDER — CARVEDILOL 3.125 MG PO TABS: 3.1250 mg | ORAL_TABLET | Freq: Two times a day (BID) | ORAL | 0 refills | Status: DC

## 2017-09-18 MED ORDER — ATORVASTATIN CALCIUM 80 MG PO TABS: 80.0000 mg | ORAL_TABLET | Freq: Every day | ORAL | 0 refills | Status: DC

## 2017-09-18 MED ORDER — LISINOPRIL 20 MG PO TABS
20.0000 mg | ORAL_TABLET | Freq: Every day | ORAL | Status: DC
  Administered 2017-09-18: 20 mg via ORAL
  Filled 2017-09-18: qty 1

## 2017-09-18 NOTE — Progress Notes (Signed)
Pt typically ambulates with a walker and performs self care independently. After his fall last week, his wife has been assisting more with ADL and pt has not been using his walker as he was told to use the sling when ambulating. OT called Dr. Veverly Fells and received verbal ok for pt to use RW. Pt currently requires min to moderate assistance for ADL and min guard assist for mobility. Educated pt and wife in compensatory strategies as he has limited use of his R shoulder and in positioning R UE for comfort in bed and chair. Will follow acutely, do not anticipate pt will need further OT.   09/18/17 1000  OT Visit Information  Last OT Received On 09/18/17  Assistance Needed +1  History of Present Illness Pt admitted with afib with RVR and elevated troponin. Recent fall with distal R clavical fx, seen OP by Dr. Veverly Fells. PMH: HTN, MGUS, COPD, CHF, afib, dementia.  Precautions  Precautions Fall  Precaution Comments Per TC with Dr. Veverly Fells, pt may use RW with R UE.  Required Braces or Orthoses Sling  Restrictions  Other Position/Activity Restrictions avoided WB on R UE with bed mobility, advised against shoulder elevation  Home Living  Family/patient expects to be discharged to: Private residence  Living Arrangements Spouse/significant other  Available Help at Discharge Family;Available 24 hours/day  Type of Home House  Home Access Level entry  Home Layout Two level;Able to live on main level with bedroom/bathroom  Animal nutritionist - 2 wheels;Shower seat;BSC;Hand held shower head  Prior Function  Level of Independence Independent with assistive device(s)  Comments walks with RW, drives  Communication  Communication No difficulties  Pain Assessment  Pain Assessment No/denies pain  Cognition  Arousal/Alertness Awake/alert  Behavior During Therapy WFL for tasks assessed/performed  Overall Cognitive Status History of cognitive  impairments - at baseline  General Comments hx of dementia per chart, was in OPPT   Upper Extremity Assessment  Upper Extremity Assessment RUE deficits/detail  RUE Deficits / Details recent distal clavicle fx in fall, full use elbow to hand  RUE Unable to fully assess due to immobilization  RUE Coordination decreased gross motor  Lower Extremity Assessment  Lower Extremity Assessment Defer to PT evaluation  Cervical / Trunk Assessment  Cervical / Trunk Assessment Other exceptions  Cervical / Trunk Exceptions hx of chronic back pain  ADL  Overall ADL's  Needs assistance/impaired  Eating/Feeding Set up;Sitting  Eating/Feeding Details (indicate cue type and reason) has been eating with L hand  Grooming Wash/dry hands;Standing;Supervision/safety  Upper Body Bathing Minimal assistance;Sitting  Lower Body Bathing Sit to/from stand;Moderate assistance  Upper Body Dressing  Minimal assistance;Sitting  Lower Body Dressing Moderate assistance;Sit to/from Environmental education officer guard;Ambulation;RW  Toileting- Clothing Manipulation and Hygiene Maximal assistance;Sit to/from stand  Toileting - Clothing Manipulation Details (indicate cue type and reason) advised pt to use L hand for pericare and wet wipes, recommended elastic waist pants  Functional mobility during ADLs Min guard;Rolling walker  General ADL Comments Educated in compensatory strategies for ADL.  Vision- History  Patient Visual Report No change from baseline  Bed Mobility  Overal bed mobility Needs Assistance  Bed Mobility Rolling;Sidelying to Sit;Sit to Sidelying  Rolling Supervision  Sidelying to sit Supervision  Sit to sidelying Supervision  General bed mobility comments performed without rail and HOB down to simulate home set up, encouraged log roll technique for back and minimize pt's use of R  UE  Transfers  Overall transfer level Needs assistance  Equipment used Rolling walker (2 wheeled)  Transfers Sit to/from Stand   Sit to Stand Min guard  General transfer comment cues for hand placement, no physical assist   Balance  Overall balance assessment Needs assistance  Sitting balance-Leahy Scale Good  Standing balance-Leahy Scale Poor  Standing balance comment pt with hx of falls, walker dependent, able to release walker at sink for grooming  OT - End of Session  Equipment Utilized During Treatment Gait belt;Rolling walker  Activity Tolerance Patient tolerated treatment well  Patient left in bed;with call bell/phone within reach;with family/visitor present  OT Assessment  OT Recommendation/Assessment Patient needs continued OT Services  OT Visit Diagnosis Unsteadiness on feet (R26.81)  OT Problem List Impaired balance (sitting and/or standing);Impaired UE functional use;Decreased range of motion  OT Plan  OT Frequency (ACUTE ONLY) Min 2X/week  OT Treatment/Interventions (ACUTE ONLY) Self-care/ADL training;DME and/or AE instruction;Patient/family education;Therapeutic activities  AM-PAC OT "6 Clicks" Daily Activity Outcome Measure  Help from another person eating meals? 3  Help from another person taking care of personal grooming? 3  Help from another person toileting, which includes using toliet, bedpan, or urinal? 2  Help from another person bathing (including washing, rinsing, drying)? 2  Help from another person to put on and taking off regular upper body clothing? 3  Help from another person to put on and taking off regular lower body clothing? 2  6 Click Score 15  ADL G Code Conversion CK  OT Recommendation  Follow Up Recommendations No OT follow up  OT Equipment None recommended by OT  Individuals Consulted  Consulted and Agree with Results and Recommendations Patient;Family member/caregiver  Family Member Consulted wife  Acute Rehab OT Goals  Patient Stated Goal return home  OT Goal Formulation With patient  Time For Goal Achievement 09/25/17  Potential to Achieve Goals Good  OT Time  Calculation  OT Start Time (ACUTE ONLY) 0951  OT Stop Time (ACUTE ONLY) 1034  OT Time Calculation (min) 43 min  OT General Charges  $OT Visit 1 Visit  OT Evaluation  $OT Eval Moderate Complexity 1 Mod  OT Treatments  $Self Care/Home Management  23-37 mins  Written Expression  Dominant Hand Right  09/18/2017 Nestor Lewandowsky, OTR/L Pager: [phone]

## 2017-09-18 NOTE — Evaluation (Signed)
Physical Therapy Evaluation Patient Details Name: Brian Hoover MRN: [id number] DOB: Dec 04, 1932 Today's Date: 09/18/2017   History of Present Illness  Mr. Pola is a 83 yo man with a remote PVC ablation, PAF on aspirin alone, falls, remote PTX, s/p chest tube after a clavicular fracture and . Followed by Dr. [name]; recently seen in the office for a fall and fracture. This was unexplained but possibly concerning for symptomatic bradycardia. He presented to the ED with 2 separate episodes of palpitations and chest pain since this AM. Associated with sweating. In the ED, heart rates 200, wide complex RBBB morphology (similar to underlying rhythm).    Clinical Impression  Patient evaluated by Physical Therapy with no further acute PT needs identified. All education has been completed and the patient has no further questions. Pt ambulated 150' with RW and supervision which is his baseline at home. Pt has been going to outpt PT for strengthening and posture, recommend that he continue this program.  See below for any follow-up Physical Therapy or equipment needs. PT is signing off. Thank you for this referral.     Follow Up Recommendations Outpatient PT    Equipment Recommendations  None recommended by PT    Recommendations for Other Services       Precautions / Restrictions Precautions Precautions: Fall Precaution Comments: Per TC with Dr. Veverly Fells, pt may use RW with R UE. Required Braces or Orthoses: Sling Restrictions Other Position/Activity Restrictions: avoided WB on R UE with bed mobility, advised against shoulder elevation      Mobility  Bed Mobility Overal bed mobility: Modified Independent Bed Mobility: Rolling;Sidelying to Sit;Sit to Sidelying Rolling: Modified independent (Device/Increase time) Sidelying to sit: Modified independent (Device/Increase time)     Sit to sidelying: Modified independent (Device/Increase time) General bed mobility comments: performed without  rail and HOB down to simulate home set up, encouraged log roll technique for back and minimize pt's use of R UE  Transfers Overall transfer level: Needs assistance Equipment used: Rolling walker (2 wheeled) Transfers: Sit to/from Stand Sit to Stand: Min guard         General transfer comment: cues for hand placement, no physical assist   Ambulation/Gait Ambulation/Gait assistance: Supervision Ambulation Distance (Feet): 150 Feet Assistive device: Rolling walker (2 wheeled) Gait Pattern/deviations: Step-through pattern;Trunk flexed Gait velocity: decreased   General Gait Details: distance limited by LBP but pt able to ambulate baseline distance for him. Has been going to outpt PT to work on posture but still quite flexed  Financial trader Rank[place] (Stroke Patients Only)       Balance Overall balance assessment: Needs assistance Sitting-balance support: No upper extremity supported Sitting balance-Leahy Scale: Good     Standing balance support: Single extremity supported Standing balance-Leahy Scale: Poor Standing balance comment: pt with hx of falls, walker dependent, can let go with single extremity at a time                             Pertinent Vitals/Pain Pain Assessment: No/denies pain    Home Living Family/patient expects to be discharged to:: Private residence Living Arrangements: Spouse/significant other Available Help at Discharge: Family;Available 24 hours/day Type of Home: House Home Access: Level entry     Home Layout: Two level;Able to live on main level with bedroom/bathroom Home Equipment: Gilford Rile - 2 wheels;Shower seat;Bedside commode;Hand held shower  head      Prior Function Level of Independence: Independent with assistive device(s)         Comments: walks with RW, drives     Hand Dominance   Dominant Hand: Right    Extremity/Trunk Assessment   Upper Extremity Assessment RUE  Deficits / Details: recent distal clavicle fx in fall, full use elbow to hand RUE Coordination: decreased gross motor    Lower Extremity Assessment Lower Extremity Assessment: Generalized weakness (baseline due to back)    Cervical / Trunk Assessment Cervical / Trunk Assessment: Other exceptions Cervical / Trunk Exceptions: hx of chronic back pain  Communication   Communication: No difficulties  Cognition Arousal/Alertness: Awake/alert Behavior During Therapy: WFL for tasks assessed/performed Overall Cognitive Status: History of cognitive impairments - at baseline                                 General Comments: hx of dementia per chart, was in OPPT       General Comments      Exercises     Assessment/Plan    PT Assessment All further PT needs can be met in the next venue of care  PT Problem List Decreased strength;Decreased activity tolerance;Decreased balance;Decreased mobility       PT Treatment Interventions      PT Goals (Current goals can be found in the Care Plan section)  Acute Rehab PT Goals Patient Stated Goal: return home PT Goal Formulation: All assessment and education complete, DC therapy    Frequency     Barriers to discharge        Co-evaluation               AM-PAC PT "6 Clicks" Daily Activity  Outcome Measure Difficulty turning over in bed (including adjusting bedclothes, sheets and blankets)?: A Little Difficulty moving from lying on back to sitting on the side of the bed? : A Little Difficulty sitting down on and standing up from a chair with arms (e.g., wheelchair, bedside commode, etc,.)?: A Little Help needed moving to and from a bed to chair (including a wheelchair)?: A Little Help needed walking in hospital room?: A Little Help needed climbing 3-5 steps with a railing? : A Little 6 Click Score: 18    End of Session Equipment Utilized During Treatment: Gait belt Activity Tolerance: Patient tolerated treatment  well Patient left: in bed;with call bell/phone within reach;with family/visitor present Nurse Communication: Mobility status PT Visit Diagnosis: Unsteadiness on feet (R26.81);Repeated falls (R29.6)    Time: 2297-9892 PT Time Calculation (min) (ACUTE ONLY): 25 min   Charges:   PT Evaluation $PT Eval Moderate Complexity: 1 Mod PT Treatments $Gait Training: 8-22 mins   PT G Codes:        Leighton Roach, PT  Acute Rehab Services  [place] 09/18/2017, 4:43 PM

## 2017-09-18 NOTE — Progress Notes (Signed)
Electrophysiology Rounding Note  Patient Name: Brian Hoover Date of Encounter: 09/18/2017  Primary Cardiologist: [name] Electrophysiologist: [name]   Subjective   The patient is doing well today.  At this time, the patient denies chest pain, shortness of breath, or any new concerns.  Inpatient Medications    Scheduled Meds: . amiodarone  200 mg Oral BID  . aspirin EC  81 mg Oral Daily  . atorvastatin  80 mg Oral q1800  . carvedilol  3.125 mg Oral BID WC  . cholecalciferol  2,000 Units Oral Daily  . enoxaparin (LOVENOX) injection  40 mg Subcutaneous Q24H  . lisinopril  20 mg Oral Daily  . multivitamin with minerals  1 tablet Oral Daily  . nortriptyline  75 mg Oral QHS  . sodium chloride flush  3 mL Intravenous Q12H  . valACYclovir  500 mg Oral Daily   Continuous Infusions: . sodium chloride     PRN Meds: sodium chloride, acetaminophen **OR** acetaminophen, lidocaine, OLANZapine, polyvinyl alcohol, sodium chloride flush   Vital Signs    Vitals:   09/17/17 1500 09/17/17 2051 09/18/17 0325 09/18/17 0951  BP:  133/80 134/77 (!) 131/92  Pulse:  (!) 103 62 87  Resp:  16 18   Temp: 98.7 F (37.1 C) 98.5 F (36.9 C) 98.7 F (37.1 C)   TempSrc: Oral Oral Oral   SpO2:  93% 97%   Weight:   166 lb 14.4 oz (75.7 kg)   Height:        Intake/Output Summary (Last 24 hours) at 09/18/17 1110 Last data filed at 09/18/17 0327  Gross per 24 hour  Intake              600 ml  Output             1175 ml  Net             -575 ml   Filed Weights   09/16/17 0434 09/17/17 0535 09/18/17 0325  Weight: 172 lb (78 kg) 167 lb 14.4 oz (76.2 kg) 166 lb 14.4 oz (75.7 kg)    Physical Exam    GEN- The patient is elderly appearing, alert and oriented x 3 today.   Head- normocephalic, atraumatic Eyes-  Sclera clear, conjunctiva pink Ears- hearing intact Oropharynx- clear Neck- supple Lungs- Clear to ausculation bilaterally, normal work of breathing Heart- irregular rate and  rhythm  GI- soft, NT, ND, + BS Extremities- no clubbing, cyanosis, or edema, +ecchymosis right chest  Skin- no rash or lesion Psych- euthymic mood, full affect Neuro- strength and sensation are intact  Labs    CBC  Recent Labs  09/15/17 1647 09/16/17 0334  WBC 8.5 5.6  NEUTROABS 5.8  --   HGB 15.4 13.7  HCT 44.2 40.5  MCV 90.0 90.0  PLT 295 175   Basic Metabolic Panel  Recent Labs  09/15/17 1638 09/15/17 1722 09/16/17 0334  NA 134*  --  133*  K 3.5  --  3.6  CL 98*  --  101  CO2 23  --  22  GLUCOSE 143*  --  93  BUN 27*  --  23*  CREATININE 1.01  --  0.84  CALCIUM 9.2  --  8.4*  MG  --  2.0  --    Liver Function Tests  Recent Labs  09/16/17 0334  AST 22  ALT 22  ALKPHOS 66  BILITOT 0.6  PROT 5.6*  ALBUMIN 3.1*   Cardiac Enzymes  Recent Labs  09/15/17 2143 09/16/17 0334 09/16/17 1019  TROPONINI 0.18* 0.12* 0.11*   Hemoglobin A1C  Recent Labs  09/16/17 0334  HGBA1C 5.8*   Fasting Lipid Panel  Recent Labs  09/16/17 0334  CHOL 172  HDL 64  LDLCALC 93  TRIG 75  CHOLHDL 2.7     Telemetry    Rate controlled AF (personally reviewed)  Radiology    No results found.   Patient Profile     Brian Hoover is a 83 y.o. male with known trifascicular block, paroxysmal AF who presented with presyncope and palpitations. He was found to have WCT on presentation consistent with flutter with aberrancy.   Assessment & Plan      1.  WCT Consistent with flutter with aberrancy Continue amiodarone 200mg  twice daily until seen by Dr [name] next week.  2.  Trifascicular block with syncope  CRTP recommended by Dr Rayann Heman He is willing to proceed but would like to wait until after collarbone heals. He is aware of return precautions.   3.  Paroxysmal atrial arrhythmias He is now willing to consider [place] CHADS2VASC is at least 4. He has had prior ICH (found incidentally) and gum bleeding with Xarelto. Will defer to Dr Larwance Sachs in  outpatient setting. Dr Rayann Heman discussed with patient and wife today.  Continue amiodarone as above  4.  Chronic systolic heart failure Euvolemic on exam Continue current therapy  5.  HTN Would restart low dose lisinopril with depressed EF   Ok from our standpoint to discharge to home. He has outpatient follow up arranged with Dr [name] next week.  Electrophysiology team to see as needed while here. Please call with questions.  Signed, Chanetta Marshall, NP  09/18/2017, 11:10 AM   I have seen, examined the patient, and reviewed the above assessment and plan.  Changes to above are made where necessary.  On exam, iRRR.  Pt with rapidly conducting atrial flutter initially, now with rate controlled afib.  I have been very clear that I think that CRT-P prior to discharge is appropriate.  He is clear in his decision to not proceed.  He will followup with Dr [name] in 2 weeks.  No further inpatient EP workup planned.  Co Sign: Thompson Grayer, MD 09/18/2017 5:08 PM

## 2017-09-18 NOTE — Discharge Summary (Addendum)
Physician Discharge Summary  Brian Hoover EVO:[id number] DOB: [birth year] DOA: 09/15/2017  PCP: [name], MD  Admit date: 09/15/2017 Discharge date: 09/18/2017  Admitted From: Home Disposition: Home   Recommendations for Outpatient Follow-up:  1. Follow up with PCP in 1-2 weeks 2. Please obtain BMP/CBC in one week   Home Health: no  Discharge Condition: stable.  CODE STATUS: full code.  Diet recommendation: Heart Healthy  Brief/Interim Summary: Brian Hoover a 83 y.o.male,w MGUS, Copd not on home o2, CHF (EF 35-40%), atrial fibrillation, apparently woke up at 4am with palpitations lasting about 2 hours, Pt then later in the day continued to have symptoms of palpitations and therefore presented to ED. Slight nausea, slight dyspnea. Trace edema. Pt denies fever, chills, cough, cp, orthopnea, pnd. Significant weight gain.   Pt does note about 1 week ago falling and breaking his right collar bone without syncope/ LOC>Pt was seen by cardiology Dr. [name], and told might have had brief episode of afb. Pt is on aspirin and not on anticoagulation cause had gum bleeding and rectal bleeding with xarelto ?  Assessment & Plan:   Principal Problem:   Atrial fibrillation with RVR (HCC) Active Problems:   Elevated troponin   Hyponatremia   Atrial fibrillation (HCC)  1-A fib, RVR; trifascicular Block.  Treated with amiodarone Gtt. Discharge on oral amiodarone and low dose coreg.  Appreciate cardiology On coreg per cardiology .  ECHO with improved EF 40% Anticoagulation per cardiology. Discussion between patient and cardiology regardin anticoagulation, as follow; hold on anticoagulation at this time/  Cardiology recommending CRT-P implant. Patient has decline he wants to get better from clavicle fracture first.   2-Mild elevation of troponin; likely related to A fib RVR ECHO with improved EF 45 %  3-HTN; hold  Norvasc to avoid hypotension  resume lisinopril  lower dose.   Dementia Hold Aricept due to conduction block Continue zyprexa  Mild hyponatremia; monitor.    Discharge Diagnoses:  Principal Problem:   Atrial fibrillation with RVR ([place]) Active Problems:   Elevated troponin   Hyponatremia   Atrial fibrillation [facility])    Discharge Instructions  Discharge Instructions    Diet - low sodium heart healthy    Complete by:  As directed    Increase activity slowly    Complete by:  As directed      Allergies as of 09/18/2017      Reactions   Sulfa Antibiotics Hives   Hives      Medication List    STOP taking these medications   amLODipine 2.5 MG tablet Commonly known as:  NORVASC   cloNIDine 0.1 MG tablet Commonly known as:  CATAPRES   donepezil 10 MG tablet Commonly known as:  ARICEPT   ibuprofen 200 MG tablet Commonly known as:  ADVIL,MOTRIN   ibuprofen 600 MG tablet Commonly known as:  ADVIL,MOTRIN     TAKE these medications   acetaminophen 325 MG tablet Commonly known as:  TYLENOL Take 2 tablets (650 mg total) by mouth every 6 (six) hours as needed for mild pain (or Fever >/= 101).   amiodarone 200 MG tablet Commonly known as:  PACERONE Take 1 tablet (200 mg total) by mouth 2 (two) times daily.   aspirin EC 81 MG tablet Take 81 mg by mouth daily. What changed:  Another medication with the same name was removed. Continue taking this medication, and follow the directions you see here.   carvedilol 3.125 MG tablet Commonly known as:  COREG Take 1  tablet (3.125 mg total) by mouth 2 (two) times daily with a meal.   cholecalciferol 1000 units tablet Commonly known as:  VITAMIN D Take 2,000 Units by mouth daily.   hydroxypropyl methylcellulose / hypromellose 2.5 % ophthalmic solution Commonly known as:  ISOPTO TEARS / GONIOVISC Place 1 drop into both eyes as needed for dry eyes.   Lidocaine 4 % Ptch Apply 1 patch topically daily as needed (pain).   lisinopril 20 MG tablet Commonly known as:   PRINIVIL,ZESTRIL Take 1 tablet (20 mg total) by mouth daily. What changed:  medication strength  how much to take   multivitamin with minerals tablet Take 1 tablet by mouth daily.   nortriptyline 25 MG capsule Commonly known as:  PAMELOR TAKE 3 CAPSULES BY MOUTH AT BEDTIME   OLANZapine 10 MG tablet Commonly known as:  ZYPREXA Take 10 mg by mouth at bedtime as needed (as needed to ward off  cluster headaches.).   valACYclovir 500 MG tablet Commonly known as:  VALTREX Take 500 mg by mouth daily.      Follow-up Information    [name], MD Follow up on 09/26/2017.   Specialty:  Cardiology Why:  at 8:15AM Contact information: [address] [place] [address] [phone]          Allergies  Allergen Reactions  . Sulfa Antibiotics Hives    Hives     Consultations:  Cardiology, EP   Procedures/Studies: Dg Shoulder Right  Result Date: 09/09/2017 CLINICAL DATA:  83 year old male with persistent right shoulder pain since falling this past Friday EXAM: RIGHT SHOULDER - 2+ VIEW COMPARISON:  Remote MRI of the right shoulder 01/07/2007 FINDINGS: Acute mildly displaced fracture through the distal aspect of the right clavicle. There is apex superior angulation of the fracture site. Incompletely imaged surgical repair of AA left clavicle fracture. Trace atherosclerotic calcifications present in the visualized aorta. The lungs are clear. IMPRESSION: 1. Acute mildly displaced fracture through the distal aspect of the right clavicle with apex superior angulation of the fracture site. 2.  Aortic Atherosclerosis (ICD10-170.0) Electronically Signed   By: Jacqulynn Cadet M.D.   On: 09/09/2017 12:12   Dg Chest Port 1 View  Result Date: 09/15/2017 CLINICAL DATA:  Chest palpitations.  History of COPD. EXAM: PORTABLE CHEST 1 VIEW COMPARISON:  PA and lateral chest 01/24/2017 PA and 07/29/2015. FINDINGS: The patient is rotated on the examination. The lungs  appear emphysematous but are clear. Heart size is normal. Loop recorder is noted. The patient is status post fixation of left clavicle fracture. IMPRESSION: No acute disease. Electronically Signed   By: Inge Rise M.D.   On: 09/15/2017 17:03   (Echo, Carotid, EGD, Colonoscopy, ERCP)    Subjective:   Discharge Exam: Vitals:   09/18/17 0951 09/18/17 1113  BP: (!) 131/92 (!) 128/92  Pulse: 87   Resp:    Temp:    SpO2:     Vitals:   09/17/17 2051 09/18/17 0325 09/18/17 0951 09/18/17 1113  BP: 133/80 134/77 (!) 131/92 (!) 128/92  Pulse: (!) 103 62 87   Resp: 16 18    Temp: 98.5 F (36.9 C) 98.7 F (37.1 C)    TempSrc: Oral Oral    SpO2: 93% 97%    Weight:  75.7 kg (166 lb 14.4 oz)    Height:        General: Pt is alert, awake, not in acute distress Cardiovascular: RRR, S1/S2 +, no rubs, no gallops Respiratory:  CTA bilaterally, no wheezing, no rhonchi Abdominal: Soft, NT, ND, bowel sounds + Extremities: no edema, no cyanosis    The results of significant diagnostics from this hospitalization (including imaging, microbiology, ancillary and laboratory) are listed below for reference.     Microbiology: No results found for this or any previous visit (from the past 240 hour(s)).   Labs: BNP (last 3 results)  Recent Labs  09/15/17 1647  BNP 361.4*   Basic Metabolic Panel:  Recent Labs Lab 09/15/17 1638 09/15/17 1722 09/16/17 0334  NA 134*  --  133*  K 3.5  --  3.6  CL 98*  --  101  CO2 23  --  22  GLUCOSE 143*  --  93  BUN 27*  --  23*  CREATININE 1.01  --  0.84  CALCIUM 9.2  --  8.4*  MG  --  2.0  --    Liver Function Tests:  Recent Labs Lab 09/16/17 0334  AST 22  ALT 22  ALKPHOS 66  BILITOT 0.6  PROT 5.6*  ALBUMIN 3.1*   No results for input(s): LIPASE, AMYLASE in the last 168 hours. No results for input(s): AMMONIA in the last 168 hours. CBC:  Recent Labs Lab 09/15/17 1647 09/16/17 0334  WBC 8.5 5.6  NEUTROABS 5.8  --   HGB  15.4 13.7  HCT 44.2 40.5  MCV 90.0 90.0  PLT 295 255   Cardiac Enzymes:  Recent Labs Lab 09/15/17 1647 09/15/17 2143 09/16/17 0334 09/16/17 1019  TROPONINI 0.16* 0.18* 0.12* 0.11*   BNP: Invalid input(s): POCBNP CBG: No results for input(s): GLUCAP in the last 168 hours. D-Dimer No results for input(s): DDIMER in the last 72 hours. Hgb A1c  Recent Labs  09/16/17 0334  HGBA1C 5.8*   Lipid Profile  Recent Labs  09/16/17 0334  CHOL 172  HDL 64  LDLCALC 93  TRIG 75  CHOLHDL 2.7   Thyroid function studies No results for input(s): TSH, T4TOTAL, T3FREE, THYROIDAB in the last 72 hours.  Invalid input(s): FREET3 Anemia work up No results for input(s): VITAMINB12, FOLATE, FERRITIN, TIBC, IRON, RETICCTPCT in the last 72 hours. Urinalysis    Component Value Date/Time   COLORURINE YELLOW 11/22/2016 1925   APPEARANCEUR CLEAR 11/22/2016 1925   LABSPEC 1.014 11/22/2016 1925   PHURINE 6.5 11/22/2016 1925   GLUCOSEU NEGATIVE 11/22/2016 1925   HGBUR NEGATIVE 11/22/2016 1925   BILIRUBINUR NEGATIVE 11/22/2016 1925   KETONESUR NEGATIVE 11/22/2016 1925   PROTEINUR NEGATIVE 11/22/2016 1925   NITRITE NEGATIVE 11/22/2016 1925   LEUKOCYTESUR NEGATIVE 11/22/2016 1925   Sepsis Labs Invalid input(s): PROCALCITONIN,  WBC,  LACTICIDVEN Microbiology No results found for this or any previous visit (from the past 240 hour(s)).   Time coordinating discharge: Over 30 minutes  SIGNED:   Elmarie Shiley, MD  [facility] 09/18/2017, 3:57 PM Pager   If 7PM-7AM, please contact night-coverage [email or web address] Password [email or web address]

## 2017-09-18 NOTE — Discharge Summary (Signed)
Patient in no distress, Iv removed, VSS. Discharge instructions given to patient and wife no questions at this time.

## 2017-09-20 ENCOUNTER — Ambulatory Visit: Payer: Medicare Other | Admitting: Internal Medicine

## 2017-09-26 ENCOUNTER — Encounter: Payer: Self-pay | Admitting: Internal Medicine

## 2017-09-26 ENCOUNTER — Ambulatory Visit (INDEPENDENT_AMBULATORY_CARE_PROVIDER_SITE_OTHER): Payer: Medicare Other | Admitting: Internal Medicine

## 2017-09-26 VITALS — BP 124/72 | HR 62 | Ht 70.0 in | Wt 175.0 lb

## 2017-09-26 DIAGNOSIS — I480 Paroxysmal atrial fibrillation: Secondary | ICD-10-CM

## 2017-09-26 DIAGNOSIS — R002 Palpitations: Secondary | ICD-10-CM | POA: Diagnosis not present

## 2017-09-26 DIAGNOSIS — R42 Dizziness and giddiness: Secondary | ICD-10-CM

## 2017-09-26 DIAGNOSIS — I493 Ventricular premature depolarization: Secondary | ICD-10-CM | POA: Diagnosis not present

## 2017-09-26 MED ORDER — AMIODARONE HCL 200 MG PO TABS: 200.0000 mg | ORAL_TABLET | Freq: Every day | ORAL | 3 refills | Status: DC

## 2017-09-26 NOTE — Patient Instructions (Addendum)
Medication Instructions:  Your physician has recommended you make the following change in your medication:  1.  Next Wednesday 10/03/2017 decrease your amiodarone to 200 mg (one tablet) by mouth once a day.  Labwork: None ordered.  Testing/Procedures: Please cancel event monitor appointment.  Follow-Up: Your physician wants you to follow-up the last week of November with Dr. [name].  Please cancel January appointment with Dr. [name].     Any Other Special Instructions Will Be Listed Below (If Applicable).     If you need a refill on your cardiac medications before your next appointment, please call your pharmacy.

## 2017-09-26 NOTE — Progress Notes (Signed)
HPI Brian Hoover returns today for followup of multiple problems. He presented to the hospital last week with a wide QRS tachycardia which was thought to be atrial flutter with one-to-one AV conduction. He was treated with amiodarone. He reverted to sinus rhythm. The patient is not a candidate for systemic anticoagulation secondary to his history of intracranial hemorrhage. He also has a history of recurrent falls. In the past he has been bothered by PVCs and underwent catheter ablation. He still had occasional PVCs and on amiodarone his PVCs and sensation of palpitations has resolved. He complains of constipation which has been chronic, but worsened on amiodarone. Allergies  Allergen Reactions  . Sulfa Antibiotics Hives    Hives      Current Outpatient Prescriptions  Medication Sig Dispense Refill  . acetaminophen (TYLENOL) 325 MG tablet Take 2 tablets (650 mg total) by mouth every 6 (six) hours as needed for mild pain (or Fever >/= 101). 10 tablet 0  . amiodarone (PACERONE) 200 MG tablet Take 1 tablet (200 mg total) by mouth 2 (two) times daily. 60 tablet 0  . aspirin EC 81 MG tablet Take 81 mg by mouth daily.    . carvedilol (COREG) 3.125 MG tablet Take 1 tablet (3.125 mg total) by mouth 2 (two) times daily with a meal. 60 tablet 0  . cholecalciferol (VITAMIN D) 1000 UNITS tablet Take 2,000 Units by mouth daily.     . hydroxypropyl methylcellulose / hypromellose (ISOPTO TEARS / GONIOVISC) 2.5 % ophthalmic solution Place 1 drop into both eyes as needed for dry eyes.    . Lidocaine 4 % PTCH Apply 1 patch topically daily as needed (pain).    Marland Kitchen lisinopril (PRINIVIL,ZESTRIL) 20 MG tablet Take 1 tablet (20 mg total) by mouth daily. 30 tablet 0  . Multiple Vitamins-Minerals (MULTIVITAMIN WITH MINERALS) tablet Take 1 tablet by mouth daily.    . nortriptyline (PAMELOR) 25 MG capsule TAKE 3 CAPSULES BY MOUTH AT BEDTIME 270 capsule 1  . OLANZapine (ZYPREXA) 10 MG tablet Take 10 mg by mouth at  bedtime as needed (as needed to ward off  cluster headaches.).     Marland Kitchen valACYclovir (VALTREX) 500 MG tablet Take 500 mg by mouth daily.     No current facility-administered medications for this visit.      Past Medical History:  Diagnosis Date  . Atrial fibrillation ([place])   . Back pain   . Bradycardia   . CHF (congestive heart failure) ([place])   . Chronic systolic heart failure ([place])   . COPD (chronic obstructive pulmonary disease) ([place])   . GERD (gastroesophageal reflux disease)   . Hypertension   . MGUS (monoclonal gammopathy of unknown significance) 01/25/2015  . Migraine   . Pneumothorax 01/28/2013  . PVC (premature ventricular contraction)   . Syncope    s/p Medtronic ILR implant 05/2013 by Dr [name]  . Trifascicular block     ROS:   All systems reviewed and negative except as noted in the HPI.   Past Surgical History:  Procedure Laterality Date  . CHEST TUBE INSERTION Right 01/29/2013  . CLAVICLE SURGERY    . LOOP RECORDER IMPLANT  06/02/2013   Medtronic LinQ implanted for syncope by Dr [name]  . LOOP RECORDER IMPLANT N/A 06/02/2013   Procedure: LOOP RECORDER IMPLANT;  Surgeon: [name], MD;  Location: [facility] CATH LAB;  Service: Cardiovascular;  Laterality: N/A;  . PROSTATE SURGERY    . SHOULDER ARTHROSCOPY    .  TENDON REPAIR     right foot     Family History  Problem Relation Age of Onset  . Breast cancer Mother   . Uterine cancer Mother   . Rheum arthritis Son      Social History   Social History  . Marital status: Married    Spouse name: [name]  . Number of children: 2  . Years of education: MA   Occupational History  . retired Chief Financial Officer    Social History Main Topics  . Smoking status: Former Smoker    Packs/day: 1.50    Years: 10.00    Types: Cigarettes    Quit date: 12/04/1960  . Smokeless tobacco: Never Used  . Alcohol use 6.0 oz/week    10 Shots of liquor per week     Comment: 4 Drinks a week  . Drug use: No  . Sexual activity: Yes     Partners: Female    Birth control/ protection: None   Other Topics Concern  . Not on file   Social History Narrative      Pt lives at home with his spouse.   Caffeine Use: 2 cups daily.     BP 124/72   Pulse 62   Ht 5\' 10"  (1.778 m)   Wt 175 lb (79.4 kg)   BMI 25.11 kg/m   Physical Exam:  Well appearing NAD HEENT: Unremarkable Neck:  No JVD, no thyromegally Lymphatics:  No adenopathy Back:  No CVA tenderness Lungs:  Clear with no wheezes, rales, or rhonchi. HEART:  Regular rate rhythm, no murmurs, no rubs, no clicks Abd:  soft, positive bowel sounds, no organomegally, no rebound, no guarding Ext:  2 plus pulses, no edema, no cyanosis, no clubbing Skin:  No rashes no nodules Neuro:  CN II through XII intact, motor grossly intact  EKG - normal sinus rhythm with marked first-degree AV block and right bundle branch block, and left anterior fascicular block   Assess/Plan: 1. Atrial flutter/fibrillation - he is maintaining sinus rhythm on amiodarone therapy. The patient is likely not a candidate for systemic anticoagulation. 2. Trifascicular block - the patient has a history of syncope and has fairly severe conduction system disease which will only be made worse by beta blocker therapy and amiodarone. We discussed the treatment options. Down the road, pacemaker insertion will almost certainly require. He has no immediate indication for pacemaker presently. I will see him back in approximately one month for follow-up of his conduction system disease. 3. Chronic systolic dysfunction - with all the above and with his need for likely pacemaker in the future, a bi-V pacemaker will be recommended. If he has syncope, he is instructed to go to the emergency room. 4. PVCs - his symptom at a PVCs have resolved with amiodarone. 5. Constipation - his constipation has worsened on amiodarone. I've asked him to reduce his dose of amiodarone in a week. Hopefully this will improve his  symptoms.  Cristopher Peru, M.D.

## 2017-09-27 ENCOUNTER — Telehealth: Payer: Self-pay | Admitting: Internal Medicine

## 2017-09-27 DIAGNOSIS — R609 Edema, unspecified: Secondary | ICD-10-CM

## 2017-09-27 NOTE — Telephone Encounter (Signed)
[facility] is calling because Brian Hoover was seen on yesterday, but today he has swollen legs and feet. Is wanting to know what can they do . Please call

## 2017-09-28 MED ORDER — FUROSEMIDE 20 MG PO TABS: 20.0000 mg | ORAL_TABLET | ORAL | 3 refills | Status: DC | PRN

## 2017-09-28 NOTE — Telephone Encounter (Signed)
Call placed to wife.  Per Dr. [name] start lasix 20 mg one tablet by mouth daily as needed for swelling.  Wife indicates understanding.

## 2017-10-01 ENCOUNTER — Telehealth: Payer: Self-pay | Admitting: Internal Medicine

## 2017-10-01 NOTE — Telephone Encounter (Signed)
New message    Patient wife calling to clarify the dosage on Lasix.  Unsure how much to give and how frequent.  Patient wife also concerned that Coreg is causing swelling. Please call.   Pt c/o medication issue:  1. Name of Medication:  Lasix  2. How are you currently taking this medication (dosage and times per day)? As prescribed  3. Are you having a reaction (difficulty breathing--STAT)?  NO  4. What is your medication issue?  Unsure how frequently to give Lasix   Pt c/o medication issue:  1. Name of Medication: Coreg  2. How are you currently taking this medication (dosage and times per day)? As prescribed  3. Are you having a reaction (difficulty breathing--STAT)? NO  4. What is your medication issue? Both feet swelling and right hand

## 2017-10-01 NOTE — Telephone Encounter (Signed)
Returned call to wife.  Clarified lasix order for her.   Pt wife asking about if carvedilol is causing Pt edema?  Carvedilol is relatively new medication ordered during recent hospitalization.  Will discuss with Dr. [name].   Provided education on Pt diagnosis.  Wife indicates understanding.  Will follow up.

## 2017-10-03 NOTE — Telephone Encounter (Signed)
Per Dr. [name], he does not believe the carvedilol is causing the feet edema, however to ensure it is not the carvedilol we will ask the Pt to hold his carvedilol until Monday and then check with Pt to see if there is any improvement.   Notified wife of instructions.  Wife indicates understanding.  Will call back beginning of next week.

## 2017-10-08 NOTE — Telephone Encounter (Signed)
Call back received.  Per wife, discontinuing the carvedilol did not effect the edema in Pt's legs.  Pt resumed taking on Sunday 10/07/2017.  Notified wife this nurse would call her on Friday to see if Pt edema has improved.  Wife indicates understanding.  Will continue to monitor.

## 2017-10-08 NOTE — Telephone Encounter (Signed)
Left message for Brian Hoover.  Requested call back to determine if Pt edema has improved after stopping carvedilol.  Left this nurse name and # for call back.

## 2017-10-10 ENCOUNTER — Other Ambulatory Visit: Payer: Self-pay | Admitting: Cardiology

## 2017-10-11 ENCOUNTER — Other Ambulatory Visit: Payer: Self-pay | Admitting: Cardiology

## 2017-10-11 MED ORDER — LISINOPRIL 20 MG PO TABS: 20.0000 mg | ORAL_TABLET | Freq: Every day | ORAL | 8 refills | Status: DC

## 2017-10-12 NOTE — Telephone Encounter (Signed)
Call placed to wife to inquire on Pt edema.  Per wife, her trial of using 2 tabs (20 mg) lasix had taken care of the Pt edema.  Only edema left is in pt right hand which he has kept dependent d/t fracture.  Advised wife to have Pt prop up hand.  Will cont to monitor.

## 2017-10-16 ENCOUNTER — Other Ambulatory Visit: Payer: Self-pay | Admitting: Ophthalmology

## 2017-10-16 DIAGNOSIS — H05122 Orbital myositis, left orbit: Secondary | ICD-10-CM

## 2017-10-17 MED ORDER — FUROSEMIDE 20 MG PO TABS: 20.0000 mg | ORAL_TABLET | ORAL | 3 refills | Status: DC | PRN

## 2017-10-17 NOTE — Telephone Encounter (Signed)
Call placed to Pt.  Left detailed message per DPR.  Notified that Dr. [name] states ok for Pt to take 40 mg furosemide as needed for edema.  Pt with f/u appt 10/29/2017 @ 4:15 pm with Dr. [name].

## 2017-10-18 ENCOUNTER — Ambulatory Visit
Admission: RE | Admit: 2017-10-18 | Discharge: 2017-10-18 | Disposition: A | Discharge status: Home / Self Care | Payer: Medicare Other | Source: Ambulatory Visit | Attending: Ophthalmology | Admitting: Ophthalmology

## 2017-10-18 ENCOUNTER — Other Ambulatory Visit: Payer: Self-pay | Admitting: Cardiology

## 2017-10-18 DIAGNOSIS — H05122 Orbital myositis, left orbit: Secondary | ICD-10-CM

## 2017-10-18 MED ORDER — IOPAMIDOL (ISOVUE-300) INJECTION 61%
75.0000 mL | Freq: Once | INTRAVENOUS | Status: AC | PRN
  Administered 2017-10-18: 75 mL via INTRAVENOUS

## 2017-10-29 ENCOUNTER — Ambulatory Visit: Payer: Medicare Other | Admitting: Internal Medicine

## 2017-10-29 ENCOUNTER — Encounter: Payer: Self-pay | Admitting: Internal Medicine

## 2017-10-29 VITALS — BP 142/80 | HR 54 | Ht 70.0 in | Wt 171.6 lb

## 2017-10-29 DIAGNOSIS — I453 Trifascicular block: Secondary | ICD-10-CM

## 2017-10-29 DIAGNOSIS — I480 Paroxysmal atrial fibrillation: Secondary | ICD-10-CM | POA: Diagnosis not present

## 2017-10-29 DIAGNOSIS — I5022 Chronic systolic (congestive) heart failure: Secondary | ICD-10-CM

## 2017-10-29 DIAGNOSIS — I493 Ventricular premature depolarization: Secondary | ICD-10-CM | POA: Diagnosis not present

## 2017-10-29 MED ORDER — AMIODARONE HCL 200 MG PO TABS: 100.0000 mg | ORAL_TABLET | Freq: Every day | ORAL | 3 refills | Status: DC

## 2017-10-29 NOTE — Progress Notes (Signed)
HPI Mr. Graziani returns today for follow-up of paroxysmal atrial fibrillation, sinus bradycardia, PVCs, and frequent falls. He has a history of cerebrovascular bleeding. I saw him last approximately one month ago. At that time, he was having paroxysmal atrial fibrillation and frequent PVCs. He was also bradycardic. We started the patient on low-dose amiodarone in conjunction with carvedilol. In the interim, his palpitations have resolved. Unfortunately he complains of sensations of dizziness and tingling in his legs and unusual vision. He denies chest pain or shortness of breath. He does complain of fatigue and weakness. He checks his pulse regularly and notes that his regular. Allergies  Allergen Reactions  . Sulfa Antibiotics Hives    Hives      Current Outpatient Medications  Medication Sig Dispense Refill  . acetaminophen (TYLENOL) 325 MG tablet Take 2 tablets (650 mg total) by mouth every 6 (six) hours as needed for mild pain (or Fever >/= 101). 10 tablet 0  . aspirin EC 81 MG tablet Take 81 mg by mouth daily.    . carvedilol (COREG) 3.125 MG tablet TAKE 1 TABLET BY MOUTH TWICE A DAY WITH A MEAL 60 tablet 8  . cholecalciferol (VITAMIN D) 1000 UNITS tablet Take 2,000 Units by mouth daily.     Marland Kitchen FLUZONE HIGH-DOSE 0.5 ML injection Inject as directed daily.  0  . furosemide (LASIX) 20 MG tablet Take 1-2 tablets (20-40 mg total) as needed by mouth. 90 tablet 3  . hydroxypropyl methylcellulose / hypromellose (ISOPTO TEARS / GONIOVISC) 2.5 % ophthalmic solution Place 1 drop into both eyes as needed for dry eyes.    . Lidocaine 4 % PTCH Apply 1 patch topically daily as needed (pain).    Marland Kitchen lisinopril (PRINIVIL,ZESTRIL) 20 MG tablet Take 1 tablet (20 mg total) daily by mouth. 30 tablet 8  . Multiple Vitamins-Minerals (MULTIVITAMIN WITH MINERALS) tablet Take 1 tablet by mouth daily.    . nortriptyline (PAMELOR) 25 MG capsule TAKE 3 CAPSULES BY MOUTH AT BEDTIME 270 capsule 1  . OLANZapine  (ZYPREXA) 10 MG tablet Take 10 mg by mouth at bedtime as needed (as needed to ward off  cluster headaches.).     Marland Kitchen valACYclovir (VALTREX) 500 MG tablet Take 500 mg by mouth daily.    Marland Kitchen amiodarone (PACERONE) 200 MG tablet Take 0.5 tablets (100 mg total) by mouth daily. 45 tablet 3   No current facility-administered medications for this visit.      Past Medical History:  Diagnosis Date  . Atrial fibrillation ([place])   . Back pain   . Bradycardia   . CHF (congestive heart failure) ([place])   . Chronic systolic heart failure ([place])   . COPD (chronic obstructive pulmonary disease) ([place])   . GERD (gastroesophageal reflux disease)   . Hypertension   . MGUS (monoclonal gammopathy of unknown significance) 01/25/2015  . Migraine   . Pneumothorax 01/28/2013  . PVC (premature ventricular contraction)   . Syncope    s/p Medtronic ILR implant 05/2013 by Dr [name]  . Trifascicular block     ROS:   All systems reviewed and negative except as noted in the HPI.   Past Surgical History:  Procedure Laterality Date  . CHEST TUBE INSERTION Right 01/29/2013  . CLAVICLE SURGERY    . LOOP RECORDER IMPLANT  06/02/2013   Medtronic LinQ implanted for syncope by Dr [name]  . LOOP RECORDER IMPLANT N/A 06/02/2013   Procedure: LOOP RECORDER IMPLANT;  Surgeon: [name], MD;  Location: [facility] CATH LAB;  Service: Cardiovascular;  Laterality: N/A;  . PROSTATE SURGERY    . SHOULDER ARTHROSCOPY    . TENDON REPAIR     right foot     Family History  Problem Relation Age of Onset  . Breast cancer Mother   . Uterine cancer Mother   . Rheum arthritis Son      Social History   Socioeconomic History  . Marital status: Married    Spouse name: [name]  . Number of children: 2  . Years of education: MA  . Highest education level: Not on file  Social Needs  . Financial resource strain: Not on file  . Food insecurity - worry: Not on file  . Food insecurity - inability: Not on file  . Transportation needs -  medical: Not on file  . Transportation needs - non-medical: Not on file  Occupational History  . Occupation: retired Chief Financial Officer  Tobacco Use  . Smoking status: Former Smoker    Packs/day: 1.50    Years: 10.00    Pack years: 15.00    Types: Cigarettes    Last attempt to quit: 12/04/1960    Years since quitting: 56.9  . Smokeless tobacco: Never Used  Substance and Sexual Activity  . Alcohol use: Yes    Alcohol/week: 6.0 oz    Types: 10 Shots of liquor per week    Comment: 4 Drinks a week  . Drug use: No  . Sexual activity: Yes    Partners: Female    Birth control/protection: None  Other Topics Concern  . Not on file  Social History Narrative      Pt lives at home with his spouse.   Caffeine Use: 2 cups daily.     BP (!) 142/80   Pulse (!) 54   Ht 5\' 10"  (1.778 m)   Wt 171 lb 9.6 oz (77.8 kg)   SpO2 96%   BMI 24.62 kg/m   Physical Exam:  Well appearing elderly man, NAD HEENT: Unremarkable Neck:  6 cm JVD, no thyromegally Lymphatics:  No adenopathy Back:  No CVA tenderness Lungs:  Clear, with no wheezes, rales, or rhonchi. HEART:  Regular rate rhythm, no murmurs, no rubs, no clicks Abd:  soft, positive bowel sounds, no organomegally, no rebound, no guarding Ext:  2 plus pulses, no edema, no cyanosis, no clubbing Skin:  No rashes no nodules Neuro:  CN II through XII intact, motor grossly intact  EKG - sinus rhythm with right bundle branch block, left anterior fascicular block, and marked first degree AV block  Assess/Plan: 1. Paroxysmal atrial fibrillation - the patient is maintaining sinus rhythm. Because of his atypical symptoms, my plan is to reduce his dose to 100 mg daily of amiodarone. 2. Chronic systolic heart failure - his symptoms are class II. He will continue his current medical therapy. 3. Weakness and tingling in the legs - the etiology is unclear but the patient is concerned that it is caused by amiodarone. We discussed discontinuation of amiodarone  versus reduction of the dose and the patient prefers reduction in the dose. He will take 100 mg daily and I'll see him back in 2 months. 4. Trifascicular block - he has both sinus bradycardia and fairly advanced conduction system disease. He does not have a clear-cut indication for permanent pacemaker insertion otherwise expect him develop worsening heart block in the near future and when he does, permanent pacemaker and insertion would be indicated.  Cristopher Peru, M.D.

## 2017-10-29 NOTE — Patient Instructions (Addendum)
Medication Instructions:  Your physician has recommended you make the following change in your medication:  1.  Reduce your amiodarone to 1/2 tablet (100 mg) daily by mouth.  Labwork: None ordered.  Testing/Procedures: None ordered.  Follow-Up: Your physician wants you to follow-up in mid January with Dr. [name].    Any Other Special Instructions Will Be Listed Below (If Applicable).  If you need a refill on your cardiac medications before your next appointment, please call your pharmacy.

## 2017-11-01 ENCOUNTER — Telehealth: Payer: Self-pay

## 2017-11-01 MED ORDER — AMIODARONE HCL 200 MG PO TABS: 100.0000 mg | ORAL_TABLET | Freq: Every day | ORAL | 3 refills | Status: DC

## 2017-11-01 NOTE — Telephone Encounter (Signed)
Call received from wife.  Per wife amiodarone was not sent to pharmacy to be filled.  Appears this nurse "printed" script rather than sending to pharmacy.  Resent prescription.  Called Pt to notify script should be available today.  No further action needed.

## 2017-11-29 ENCOUNTER — Telehealth: Payer: Self-pay

## 2017-11-29 NOTE — Telephone Encounter (Signed)
   [facility] Pre-operative Risk Assessment    Request for surgical clearance:  1. What type of surgery is being performed? Spinal cord stimulator trial  2. When is this surgery scheduled? TBD  3. Are there any medications that need to be held prior to surgery and how long? Patient has had recent cardiac issues. please fax over notes from 08/2017. Please advise as to any special recommendations for patient.   4. Practice name and name of physician performing surgery? [facility]. Dr. Maryjean Ka.  5. What is your office phone and fax number? Phone: [phone]. Fax: [phone].   6. Anesthesia type (None, local, MAC, general) ? Not specified.   Stephannie Peters 11/29/2017, 5:18 PM  _________________________________________________________________   (provider comments below)

## 2017-11-30 NOTE — Telephone Encounter (Signed)
   Dr. [name] are you ok with pt being cleared for surgery given conduction disease?  Please respond back to preop pool.

## 2017-11-30 NOTE — Telephone Encounter (Signed)
No current anticoagulation or antiplatelets therapy noted per chart review.    Michelle Vanhise Rodriguez-Guzman PharmD, BCPS, [place] [address] [place] [address] 11/30/2017 7:22 AM

## 2017-12-05 NOTE — Telephone Encounter (Signed)
Yes he may proceed with surgery.

## 2017-12-06 ENCOUNTER — Telehealth: Payer: Self-pay | Admitting: Nurse Practitioner

## 2017-12-06 NOTE — Telephone Encounter (Signed)
   Primary Cardiologist: Cristopher Peru, MD  Chart reviewed as part of pre-operative protocol coverage. Case reviewed with Dr. [name].  Given past medical history and time since last visit, based on ACC/AHA guidelines, MASSEY RUHLAND would be at acceptable risk for the planned procedure without further cardiovascular testing. Regarding ASA therapy, we recommend continuation of ASA throughout the perioperative period.  However, if the surgeon feels that cessation of ASA is required in the perioperative period, it may be stopped 5-7 days prior to surgery with a plan to resume it as soon as felt to be feasible from a surgical standpoint in the post-operative period.   Please call with questions.  Murray Hodgkins, NP 12/06/2017, 2:35 PM

## 2017-12-11 NOTE — Telephone Encounter (Signed)
Dr. [name] has cleared pt for surgery. I will route clearance to Dr. Maryjean Ka at [facility] # listed below.   Lyda Jester, PA-C

## 2017-12-12 ENCOUNTER — Encounter: Payer: Self-pay | Admitting: Cardiology

## 2017-12-12 ENCOUNTER — Ambulatory Visit: Payer: Medicare Other | Admitting: Cardiology

## 2017-12-12 VITALS — BP 130/80 | HR 62 | Ht 70.0 in | Wt 176.4 lb

## 2017-12-12 DIAGNOSIS — I453 Trifascicular block: Secondary | ICD-10-CM

## 2017-12-12 DIAGNOSIS — I493 Ventricular premature depolarization: Secondary | ICD-10-CM

## 2017-12-12 DIAGNOSIS — I480 Paroxysmal atrial fibrillation: Secondary | ICD-10-CM | POA: Diagnosis not present

## 2017-12-12 MED ORDER — AMIODARONE HCL 200 MG PO TABS: 100.0000 mg | ORAL_TABLET | ORAL | 3 refills | Status: DC

## 2017-12-12 NOTE — Patient Instructions (Signed)
Medication Instructions:  Please decrease your Amiodarone to 100 mg every other day. Continue all other medications as listed.  Follow-Up: Follow up in 6 months with Dr. [name].  You will receive a letter in the mail 2 months before you are due.  Please call us when you receive this letter to schedule your follow up appointment.  If you need a refill on your cardiac medications before your next appointment, please call your pharmacy.  Thank you for choosing [facility]!!

## 2017-12-12 NOTE — Progress Notes (Signed)
Cardiology Office Note:    Date:  12/12/2017   ID:  [name], DOB 01-10-1932, MRN [id number]  PCP:  [name], MD  Cardiologist:  Cristopher Peru, MD   Referring MD: [name], MD     History of Present Illness:    Brian Hoover is a 84 y.o. male patient of mine and Dr. Forde Dandy with history of paroxysmal atrial fibrillation, sinus bradycardia, PVCs, frequent falls and history of cerebrovascular bleeding.  In review of Dr. Tanna Furry note from 10/29/17 he was started on low-dose amiodarone in conjunction with carvedilol to help his PVCs and his palpitations had resolved.  But he did have sensations of dizziness and tingling and unusual vision.Is  Weakness and fatigue was also noted.  His amiodarone was reduced to 100 mg.  He was concerned that the weakness and tingling in his legs was caused by amiodarone.  This is why the dose was reduced.  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  Pacemaker may be warranted in the future  He is still having some of this tingling-like sensation in bilateral lower extremities.  It is hampering his exercise.  He also has severe back pain and is contemplating an implantable device to help with this.  Please see below for details.  Past Medical History:  Diagnosis Date  . Atrial fibrillation ([place])   . Back pain   . Bradycardia   . CHF (congestive heart failure) ([place])   . Chronic systolic heart failure ([place])   . COPD (chronic obstructive pulmonary disease) ([place])   . GERD (gastroesophageal reflux disease)   . Hypertension   . MGUS (monoclonal gammopathy of unknown significance) 01/25/2015  . Migraine   . Pneumothorax 01/28/2013  . PVC (premature ventricular contraction)   . Syncope    s/p Medtronic ILR implant 05/2013 by Dr [name]  . Trifascicular block     Past Surgical History:  Procedure Laterality Date  . CHEST TUBE INSERTION Right 01/29/2013  . CLAVICLE SURGERY    . LOOP RECORDER IMPLANT  06/02/2013     Medtronic LinQ implanted for syncope by Dr [name]  . LOOP RECORDER IMPLANT N/A 06/02/2013   Procedure: LOOP RECORDER IMPLANT;  Surgeon: [name], MD;  Location: [facility] CATH LAB;  Service: Cardiovascular;  Laterality: N/A;  . PROSTATE SURGERY    . SHOULDER ARTHROSCOPY    . TENDON REPAIR     right foot    Current Medications: Current Meds  Medication Sig  . acetaminophen (TYLENOL) 325 MG tablet Take 2 tablets (650 mg total) by mouth every 6 (six) hours as needed for mild pain (or Fever >/= 101).  Marland Kitchen amiodarone (PACERONE) 200 MG tablet Take 0.5 tablets (100 mg total) by mouth every other day.  Marland Kitchen aspirin EC 81 MG tablet Take 81 mg by mouth daily.  . carvedilol (COREG) 3.125 MG tablet TAKE 1 TABLET BY MOUTH TWICE A DAY WITH A MEAL  . cholecalciferol (VITAMIN D) 1000 UNITS tablet Take 2,000 Units by mouth daily.   Marland Kitchen FLUZONE HIGH-DOSE 0.5 ML injection Inject as directed daily.  . furosemide (LASIX) 20 MG tablet Take 1-2 tablets (20-40 mg total) as needed by mouth.  . hydroxypropyl methylcellulose / hypromellose (ISOPTO TEARS / GONIOVISC) 2.5 % ophthalmic solution Place 1 drop into both eyes as needed for dry eyes.  . Lidocaine 4 % PTCH Apply 1 patch topically daily as needed (pain).  Marland Kitchen lisinopril (PRINIVIL,ZESTRIL) 20 MG tablet Take 1 tablet (20 mg total)  daily by mouth.  . Multiple Vitamins-Minerals (MULTIVITAMIN WITH MINERALS) tablet Take 1 tablet by mouth daily.  . nortriptyline (PAMELOR) 25 MG capsule TAKE 3 CAPSULES BY MOUTH AT BEDTIME  . OLANZapine (ZYPREXA) 10 MG tablet Take 10 mg by mouth at bedtime as needed (as needed to ward off  cluster headaches.).   Marland Kitchen valACYclovir (VALTREX) 1000 MG tablet Take 1 g by mouth daily.  . valACYclovir (VALTREX) 500 MG tablet Take 500 mg by mouth daily.  . [DISCONTINUED] amiodarone (PACERONE) 200 MG tablet Take 0.5 tablets (100 mg total) by mouth daily.     Allergies:   Sulfa antibiotics   Social History   Socioeconomic History  . Marital  status: Married    Spouse name: [name]  . Number of children: 2  . Years of education: MA  . Highest education level: None  Social Needs  . Financial resource strain: None  . Food insecurity - worry: None  . Food insecurity - inability: None  . Transportation needs - medical: None  . Transportation needs - non-medical: None  Occupational History  . Occupation: retired Chief Financial Officer  Tobacco Use  . Smoking status: Former Smoker    Packs/day: 1.50    Years: 10.00    Pack years: 15.00    Types: Cigarettes    Last attempt to quit: 12/04/1960    Years since quitting: 57.0  . Smokeless tobacco: Never Used  Substance and Sexual Activity  . Alcohol use: Yes    Alcohol/week: 6.0 oz    Types: 10 Shots of liquor per week    Comment: 4 Drinks a week  . Drug use: No  . Sexual activity: Yes    Partners: Female    Birth control/protection: None  Other Topics Concern  . None  Social History Narrative      Pt lives at home with his spouse.   Caffeine Use: 2 cups daily.     Family History: The patient's family history includes Breast cancer in his mother; Rheum arthritis in his son; Uterine cancer in his mother.  ROS:   Please see the history of present illness.     All other systems reviewed and are negative.  EKGs/Labs/Other Studies Reviewed:    The following studies were reviewed today: ECHO: 09/16/17 - Left ventricle: The cavity size was normal. Wall thickness was   increased in a pattern of moderate LVH. Systolic function was   mildly to moderately reduced. The estimated ejection fraction was   in the range of 40% to 45%. Inferior akinesis. The study is not   technically sufficient to allow evaluation of LV diastolic   function. - Aortic valve: Trileaflet. Sclerosis without stenosis. There was   no regurgitation. - Mitral valve: Calcified annulus. Mildly thickened leaflets .   There was trivial regurgitation. - Left atrium: The atrium was normal in size. - Tricuspid valve:  There was trivial regurgitation. - Pulmonary arteries: PA peak pressure: 19 mm Hg (S). - Systemic veins: The IVC was not visualized.  Impressions:  - Compared to a prior echo in 06/2017, the LVEF is improved to   40-45% (from 35-40%) with inferior akinesis.  EKG:  None today  Recent Labs: 05/23/2017: TSH 4.230 09/15/2017: B Natriuretic Peptide 427.4; Magnesium 2.0 09/16/2017: ALT 22; BUN 23; Creatinine, Ser 0.84; Hemoglobin 13.7; Platelets 255; Potassium 3.6; Sodium 133  Recent Lipid Panel    Component Value Date/Time   CHOL 172 09/16/2017 0334   TRIG 75 09/16/2017 0334   HDL 64 09/16/2017  0334   CHOLHDL 2.7 09/16/2017 0334   VLDL 15 09/16/2017 0334   LDLCALC 93 09/16/2017 0334    Physical Exam:    VS:  BP 130/80   Pulse 62   Ht 5\' 10"  (1.778 m)   Wt 176 lb 6.4 oz (80 kg)   SpO2 94%   BMI 25.31 kg/m     Wt Readings from Last 3 Encounters:  12/12/17 176 lb 6.4 oz (80 kg)  10/29/17 171 lb 9.6 oz (77.8 kg)  09/26/17 175 lb (79.4 kg)     GEN:  Well nourished, well developed in no acute distress HEENT: Normal NECK: No JVD; No carotid bruits LYMPHATICS: No lymphadenopathy CARDIAC: RRR, no murmurs, rubs, gallops RESPIRATORY:  Clear to auscultation without rales, wheezing or rhonchi  ABDOMEN: Soft, non-tender, non-distended MUSCULOSKELETAL:  No edema; No deformity  SKIN: Warm and dry NEUROLOGIC:  Alert and oriented x 3 PSYCHIATRIC:  Normal affect   ASSESSMENT:    1. Paroxysmal atrial fibrillation (HCC)   2. PVC's (premature ventricular contractions)   3. Trifascicular block    PLAN:    In order of problems listed above:  Paroxysmal atrial fibrillation -The amiodarone 100 mg a day has kept him in normal rhythm.  No PVCs.  No palpitations.  Unfortunately, he is still feeling some tingling-like sensation in his bilateral lower extremities.  This is hindering some of his walking activity.  Dr. [name] reduced his dosing from 200 mg down to 100 mg a day previously  in October.  He is interested in going even lower.  I said it is unusual to use 50 mg daily but he would like to try 100 mg every other day.  He understands the risks versus benefits of this medication.  Certainly neuropathies can be a side effect of amiodarone.  Nonischemic cardiomyopathy -EF 40-45%.  Now the PVCs have improved hopefully this has improved as well.  Preoperative cardiac risk assessment -It is reasonable for him to proceed with surgery with moderate overall cardiac risk if he would like to get an implantable device for his back pain.  Dr. Maryjean Ka.  Please see note from 12/06/17. + We will see him back in 6 months  Medication Adjustments/Labs and Tests Ordered: Current medicines are reviewed at length with the patient today.  Concerns regarding medicines are outlined above.  No orders of the defined types were placed in this encounter.  Meds ordered this encounter  Medications  . amiodarone (PACERONE) 200 MG tablet    Sig: Take 0.5 tablets (100 mg total) by mouth every other day.    Dispense:  45 tablet    Refill:  3    Signed, Candee Furbish, MD  12/12/2017 4:37 PM    [place]

## 2017-12-13 ENCOUNTER — Ambulatory Visit: Payer: Medicare Other | Admitting: Internal Medicine

## 2017-12-14 ENCOUNTER — Encounter: Payer: Self-pay | Admitting: *Deleted

## 2017-12-20 ENCOUNTER — Telehealth: Payer: Self-pay | Admitting: Internal Medicine

## 2017-12-20 NOTE — Telephone Encounter (Signed)
Office notes faxed 

## 2017-12-20 NOTE — Telephone Encounter (Signed)
New message  Brian Hoover verbalized that she is calling for RN  From dr.[facility] office and she needs the last two office notes that   Support the pt cardiac clearance   Fax [phone]

## 2017-12-20 NOTE — Telephone Encounter (Signed)
New message      [facility] Pre-operative Risk Assessment    Request for surgical clearance:  1. What type of surgery is being performed? Spinal Cord Stimulator  2. When is this surgery scheduled? Not schedule yet  3. Are there any medications that need to be held prior to surgery and how long? Any blood thinners and time depending on blood thinner  4. Practice name and name of physician performing surgery? Dr. Maryjean Ka at [facility]  5. What is your office phone and fax number? [phone]  6. Anesthesia type (None, local, MAC, general) ? N/A   Nicholes Stairs 12/20/2017, 8:52 AM  _________________________________________________________________   (provider comments below)

## 2017-12-20 NOTE — Telephone Encounter (Signed)
Cleared by Dr [name] on 1/91/19  Kerin Ransom PA-C 12/20/2017 2:25 PM

## 2017-12-24 NOTE — Telephone Encounter (Signed)
F/U Call:  Raquel Sarna calling from [facility], states that she needs the actual clearance form faxed to [phone]

## 2017-12-24 NOTE — Telephone Encounter (Signed)
Left message with Raquel Sarna that forms are not kept at [facility] office and clearance has been sent via Epic. Left message if an actual clearance form needed will have to resend to fax [phone]

## 2017-12-28 ENCOUNTER — Telehealth: Payer: Self-pay | Admitting: Internal Medicine

## 2017-12-28 DIAGNOSIS — Z79899 Other long term (current) drug therapy: Secondary | ICD-10-CM

## 2017-12-28 DIAGNOSIS — I480 Paroxysmal atrial fibrillation: Secondary | ICD-10-CM

## 2017-12-28 DIAGNOSIS — R5383 Other fatigue: Secondary | ICD-10-CM

## 2017-12-28 DIAGNOSIS — R0602 Shortness of breath: Secondary | ICD-10-CM

## 2017-12-28 DIAGNOSIS — I10 Essential (primary) hypertension: Secondary | ICD-10-CM

## 2017-12-28 NOTE — Telephone Encounter (Signed)
We discussed how he is feeling at last visit. Decreased amiodarone then to unconventional 100mg  every other day. On very low dose coreg 3.125 BID  Stop amiodarone. They understand that arrhythmia may occur. Decrease carvedilol to 3.125 mg once a day.  Order CMET, CBC, TSH, Free T4  Candee Furbish, MD

## 2017-12-28 NOTE — Telephone Encounter (Signed)
New Message   Patients wife [name] is calling on behalf of patient. She states that she is noticing that he is feeling out of sorts. He is fatigue and nauseated. She is not sure if its his medication or something else. Please call to discuss.

## 2017-12-28 NOTE — Telephone Encounter (Signed)
Left message Dr [name] had reviewed this information and gave orders to d/c amiodarone, decrease carvedilol to once daily and have blood work.  Advised I will c/b on Monday to f/u and discuss further.

## 2017-12-28 NOTE — Telephone Encounter (Signed)
Spoke with wife (on [place]) who reports pt has been feeling "really cruddy for days and days."  2 days ago they decided to hold his Furosemide to see if that would help and it has not.  She reports he has no appetite, fatigued and just doesn't feel good.  BP has been 122/75, 116/80, 145/72 and HR has been between 56-62 bpm.  She believes this is all related to his medications and is asking if something needs to be changed.  She states he should be outside taking a walk or something and all he is doing is sitting on the couch.  I asked her if she thinks he could be depressed and she is adamant that he is not and doesn't need to be seen by his PCP for evaluation.  She is asking if pt can take his carvedilol once daily instead of BID and I advised her he can not.  Advised I will forward this information to both Dr [name] and Dr [name] for review and someone will c/b once that has occurred.  Pt does have an appt with Dr [name] 01/02/18 however wife states she doesn't want to wait until then for something to be done about this.

## 2017-12-30 ENCOUNTER — Other Ambulatory Visit: Payer: Self-pay | Admitting: Neurology

## 2017-12-31 MED ORDER — CARVEDILOL 3.125 MG PO TABS: 3.1250 mg | ORAL_TABLET | Freq: Every day | ORAL | 6 refills | Status: DC

## 2017-12-31 NOTE — Telephone Encounter (Signed)
Spoke with patient who is aware of medication changes and need for blood work.  He will have blood drawn on Wednesday while here for his appt with Dr [name].

## 2018-01-01 NOTE — Progress Notes (Signed)
Brian Hoover w/[facility] pharmacy called to request 90 supply or nortriptyline. Advsd him Pt missed an appt in Oct, will need appt before can refill

## 2018-01-02 ENCOUNTER — Other Ambulatory Visit: Payer: Medicare Other | Admitting: *Deleted

## 2018-01-02 ENCOUNTER — Ambulatory Visit: Payer: Medicare Other | Admitting: Internal Medicine

## 2018-01-02 ENCOUNTER — Encounter: Payer: Self-pay | Admitting: Internal Medicine

## 2018-01-02 VITALS — BP 122/82 | HR 52 | Ht 70.0 in | Wt 175.0 lb

## 2018-01-02 DIAGNOSIS — I480 Paroxysmal atrial fibrillation: Secondary | ICD-10-CM

## 2018-01-02 DIAGNOSIS — I5022 Chronic systolic (congestive) heart failure: Secondary | ICD-10-CM

## 2018-01-02 DIAGNOSIS — I493 Ventricular premature depolarization: Secondary | ICD-10-CM

## 2018-01-02 DIAGNOSIS — I453 Trifascicular block: Secondary | ICD-10-CM | POA: Diagnosis not present

## 2018-01-02 DIAGNOSIS — I10 Essential (primary) hypertension: Secondary | ICD-10-CM

## 2018-01-02 DIAGNOSIS — R5383 Other fatigue: Secondary | ICD-10-CM

## 2018-01-02 DIAGNOSIS — Z79899 Other long term (current) drug therapy: Secondary | ICD-10-CM

## 2018-01-02 DIAGNOSIS — R0602 Shortness of breath: Secondary | ICD-10-CM

## 2018-01-02 NOTE — Patient Instructions (Signed)
Medication Instructions:  Your physician recommends that you continue on your current medications as directed. Please refer to the Current Medication list given to you today.  Labwork: You will get lab work today ordered by Dr. [name].  Testing/Procedures: None ordered.  Follow-Up: Your physician wants you to follow-up in: 3 months with Dr. [name].     Any Other Special Instructions Will Be Listed Below (If Applicable).   If you need a refill on your cardiac medications before your next appointment, please call your pharmacy.

## 2018-01-02 NOTE — Progress Notes (Signed)
HPI Mr. Dunnaway returns today for ongoing evaluation and management of atrial fib, sinus node dysfunction and PVC's. He remotely underwent PVC ablation and his PVC cardiomyopathy resolved. He has had problems over the years with falls and possible syncope and had an ILR which was unremarkable. He has developed PAF and was treated with amiodarone but developed disabling side effects and ultimately he stopped the amio 4 days ago despite being on only 100 mg daily. He states that he immediately felt better. He has not yet had recurrent PVC's or PAF. His bradycardia is stable. He has had worsening sinus node dysfunction on amio and his low dose coreg. He has decreased the dose of his coreg to 3.125 mg daily. He denies syncope. Allergies  Allergen Reactions  . Sulfa Antibiotics Hives    Hives      Current Outpatient Medications  Medication Sig Dispense Refill  . acetaminophen (TYLENOL) 325 MG tablet Take 2 tablets (650 mg total) by mouth every 6 (six) hours as needed for mild pain (or Fever >/= 101). 10 tablet 0  . aspirin EC 81 MG tablet Take 81 mg by mouth daily.    . carvedilol (COREG) 3.125 MG tablet Take 1 tablet (3.125 mg total) by mouth daily. 30 tablet 6  . cholecalciferol (VITAMIN D) 1000 UNITS tablet Take 2,000 Units by mouth daily.     Marland Kitchen FLUZONE HIGH-DOSE 0.5 ML injection Inject as directed daily.  0  . hydroxypropyl methylcellulose / hypromellose (ISOPTO TEARS / GONIOVISC) 2.5 % ophthalmic solution Place 1 drop into both eyes as needed for dry eyes.    . Lidocaine 4 % PTCH Apply 1 patch topically daily as needed (pain).    Marland Kitchen lisinopril (PRINIVIL,ZESTRIL) 20 MG tablet Take 1 tablet (20 mg total) daily by mouth. 30 tablet 8  . Multiple Vitamins-Minerals (MULTIVITAMIN WITH MINERALS) tablet Take 1 tablet by mouth daily.    . nortriptyline (PAMELOR) 25 MG capsule TAKE 3 CAPSULES BY MOUTH AT BEDTIME 270 capsule 1  . OLANZapine (ZYPREXA) 10 MG tablet Take 10 mg by mouth at bedtime as  needed (as needed to ward off  cluster headaches.).     Marland Kitchen valACYclovir (VALTREX) 1000 MG tablet Take 1 g by mouth daily.     No current facility-administered medications for this visit.      Past Medical History:  Diagnosis Date  . Atrial fibrillation ([place])   . Back pain   . Bradycardia   . CHF (congestive heart failure) ([place])   . Chronic systolic heart failure ([place])   . COPD (chronic obstructive pulmonary disease) ([place])   . GERD (gastroesophageal reflux disease)   . Hypertension   . MGUS (monoclonal gammopathy of unknown significance) 01/25/2015  . Migraine   . Pneumothorax 01/28/2013  . PVC (premature ventricular contraction)   . Syncope    s/p Medtronic ILR implant 05/2013 by Dr [name]  . Trifascicular block     ROS:   All systems reviewed and negative except as noted in the HPI.   Past Surgical History:  Procedure Laterality Date  . CHEST TUBE INSERTION Right 01/29/2013  . CLAVICLE SURGERY    . LOOP RECORDER IMPLANT  06/02/2013   Medtronic LinQ implanted for syncope by Dr [name]  . LOOP RECORDER IMPLANT N/A 06/02/2013   Procedure: LOOP RECORDER IMPLANT;  Surgeon: [name], MD;  Location: [facility] CATH LAB;  Service: Cardiovascular;  Laterality: N/A;  . PROSTATE SURGERY    . SHOULDER ARTHROSCOPY    .  TENDON REPAIR     right foot     Family History  Problem Relation Age of Onset  . Breast cancer Mother   . Uterine cancer Mother   . Rheum arthritis Son      Social History   Socioeconomic History  . Marital status: Married    Spouse name: [name]  . Number of children: 2  . Years of education: MA  . Highest education level: Not on file  Social Needs  . Financial resource strain: Not on file  . Food insecurity - worry: Not on file  . Food insecurity - inability: Not on file  . Transportation needs - medical: Not on file  . Transportation needs - non-medical: Not on file  Occupational History  . Occupation: retired Chief Financial Officer  Tobacco Use  . Smoking status:  Former Smoker    Packs/day: 1.50    Years: 10.00    Pack years: 15.00    Types: Cigarettes    Last attempt to quit: 12/04/1960    Years since quitting: 57.1  . Smokeless tobacco: Never Used  Substance and Sexual Activity  . Alcohol use: Yes    Alcohol/week: 6.0 oz    Types: 10 Shots of liquor per week    Comment: 4 Drinks a week  . Drug use: No  . Sexual activity: Yes    Partners: Female    Birth control/protection: None  Other Topics Concern  . Not on file  Social History Narrative      Pt lives at home with his spouse.   Caffeine Use: 2 cups daily.     BP 122/82   Pulse (!) 52   Ht 5\' 10"  (1.778 m)   Wt 175 lb (79.4 kg)   BMI 25.11 kg/m   Physical Exam:  Well appearing 84 yo man, NAD HEENT: Unremarkable Neck:  6 cm JVD, no thyromegally Lymphatics:  No adenopathy Back:  No CVA tenderness Lungs:  Clear with no wheezes HEART:  Regular rate rhythm, no murmurs, no rubs, no clicks Abd:  soft, positive bowel sounds, no organomegally, no rebound, no guarding Ext:  2 plus pulses, no edema, no cyanosis, no clubbing Skin:  No rashes no nodules Neuro:  CN II through XII intact, motor grossly intact  EKG - NSR with RBBB, lAFB, first degree AV block   Assess/Plan: 1. PAF - he will certainly have more atrial fib but he is currently maintaining NSR. 2. Sinus node dysfunction - his HR is ok today. He will likely eventually require a PPM but no indication for today. 3. PVC s - He is asymptomatic. I am not in favor of another ablation when the pvcs return due to his advanced age. 4.syncope - he has had no recent recurrences. He will undergo watchful waiting.   Mikle Bosworth.D.

## 2018-01-03 LAB — COMPREHENSIVE METABOLIC PANEL
ALK PHOS: 97 IU/L (ref 39–117)
ALT: 21 IU/L (ref 0–44)
AST: 24 IU/L (ref 0–40)
Albumin/Globulin Ratio: 1.5 (ref 1.2–2.2)
Albumin: 4 g/dL (ref 3.5–4.7)
BUN/Creatinine Ratio: 17 (ref 10–24)
BUN: 18 mg/dL (ref 8–27)
Bilirubin Total: 0.4 mg/dL (ref 0.0–1.2)
CHLORIDE: 97 mmol/L (ref 96–106)
CO2: 22 mmol/L (ref 20–29)
CREATININE: 1.05 mg/dL (ref 0.76–1.27)
Calcium: 9.5 mg/dL (ref 8.6–10.2)
GFR calc Af Amer: 76 mL/min/{1.73_m2} (ref >59)
GFR calc non Af Amer: 65 mL/min/{1.73_m2} (ref >59)
GLUCOSE: 97 mg/dL (ref 65–99)
Globulin, Total: 2.7 g/dL (ref 1.5–4.5)
Potassium: 4.5 mmol/L (ref 3.5–5.2)
Sodium: 137 mmol/L (ref 134–144)
Total Protein: 6.7 g/dL (ref 6.0–8.5)

## 2018-01-03 LAB — CBC
Hematocrit: 42.3 % (ref 37.5–51.0)
Hemoglobin: 14.6 g/dL (ref 13.0–17.7)
MCH: 32.6 pg (ref 26.6–33.0)
MCHC: 34.5 g/dL (ref 31.5–35.7)
MCV: 94 fL (ref 79–97)
PLATELETS: 379 10*3/uL (ref 150–379)
RBC: 4.48 x10E6/uL (ref 4.14–5.80)
RDW: 14.1 % (ref 12.3–15.4)
WBC: 8.3 10*3/uL (ref 3.4–10.8)

## 2018-01-03 LAB — T4, FREE: FREE T4: 0.91 ng/dL (ref 0.82–1.77)

## 2018-01-03 LAB — TSH: TSH: 10.83 u[IU]/mL — AB (ref 0.450–4.500)

## 2018-01-11 ENCOUNTER — Encounter: Payer: Self-pay | Admitting: Internal Medicine

## 2018-01-16 ENCOUNTER — Ambulatory Visit: Payer: Medicare Other | Admitting: Neurology

## 2018-01-16 ENCOUNTER — Encounter: Payer: Self-pay | Admitting: Neurology

## 2018-01-16 ENCOUNTER — Other Ambulatory Visit: Payer: Medicare Other

## 2018-01-16 VITALS — BP 146/100 | HR 54 | Ht 70.0 in | Wt 174.6 lb

## 2018-01-16 DIAGNOSIS — R202 Paresthesia of skin: Secondary | ICD-10-CM

## 2018-01-16 DIAGNOSIS — I619 Nontraumatic intracerebral hemorrhage, unspecified: Secondary | ICD-10-CM

## 2018-01-16 DIAGNOSIS — I480 Paroxysmal atrial fibrillation: Secondary | ICD-10-CM

## 2018-01-16 NOTE — Progress Notes (Signed)
b12

## 2018-01-16 NOTE — Patient Instructions (Signed)
1.  We will check a B12 level.  If normal, then we can try gabapentin to help with the tingling 2.  I would stop alcohol intake 3.  Follow up in 5 months.

## 2018-01-16 NOTE — Progress Notes (Signed)
NEUROLOGY FOLLOW UP OFFICE NOTE  ARIZONA NORDQUIST [id number]  HISTORY OF PRESENT ILLNESS: Brian Hoover is an 84 year old right-handed man with cluster headaches, COPD, HSV II, CHF, GERD, chronic systolic heart failure, PVCs, and history of pheumothorax follows up for headache and dizziness.      UPDATE: He developed paroxymal atrial fibrillation in October.  He developed palpitations and discomfort.  He was started on amiodarone, but he began experiencing "wired" feeling, described as a tingling sensation involving his entire body.  The only time he doesn't notice it is when he is laying down still.  It was thought to be secondary to amiodarone, so it was tapered off.  He has been off of it completely for about 2 weeks.  TSH from last month was over 10 but T4 was normal.  Plan is to retest in 6 months.  Symptoms persist.  He had been drinking a shot of vodka daily to calm the symptoms.  He feels anxious and uncomfortable.  2D echocardiogram from October revealed mildly reduced EF of 40-45%.  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  HEMORRHAGIC STROKE/CRANIAL FOURTH NERVE PALSY: Update: Stable.  He is on ASA.   History: He developed vertical diplopia in 2017, particularly noticeable when he was driving.  It has definitely improved and now only notices it when he has his head tilted back and is looking down.  He saw ophthalmologist, Dr. Jola Schmidt, who diagnosed a fourth nerve palsy.  Previously on Xarelto, which was discontinued due to dark stool and bleeding gums.   HEADACHE: Update: Headaches resolved.  He discontinued nortriptyline.   History: He has history of cluster headaches for over 20 years as well as other headaches for many years.   His cluster headaches are described as involving the right eye, 10/10 intensity, sharp stabbing pain, lasting one hour.  It would occur between 1:30 and 2 am in the morning.  He  was previously treated with verapamil, which caused constipation.  He had used O2 and nasal sprays.  For many years, he has been taking Zyprexa.  He takes it as needed, only when he feels he has a cluster headache.  His last cluster headache was in 2012.   He also has other type of headache.  It is bi-frontal, non-throbbing ache, about 6-7/10.  It is not associated with other symptoms such as nausea or photophobia.  It can last all day and typically occurs 4 times a month.  Often, he wakes up with it.  Sometimes, it would lead into a cluster, so he has taken Zyprexa recently for it, but it has not helped.  He was advised to start taking the Zyprexa daily.  He takes ASA or ibuprofen sparingly.  They only take the edge off.  Drinking two cups of coffee treats it well.  He drinks coffee daily.     DIZZINESS/PRESYNCOPE: Update: He had increased dizziness after increase in donepezil.  We discontinued it to see if symptoms improved.  He restarted the donepezil at 35m and it has not recurred.  History: He has had episodes of feeling "woozy" for several years.  One time it occurred after a 3 mile walk and another time it occurred when he stood up while walking in his garden.  He has not lost consciousness.  There is no spinning sensation but he says that the "room swims".  MRI of the brain and MRA of head  from 11/05/12 were unremarkable.  MRA of neck showed no stenosis except for some smooth plaque at the origin of the right ICA.  Orthostatic testing was reportedly negative.     GAIT INSTABILITY AND CHRONIC BACK PAIN: Update: He sits at choir.  He believes his balance problems are due to the back pain.  He goes to PT.  Recent MRI of lumbar spine is reportedly unchanged.  There is nothing surgical as per Dr. Vertell Limber.  He receives epidural injections by Dr. Maryjean Ka, which are only modestly effective.  Tramadol and Celebrex are ineffective.     History: He was also evaluated for balance issues.  This has been  ongoing for several years.  He denies numbness in the feet or weakness in the legs.  He tore his right achilles heel at that time and had right foot weakness, which improved.  NCV-EMG from 03/17/13 showed evidence of active and chronic right L5 radiculopathy but no evidence of polyneuropathy.  MRI of lumbar spine from 03/20/13 showed multi-level disc bulging and facet hypertrophy but no spinal stenosis or significant foraminal narrowing or cause for L5 radiculopathy.  Neuropathy labs were performed on 01/13/14, revealing small (0.3) M-spike. ANA, Sed Rate, RF, TSH, Hgb A1c, vitamin D 25-hydroxy, and B12 were unremarkable.   Repeat testing of SPEP/IFE from December 2015 again showed an M-Spike with abnormal IgA lambda, consistent with MGUS.  He was referred to Dr. Alvy Bimler of Heme/Onc.  It was recommended to monitor for now. He continues to have non-radiating back pain with numbness in the legs with prolonged standing, unchanged.  A NCV-EMG performed on 01/14/15 showed chronic right L3-L5 radiculopathy but no evidence of generalized sensorimotor polyneuropathy.  To further evaluate gait instability, an MRI of the cervical spine was performed on 01/27/15, which did not reveal spinal stenosis.   MEMORY PROBLEMS: Update: Stable.  he stopped Aricept.  He doesn't want to discuss this further at this time.   History: He has noticed some memory difficulties for about 2 years.  Specifically, he notes some problems recalling names of casual acquaintances (people he has only met maybe a couple of times).  Sometimes, when he is driving on familiar routes to places he frequents, he needs to take a moment to re-orient himself.  This occurs with places such as restaurants, but not places he goes to often, such as the grocery store.  He denies problems with word-finding or remembering to take medication.  He manages his finances without difficulty.  He does not repeat questions.  He does not exhibit REM sleep behavior disorder.   He has not had any personality or behavioral changes.  He denies hallucinations.  There is no known family history of dementia or cognitive impairment.  He has a Brewing technologist.    PAST MEDICAL HISTORY: Past Medical History:  Diagnosis Date  . Atrial fibrillation ([place])   . Back pain   . Bradycardia   . CHF (congestive heart failure) ([place])   . Chronic systolic heart failure ([place])   . COPD (chronic obstructive pulmonary disease) ([place])   . GERD (gastroesophageal reflux disease)   . Hypertension   . MGUS (monoclonal gammopathy of unknown significance) 01/25/2015  . Migraine   . Pneumothorax 01/28/2013  . PVC (premature ventricular contraction)   . Syncope    s/p Medtronic ILR implant 05/2013 by Dr [name]  . Trifascicular block     MEDICATIONS: Current Outpatient Medications on File Prior to Visit  Medication Sig Dispense Refill  . acetaminophen (TYLENOL)  325 MG tablet Take 2 tablets (650 mg total) by mouth every 6 (six) hours as needed for mild pain (or Fever >/= 101). 10 tablet 0  . aspirin EC 81 MG tablet Take 81 mg by mouth daily.    . carvedilol (COREG) 3.125 MG tablet Take 1 tablet (3.125 mg total) by mouth daily. 30 tablet 6  . cholecalciferol (VITAMIN D) 1000 UNITS tablet Take 2,000 Units by mouth daily.     Marland Kitchen FLUZONE HIGH-DOSE 0.5 ML injection Inject as directed daily.  0  . hydroxypropyl methylcellulose / hypromellose (ISOPTO TEARS / GONIOVISC) 2.5 % ophthalmic solution Place 1 drop into both eyes as needed for dry eyes.    . Lidocaine 4 % PTCH Apply 1 patch topically daily as needed (pain).    Marland Kitchen lisinopril (PRINIVIL,ZESTRIL) 20 MG tablet Take 1 tablet (20 mg total) daily by mouth. 30 tablet 8  . Multiple Vitamins-Minerals (MULTIVITAMIN WITH MINERALS) tablet Take 1 tablet by mouth daily.    . nortriptyline (PAMELOR) 25 MG capsule TAKE 3 CAPSULES BY MOUTH AT BEDTIME (Patient not taking: Reported on 01/16/2018) 270 capsule 1  . OLANZapine (ZYPREXA) 10 MG tablet Take 10 mg by mouth at  bedtime as needed (as needed to ward off  cluster headaches.).     Marland Kitchen valACYclovir (VALTREX) 1000 MG tablet Take 1 g by mouth daily.     No current facility-administered medications on file prior to visit.     ALLERGIES: Allergies  Allergen Reactions  . Sulfa Antibiotics Hives    Hives     FAMILY HISTORY: Family History  Problem Relation Age of Onset  . Breast cancer Mother   . Uterine cancer Mother   . Rheum arthritis Son     SOCIAL HISTORY: Social History   Socioeconomic History  . Marital status: Married    Spouse name: [name]  . Number of children: 2  . Years of education: MA  . Highest education level: Not on file  Social Needs  . Financial resource strain: Not on file  . Food insecurity - worry: Not on file  . Food insecurity - inability: Not on file  . Transportation needs - medical: Not on file  . Transportation needs - non-medical: Not on file  Occupational History  . Occupation: retired Chief Financial Officer  Tobacco Use  . Smoking status: Former Smoker    Packs/day: 1.50    Years: 10.00    Pack years: 15.00    Types: Cigarettes    Last attempt to quit: 12/04/1960    Years since quitting: 57.1  . Smokeless tobacco: Never Used  Substance and Sexual Activity  . Alcohol use: Yes    Alcohol/week: 6.0 oz    Types: 10 Shots of liquor per week    Comment: 4 Drinks a week  . Drug use: No  . Sexual activity: Yes    Partners: Female    Birth control/protection: None  Other Topics Concern  . Not on file  Social History Narrative      Pt lives at home with his spouse.   Caffeine Use: 2 cups daily.    REVIEW OF SYSTEMS: Constitutional: No fevers, chills, or sweats, no generalized fatigue, change in appetite Eyes: No visual changes, double vision, eye pain Ear, nose and throat: No hearing loss, ear pain, nasal congestion, sore throat Cardiovascular: No chest pain, palpitations Respiratory:  No shortness of breath at rest or with exertion, wheezes GastrointestinaI: No  nausea, vomiting, diarrhea, abdominal pain, fecal incontinence Genitourinary:  No  dysuria, urinary retention or frequency Musculoskeletal:  No neck pain, back pain Integumentary: No rash, pruritus, skin lesions Neurological: as above Psychiatric: No depression, insomnia, anxiety Endocrine: No palpitations, fatigue, diaphoresis, mood swings, change in appetite, change in weight, increased thirst Hematologic/Lymphatic:  No purpura, petechiae. Allergic/Immunologic: no itchy/runny eyes, nasal congestion, recent allergic reactions, rashes  PHYSICAL EXAM: Vitals:   01/16/18 0932  BP: (!) 146/100  Pulse: (!) 54  SpO2: 94%   General: No acute distress.  Patient appears well-groomed.   Head:  Normocephalic/atraumatic Eyes:  Fundi examined but not visualized Neck: supple, no paraspinal tenderness, full range of motion Heart:  Regular rate and rhythm Lungs:  Clear to auscultation bilaterally Back: No paraspinal tenderness Neurological Exam: alert and oriented to person, place, and time. Attention span and concentration intact, recent and remote memory intact, fund of knowledge intact.  Speech fluent and not dysarthric, language intact.  CN II-XII intact. Bulk and tone normal, muscle strength 5/5 throughout.  Sensation to vibration reduced in left foot.  Pinprick sensation intact.  Deep tendon reflexes 2+ throughout, toes downgoing.  Finger to nose and heel to shin testing intact.  Gait normal, Romberg negative.  IMPRESSION: 1.  Remote hemorrhagic stroke causing fourth nerve palsy, likely secondary to hypertension, resolved 2.  Generalized paresthesias.  Description not consistent with a primary neuropathy/neuralgia.  More likely secondary to a medication (such as amiodarone) or anxiety. 3.  Paroxysmal atrial fibrillation.  In regards to the history of brainstem hemorrhage alone, I don't think anticoagulation is contraindicated.  It was a small bleed secondary to hypertension.  MRI does not indicate  any chronic microbleeds to suggest cerebral amyloid angiopathy.  As per his CHADS2 VASC score, he has increased risk for a thromboembolic event with a yearly stroke risk of 4%.  If he was to start anticoagulation and he would optimize blood pressure control and discontinue ASA/NSAIDs and alcohol, his HasBled Score would be 2, which is low risk for bleeding.  However, with history of falls and if he continues NSAIDs/ASA and alcohol use, then bleeding risk justifies caution. 4.  Episodic tension type headache, resolved 5.  Hypertension  PLAN: 1.  We will check B12 level.  If normal, then will try treating with gabapentin.   2.  Follow up with PCP for HTN. 3.  Follow up in 5 months.  25 minutes spent face to face with patient, over 50% spent discussing management.  Metta Clines, DO  CC: Dr. Addison Lank  Dr. [name]

## 2018-01-17 LAB — VITAMIN B12: Vitamin B-12: 1114 pg/mL — ABNORMAL HIGH (ref 200–1100)

## 2018-01-17 LAB — TIQ-NTM

## 2018-01-18 ENCOUNTER — Telehealth: Payer: Self-pay

## 2018-01-18 ENCOUNTER — Other Ambulatory Visit: Payer: Self-pay

## 2018-01-18 MED ORDER — GABAPENTIN 100 MG PO CAPS: 100.0000 mg | ORAL_CAPSULE | Freq: Three times a day (TID) | ORAL | 3 refills | Status: DC

## 2018-01-18 NOTE — Telephone Encounter (Signed)
-----   Message from Pieter Partridge, DO sent at 01/17/2018  6:51 AM EST ----- B12 level looks okay.  We can try treating the uncomfortable tingling sensation with gabapentin: Start with 100mg  three times daily.  Increase to 200mg  three times daily in one week Then to 300mg  three times daily. If a lower dose is therapeutic, he may remain at that dose.

## 2018-01-18 NOTE — Telephone Encounter (Signed)
Called and spoke with Pt, advsd of lab results, and Gabapentin recommendation with dosing instructions.

## 2018-02-05 ENCOUNTER — Telehealth: Payer: Self-pay | Admitting: *Deleted

## 2018-02-05 DIAGNOSIS — I4891 Unspecified atrial fibrillation: Secondary | ICD-10-CM

## 2018-02-05 NOTE — Telephone Encounter (Signed)
Returned call to wife. Wife concerned that Pt is not on a blood thinner.  Her neurologist suggested to her Pt maybe could be on a blood thinner.  Per review of neuro note-he notes with Pt h/o of bleed, plus history of falls, plus history of alcohol use anticoagulants should be used with caution.  Wife indicates understanding. Wife very concerned amiodarone is almost out of patient system.  Very concerned he will go back into afib and have a stroke. Wife interested in learning more about dofetilide and Watchman device (saw Dr. Rayann Heman on TV).   Will discuss with Dr. [name] and return her call on Friday.

## 2018-02-05 NOTE — Telephone Encounter (Signed)
Pt's wife, [name], requesting a call from Sonia Baller to discuss some things going on with pt, best call back number [phone].

## 2018-02-08 NOTE — Telephone Encounter (Signed)
Returned call to wife to follow up on previous discussion.   Notified wife that Dr. Rayann Heman is no longer putting in [place]. Notified wife that if interested in starting dofetilide, we could set up a lab appt to check Pt's amio level.  If amio level is <.3 Pt would then need an 12 lead EKG.  If the results of these 2 tests were acceptable Pt and wife could meet with Dr. [name] to discuss dofetilide initiation.  Wife indicates understanding.  Will discuss with Pt and call this nurse back.

## 2018-02-08 NOTE — Telephone Encounter (Signed)
Per Pt and wife would like to move forward with evaluation for possible dofetilide. Will schedule Pt for amio level check on February 12, 2018.  Follow up will be based on results of this test.

## 2018-02-11 ENCOUNTER — Other Ambulatory Visit: Payer: Self-pay | Admitting: *Deleted

## 2018-02-11 DIAGNOSIS — I4891 Unspecified atrial fibrillation: Secondary | ICD-10-CM

## 2018-02-12 ENCOUNTER — Other Ambulatory Visit: Payer: Medicare Other

## 2018-02-12 DIAGNOSIS — I4891 Unspecified atrial fibrillation: Secondary | ICD-10-CM

## 2018-02-13 LAB — AMIODARONE LEVEL
AMIODARONE LVL: 0.3 ug/mL — AB (ref 1.0–2.5)
N-DESETHYL-AMIODARONE: 0.3 ug/mL — AB (ref 1.0–2.5)

## 2018-02-13 LAB — DRAW FEE (FINGERSTICK)

## 2018-02-26 ENCOUNTER — Telehealth: Payer: Self-pay | Admitting: Internal Medicine

## 2018-02-26 NOTE — Telephone Encounter (Signed)
Pt was started on Lisinopril 40 mg a day 02/17/2017. 10//16/2018 was decreased to 20 mg a day after a fall. Pt is concerned his BP has been increasing with the systolic being in the 371G-626R for at least 1 month.  Advised this is not at a dangerous level however I will forward to Dr [name] for review and any new orders.  He still complains of feeling "wired" and "lousy" and reports he feels as if this came from the amiodarone which was d/ced some time ago.  He questioned the results of his recent amiodarone level since it was drawn 2 weeks ago and "I never heard the results."  Reviewed result of 0.3 with patient and that per Dr [name] the level will need to be lower than that to proceed to 12 lead ekg and then discussion of initiation of dofetilide.  Pt states understanding.

## 2018-02-26 NOTE — Telephone Encounter (Signed)
New Message:    Pt c/o medication issue:  1. Name of Medication: lisinopril (PRINIVIL,ZESTRIL) 20 MG tablet  2. How are you currently taking this medication (dosage and times per day)? Take 1 tablet (20 mg total) daily by mouth.  3. Are you having a reaction (difficulty breathing--STAT)? No  4. What is your medication issue? Pt requesting dosage change back to 40 mg  Pt c/o BP issue: STAT if pt c/o blurred vision, one-sided weakness or slurred speech  1. What are your last 5 BP readings?  140/69 PR 57  155/89 pr 56 149/87 pr 64 138/77 pr 62 125/69 pr 81 this mornings reading   2. Are you having any other symptoms (ex. Dizziness, headache, blurred vision, passed out)? No  3. What is your BP issue?per pt bp not controlled due to change in medication

## 2018-02-27 MED ORDER — LISINOPRIL 40 MG PO TABS: 40.0000 mg | ORAL_TABLET | Freq: Every day | ORAL | 3 refills | Status: DC

## 2018-02-27 NOTE — Telephone Encounter (Signed)
Pt aware and RX sent into [facility] as requested.

## 2018-02-27 NOTE — Telephone Encounter (Signed)
Follow up    Patient and wife is calling back in reference to BP concerns as well as the medication. Please call to discuss.    Pt c/o medication issue:  1. Name of Medication: lisinopril (PRINIVIL,ZESTRIL) 20 MG tablet  2. How are you currently taking this medication (dosage and times per day)? Take 1 tablet (20 mg total) daily by mouth.  3. Are you having a reaction (difficulty breathing--STAT)? No  4. What is your medication issue? Pt requesting dosage change back to 40 mg  Pt c/o BP issue: STAT if pt c/o blurred vision, one-sided weakness or slurred speech  1. What are your last 5 BP readings?  140/69 PR 57   155/89 pr 56 149/87 pr 64 138/77 pr 62 125/69 pr 81 this mornings reading   2. Are you having any other symptoms (ex. Dizziness, headache, blurred vision, passed out)? No  3. What is your BP issue?per pt bp not controlled due to change in medication

## 2018-02-27 NOTE — Telephone Encounter (Signed)
Increase lisinopril to 40mg  PO QD Candee Furbish, MD

## 2018-02-28 ENCOUNTER — Telehealth: Payer: Self-pay | Admitting: Neurology

## 2018-02-28 NOTE — Telephone Encounter (Signed)
Pt's spouse called and said they feel the gabapentin is not working and pt wants to discontinue use of gabapentin and would like a call back regarding if this is ok or not

## 2018-03-01 NOTE — Telephone Encounter (Signed)
Spoke with Brian Hoover, wife. 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 Since increasing to 300 mg TID, he doesn't feel like it is as effective. Royanne Foots, [per Dr Tomi Likens, may increase to 600 mg TID. She does not want to do that, wants to stop gabapentin. Advsd her 200 mg x3 days, then 100 mg x3 days then d/c. Advsd her if when reducing does he feels better, he can remain at the lower dose.They will call if any further questions or worsening symptoms.

## 2018-03-28 ENCOUNTER — Encounter: Payer: Self-pay | Admitting: Neurology

## 2018-04-01 ENCOUNTER — Ambulatory Visit: Payer: Medicare Other | Admitting: Internal Medicine

## 2018-04-01 ENCOUNTER — Encounter: Payer: Self-pay | Admitting: Internal Medicine

## 2018-04-01 VITALS — BP 120/82 | HR 62 | Ht 70.0 in | Wt 181.2 lb

## 2018-04-01 DIAGNOSIS — I493 Ventricular premature depolarization: Secondary | ICD-10-CM

## 2018-04-01 DIAGNOSIS — I453 Trifascicular block: Secondary | ICD-10-CM

## 2018-04-01 DIAGNOSIS — I480 Paroxysmal atrial fibrillation: Secondary | ICD-10-CM | POA: Diagnosis not present

## 2018-04-01 DIAGNOSIS — I5022 Chronic systolic (congestive) heart failure: Secondary | ICD-10-CM | POA: Diagnosis not present

## 2018-04-01 NOTE — Patient Instructions (Addendum)
Medication Instructions:  Your physician recommends that you continue on your current medications as directed. Please refer to the Current Medication list given to you today.  Labwork: None ordered.  Testing/Procedures: None ordered.  Follow-Up: Patient and wife to discuss starting dofetilide-wish to call insurance company to discover copay.  Will call this nurse tomorrow 04/02/2018 to notify if they wish to proceed with dofetilide initiation.  Any Other Special Instructions Will Be Listed Below (If Applicable).  If you need a refill on your cardiac medications before your next appointment, please call your pharmacy.

## 2018-04-01 NOTE — Progress Notes (Addendum)
HPI [name] returns today for ongoing evaluation and management of PAF. He has a h/o PVC induced CM, s/p ablation, h/o GI bleeding on anti-coagulation and intolerance to amiodarone. The patient has appeared to maintain NSR despite being off of amiodarone for over 3 months. He has not fallen. He has had symptomatic bradycardia but none recently as he is off of all sinus and AV nodal blocking drugs with the exception of very low dose coreg.  Allergies  Allergen Reactions  . Sulfa Antibiotics Hives    Hives      Current Outpatient Medications  Medication Sig Dispense Refill  . acetaminophen (TYLENOL) 325 MG tablet Take 2 tablets (650 mg total) by mouth every 6 (six) hours as needed for mild pain (or Fever >/= 101). 10 tablet 0  . aspirin EC 81 MG tablet Take 81 mg by mouth daily.    . carvedilol (COREG) 3.125 MG tablet Take 1 tablet (3.125 mg total) by mouth daily. 30 tablet 6  . cholecalciferol (VITAMIN D) 1000 UNITS tablet Take 2,000 Units by mouth daily.     Marland Kitchen FLUZONE HIGH-DOSE 0.5 ML injection Inject as directed daily.  0  . gabapentin (NEURONTIN) 100 MG capsule Take 1 capsule (100 mg total) by mouth 3 (three) times daily. May increase to 2 caps (200 mg) TID in one week if needed; then 3 caps (300 mg) TID on week 3 if needed. 270 capsule 3  . hydroxypropyl methylcellulose / hypromellose (ISOPTO TEARS / GONIOVISC) 2.5 % ophthalmic solution Place 1 drop into both eyes as needed for dry eyes.    . Lidocaine 4 % PTCH Apply 1 patch topically daily as needed (pain).    Marland Kitchen lisinopril (PRINIVIL,ZESTRIL) 40 MG tablet Take 1 tablet (40 mg total) by mouth daily. 90 tablet 3  . Multiple Vitamins-Minerals (MULTIVITAMIN WITH MINERALS) tablet Take 1 tablet by mouth daily.    . nortriptyline (PAMELOR) 25 MG capsule TAKE 3 CAPSULES BY MOUTH AT BEDTIME 270 capsule 1  . OLANZapine (ZYPREXA) 10 MG tablet Take 10 mg by mouth at bedtime as needed (as needed to ward off  cluster headaches.).     Marland Kitchen  valACYclovir (VALTREX) 1000 MG tablet Take 1 g by mouth daily.     No current facility-administered medications for this visit.      Past Medical History:  Diagnosis Date  . Atrial fibrillation ([place])   . Back pain   . Bradycardia   . CHF (congestive heart failure) ([place])   . Chronic systolic heart failure ([place])   . COPD (chronic obstructive pulmonary disease) ([place])   . GERD (gastroesophageal reflux disease)   . Hypertension   . MGUS (monoclonal gammopathy of unknown significance) 01/25/2015  . Migraine   . Pneumothorax 01/28/2013  . PVC (premature ventricular contraction)   . Syncope    s/p Medtronic ILR implant 05/2013 by Dr [name]  . Trifascicular block     ROS:   All systems reviewed and negative except as noted in the HPI.   Past Surgical History:  Procedure Laterality Date  . CHEST TUBE INSERTION Right 01/29/2013  . CLAVICLE SURGERY    . LOOP RECORDER IMPLANT  06/02/2013   Medtronic LinQ implanted for syncope by Dr [name]  . LOOP RECORDER IMPLANT N/A 06/02/2013   Procedure: LOOP RECORDER IMPLANT;  Surgeon: [name], MD;  Location: [facility] CATH LAB;  Service: Cardiovascular;  Laterality: N/A;  . PROSTATE SURGERY    . SHOULDER ARTHROSCOPY    .  TENDON REPAIR     right foot     Family History  Problem Relation Age of Onset  . Breast cancer Mother   . Uterine cancer Mother   . Rheum arthritis Son      Social History   Socioeconomic History  . Marital status: Married    Spouse name: [name]  . Number of children: 2  . Years of education: MA  . Highest education level: Not on file  Occupational History  . Occupation: retired Glass blower/designer  . Financial resource strain: Not on file  . Food insecurity:    Worry: Not on file    Inability: Not on file  . Transportation needs:    Medical: Not on file    Non-medical: Not on file  Tobacco Use  . Smoking status: Former Smoker    Packs/day: 1.50    Years: 10.00    Pack years: 15.00    Types: Cigarettes     Last attempt to quit: 12/04/1960    Years since quitting: 57.3  . Smokeless tobacco: Never Used  Substance and Sexual Activity  . Alcohol use: Yes    Alcohol/week: 6.0 oz    Types: 10 Shots of liquor per week    Comment: 4 Drinks a week  . Drug use: No  . Sexual activity: Yes    Partners: Female    Birth control/protection: None  Lifestyle  . Physical activity:    Days per week: Not on file    Minutes per session: Not on file  . Stress: Not on file  Relationships  . Social connections:    Talks on phone: Not on file    Gets together: Not on file    Attends religious service: Not on file    Active member of club or organization: Not on file    Attends meetings of clubs or organizations: Not on file    Relationship status: Not on file  . Intimate partner violence:    Fear of current or ex partner: Not on file    Emotionally abused: Not on file    Physically abused: Not on file    Forced sexual activity: Not on file  Other Topics Concern  . Not on file  Social History Narrative      Pt lives at home with his spouse.   Caffeine Use: 2 cups daily.     BP 120/82   Pulse 62   Ht 5\' 10"  (1.778 m)   Wt 181 lb 3.2 oz (82.2 kg)   SpO2 96%   BMI 26.00 kg/m   Physical Exam:  stable appearing 84 yo man, NAD HEENT: Unremarkable Neck:  6 cm JVD, no thyromegally Lymphatics:  No adenopathy Back:  No CVA tenderness Lungs:  Clear with no wheezes HEART:  Regular rate rhythm, no murmurs, no rubs, no clicks, split S2 Abd:  soft, positive bowel sounds, no organomegally, no rebound, no guarding Ext:  2 plus pulses, no edema, no cyanosis, no clubbing Skin:  No rashes no nodules Neuro:  CN II through XII intact, motor grossly intact  EKG - NSR with first degree AV block, RBBB, and LAD   Assess/Plan: 1. Atrial fib - he is still in NSR. I have discussed the treatment options. Because he is in rhythm and cannot be anti-coagulated, I have recommended he be admitted for initiation  of dofetilide therapy. He will call us if he wishes to proceed. I have explained that once he has gone back  to atrial fib, then we will not be able to do this unless he undergoes a TEE and 4 weeks of anti-coagulation. 2. Symptomatic bradycardia - he is currently asymptomatic and his rates are ok. He will undergo watchful waiting. 3. HTN - his pressure is well controlled today. Will follow. 4. Chronic systolic heart failure - he is on low dose afterload reduction and beta blockade and is class 2. No change in meds for now.  Mikle Bosworth.D.

## 2018-04-02 ENCOUNTER — Telehealth: Payer: Self-pay | Admitting: Pharmacist

## 2018-04-02 ENCOUNTER — Telehealth: Payer: Self-pay | Admitting: Internal Medicine

## 2018-04-02 NOTE — Telephone Encounter (Signed)
Medication list reviewed in anticipation of upcoming Tikosyn initiation. Patient is taking olanzapine, which is QTc prolonging. Looks as though patient is taking this prn for cluster headaches rather than for a psychiatric indication. Would see how frequently patient is using this and if he is able to discontinue therapy. Amiodarone level drawn last month was acceptable at 0.3.  Patient is not currently on anticoagulation therapy due to history of GI bleed. Per Dr Tanna Furry note: "Because he is in rhythm and cannot be anti-coagulated, I have recommended he be admitted for initiation of dofetilide therapy. I have explained that once he has gone back to atrial fib, then we will not be able to do this unless he undergoes a TEE and 4 weeks of anti-coagulation."  Patient will need to be counseled to avoid use of Benadryl while on Tikosyn and in the 2-3 days prior to Tikosyn initiation.

## 2018-04-02 NOTE — Telephone Encounter (Signed)
New Message   Patient is calling to advise that he wants to be admitted to the hospital on Monday May 6th. Please call to discuss.

## 2018-04-02 NOTE — Telephone Encounter (Signed)
Spoke with Pt.  Pt got monthly quote for dofetilide, will cost him just under $100 a month.  Pt states he can afford that. Scheduled Pt with afib clinic on Apr 08, 2018 @ 11:45 am for tikosyn admission. Attempted to give direction to afib clinic, notified Pt afib clinic staff will be in touch with him and give him addl direction. No further action at this time.

## 2018-04-03 ENCOUNTER — Ambulatory Visit: Payer: Medicare Other | Admitting: Internal Medicine

## 2018-04-03 ENCOUNTER — Telehealth: Payer: Self-pay | Admitting: Internal Medicine

## 2018-04-03 NOTE — Telephone Encounter (Signed)
New Message   Patient is calling in reference to his upcoming admission on 04/08/2018. He states he was advised that the hospital will contact him but he has yet to be contacted. He wants to know should he be concerned. Please call to discuss.

## 2018-04-04 NOTE — Telephone Encounter (Signed)
Pt states he has not taken zyprexa in months and is fine without it.

## 2018-04-04 NOTE — Telephone Encounter (Signed)
Per Afib clinic, Pt contacted today.  No action needed.

## 2018-04-08 ENCOUNTER — Inpatient Hospital Stay (HOSPITAL_COMMUNITY)
Admission: RE | Admit: 2018-04-08 | Discharge: 2018-04-11 | DRG: 309 | Disposition: A | Discharge status: Home / Self Care | Payer: Medicare Other | Source: Ambulatory Visit | Attending: Internal Medicine | Admitting: Internal Medicine

## 2018-04-08 ENCOUNTER — Ambulatory Visit (HOSPITAL_BASED_OUTPATIENT_CLINIC_OR_DEPARTMENT_OTHER)
Admission: RE | Admit: 2018-04-08 | Discharge: 2018-04-08 | Disposition: A | Discharge status: Home / Self Care | Payer: Medicare Other | Source: Ambulatory Visit | Attending: Nurse Practitioner | Admitting: Nurse Practitioner

## 2018-04-08 ENCOUNTER — Encounter (HOSPITAL_COMMUNITY): Payer: Self-pay | Admitting: Nurse Practitioner

## 2018-04-08 ENCOUNTER — Other Ambulatory Visit: Payer: Self-pay

## 2018-04-08 VITALS — BP 142/76 | HR 59 | Ht 70.0 in | Wt 176.0 lb

## 2018-04-08 DIAGNOSIS — Z87891 Personal history of nicotine dependence: Secondary | ICD-10-CM

## 2018-04-08 DIAGNOSIS — Z803 Family history of malignant neoplasm of breast: Secondary | ICD-10-CM

## 2018-04-08 DIAGNOSIS — I5042 Chronic combined systolic (congestive) and diastolic (congestive) heart failure: Secondary | ICD-10-CM | POA: Diagnosis present

## 2018-04-08 DIAGNOSIS — K219 Gastro-esophageal reflux disease without esophagitis: Secondary | ICD-10-CM | POA: Diagnosis present

## 2018-04-08 DIAGNOSIS — I428 Other cardiomyopathies: Secondary | ICD-10-CM | POA: Diagnosis present

## 2018-04-08 DIAGNOSIS — I480 Paroxysmal atrial fibrillation: Principal | ICD-10-CM

## 2018-04-08 DIAGNOSIS — I110 Hypertensive heart disease with heart failure: Secondary | ICD-10-CM | POA: Diagnosis present

## 2018-04-08 DIAGNOSIS — Z8049 Family history of malignant neoplasm of other genital organs: Secondary | ICD-10-CM

## 2018-04-08 DIAGNOSIS — J449 Chronic obstructive pulmonary disease, unspecified: Secondary | ICD-10-CM | POA: Diagnosis present

## 2018-04-08 DIAGNOSIS — Z882 Allergy status to sulfonamides status: Secondary | ICD-10-CM | POA: Diagnosis not present

## 2018-04-08 DIAGNOSIS — I453 Trifascicular block: Secondary | ICD-10-CM | POA: Diagnosis present

## 2018-04-08 DIAGNOSIS — I495 Sick sinus syndrome: Secondary | ICD-10-CM | POA: Diagnosis present

## 2018-04-08 DIAGNOSIS — Z5181 Encounter for therapeutic drug level monitoring: Secondary | ICD-10-CM

## 2018-04-08 DIAGNOSIS — I4510 Unspecified right bundle-branch block: Secondary | ICD-10-CM | POA: Diagnosis present

## 2018-04-08 DIAGNOSIS — Z8261 Family history of arthritis: Secondary | ICD-10-CM

## 2018-04-08 DIAGNOSIS — Z79899 Other long term (current) drug therapy: Secondary | ICD-10-CM | POA: Diagnosis not present

## 2018-04-08 DIAGNOSIS — Z7982 Long term (current) use of aspirin: Secondary | ICD-10-CM | POA: Diagnosis not present

## 2018-04-08 LAB — BASIC METABOLIC PANEL
Anion gap: 9 (ref 5–15)
BUN: 21 mg/dL — AB (ref 6–20)
CO2: 27 mmol/L (ref 22–32)
Calcium: 9.2 mg/dL (ref 8.9–10.3)
Chloride: 95 mmol/L — ABNORMAL LOW (ref 101–111)
Creatinine, Ser: 1.01 mg/dL (ref 0.61–1.24)
GFR calc Af Amer: >60 mL/min (ref >60)
GFR calc non Af Amer: >60 mL/min (ref >60)
GLUCOSE: 90 mg/dL (ref 65–99)
POTASSIUM: 4.7 mmol/L (ref 3.5–5.1)
Sodium: 131 mmol/L — ABNORMAL LOW (ref 135–145)

## 2018-04-08 LAB — MAGNESIUM: Magnesium: 2.1 mg/dL (ref 1.7–2.4)

## 2018-04-08 MED ORDER — BISACODYL 5 MG PO TBEC: 5.0000 mg | DELAYED_RELEASE_TABLET | Freq: Every day | ORAL | Status: DC | PRN

## 2018-04-08 MED ORDER — SODIUM CHLORIDE 0.9% FLUSH
3.0000 mL | Freq: Two times a day (BID) | INTRAVENOUS | Status: DC
  Administered 2018-04-08 – 2018-04-10 (×4): 3 mL via INTRAVENOUS

## 2018-04-08 MED ORDER — SODIUM CHLORIDE 0.9% FLUSH: 3.0000 mL | INTRAVENOUS | Status: DC | PRN

## 2018-04-08 MED ORDER — DOFETILIDE 500 MCG PO CAPS: 500.0000 ug | ORAL_CAPSULE | Freq: Two times a day (BID) | ORAL | Status: DC

## 2018-04-08 MED ORDER — GABAPENTIN 100 MG PO CAPS
100.0000 mg | ORAL_CAPSULE | Freq: Three times a day (TID) | ORAL | Status: DC
  Administered 2018-04-09: 100 mg via ORAL
  Filled 2018-04-08: qty 1

## 2018-04-08 MED ORDER — LISINOPRIL 40 MG PO TABS
40.0000 mg | ORAL_TABLET | Freq: Every day | ORAL | Status: DC
  Administered 2018-04-09 – 2018-04-11 (×3): 40 mg via ORAL
  Filled 2018-04-08 (×3): qty 1

## 2018-04-08 MED ORDER — ASPIRIN EC 81 MG PO TBEC
81.0000 mg | DELAYED_RELEASE_TABLET | Freq: Every day | ORAL | Status: DC
  Administered 2018-04-09 – 2018-04-11 (×3): 81 mg via ORAL
  Filled 2018-04-08 (×3): qty 1

## 2018-04-08 MED ORDER — DOFETILIDE 250 MCG PO CAPS
250.0000 ug | ORAL_CAPSULE | Freq: Two times a day (BID) | ORAL | Status: DC
  Administered 2018-04-08 – 2018-04-11 (×6): 250 ug via ORAL
  Filled 2018-04-08 (×5): qty 1
  Filled 2018-04-08: qty 14
  Filled 2018-04-08: qty 1

## 2018-04-08 MED ORDER — VALACYCLOVIR HCL 500 MG PO TABS
1000.0000 mg | ORAL_TABLET | Freq: Every day | ORAL | Status: DC
  Administered 2018-04-09 – 2018-04-11 (×3): 1000 mg via ORAL
  Filled 2018-04-08 (×3): qty 2

## 2018-04-08 MED ORDER — DOCUSATE SODIUM 100 MG PO CAPS
100.0000 mg | ORAL_CAPSULE | Freq: Two times a day (BID) | ORAL | Status: DC
  Filled 2018-04-08 (×2): qty 1

## 2018-04-08 MED ORDER — SODIUM CHLORIDE 0.9 % IV SOLN: 250.0000 mL | INTRAVENOUS | Status: DC | PRN

## 2018-04-08 MED ORDER — CARVEDILOL 3.125 MG PO TABS
3.1250 mg | ORAL_TABLET | Freq: Every day | ORAL | Status: DC
  Administered 2018-04-09: 3.125 mg via ORAL
  Filled 2018-04-08: qty 1

## 2018-04-08 NOTE — Progress Notes (Signed)
Dr.Mclean notified about patients order for coreg for parameters. He stated not to give Coreg if HR <60. I also had sent a text page to Dr. Aundra Dubin about patients indigestion which was not addressed.

## 2018-04-08 NOTE — Progress Notes (Signed)
Pharmacy Review for Dofetilide (Tikosyn) Initiation  Admit Complaint: 84 y.o. male admitted 04/08/2018 with atrial fibrillation to be initiated on dofetilide.   Assessment:  Patient Exclusion Criteria: If any screening criteria checked as "Yes", then  patient  should NOT receive dofetilide until criteria item is corrected. If "Yes" please indicate correction plan.  YES  NO Patient  Exclusion Criteria Correction Plan  [x]  []  Baseline QTc interval is greater than or equal to 440 msec. IF above YES box checked dofetilide contraindicated unless patient has ICD; then may proceed if QTc 500-550 msec or with known ventricular conduction abnormalities may proceed with QTc 550-600 msec. QTc = 0.36   Per H&P, EP states current QTc 476 is acceptable to start   []  [x]  Magnesium level is less than 1.8 mEq/l : Last magnesium:  Lab Results  Component Value Date   MG 2.1 04/08/2018         []  [x]  Potassium level is less than 4 mEq/l : Last potassium:  Lab Results  Component Value Date   K 4.7 04/08/2018         []  [x]  Patient is known or suspected to have a digoxin level greater than 2 ng/ml: No results found for: DIGOXIN    []  [x]  Creatinine clearance less than 20 ml/min (calculated using Cockcroft-Gault, actual body weight and serum creatinine): Estimated Creatinine Clearance: 56.2 mL/min (by C-G formula based on SCr of 1.01 mg/dL).    []  [x]  Patient has received drugs known to prolong the QT intervals within the last 48 hours (phenothiazines, tricyclics or tetracyclic antidepressants, erythromycin, H-1 antihistamines, cisapride, fluoroquinolones, azithromycin). Drugs not listed above may have an, as yet, undetected potential to prolong the QT interval, updated information on QT prolonging agents is available at this website:QT prolonging agents   []  [x]  Patient received a dose of hydrochlorothiazide (Oretic) alone or in any combination including triamterene (Dyazide, Maxzide) in the last 48  hours.   []  [x]  Patient received a medication known to increase dofetilide plasma concentrations prior to initial dofetilide dose:  . Trimethoprim (Primsol, Proloprim) in the last 36 hours . Verapamil (Calan, Verelan) in the last 36 hours or a sustained release dose in the last 72 hours . Megestrol (Megace) in the last 5 days  . Cimetidine (Tagamet) in the last 6 hours . Ketoconazole (Nizoral) in the last 24 hours . Itraconazole (Sporanox) in the last 48 hours  . Prochlorperazine (Compazine) in the last 36 hours    []  [x]  Patient is known to have a history of torsades de pointes; congenital or acquired long QT syndromes.   []  [x]  Patient has received a Class 1 antiarrhythmic with less than 2 half-lives since last dose. (Disopyramide, Quinidine, Procainamide, Lidocaine, Mexiletine, Flecainide, Propafenone)   []  [x]  Patient has received amiodarone therapy in the past 3 months or amiodarone level is greater than 0.3 ng/ml.    Patient has been appropriately anticoagulated with (unable to anticoagulate due to GI bleeds).  Ordering provider was confirmed at [email or web address] if they are not listed on the [place] Prescribers list.  Goal of Therapy: Follow renal function, electrolytes, potential drug interactions, and dose adjustment. Provide education and 1 week supply at discharge.  Plan:  [x]   Physician selected initial dose within range recommended for patients level of renal function - will monitor for response.  []   Physician selected initial dose outside of range recommended for patients level of renal function - will discuss if the dose should be altered at this  time.   Select One Calculated CrCl  Dose q12h  []  > 60 ml/min 500 mcg  [x]  40-60 ml/min 250 mcg  []  20-40 ml/min 125 mcg   2. Follow up QTc after the first 5 doses, renal function, electrolytes (K & Mg) daily x 3     days, dose adjustment, success of initiation and facilitate 1 week discharge supply as      clinically indicated.  3. Initiate Tikosyn education video (Call [phone] and ask for video # 116).  4. Place Enrollment Form on the chart for discharge supply of dofetilide.  Theotis Burrow 5:36 PM 04/08/2018

## 2018-04-08 NOTE — Progress Notes (Signed)
Primary Care Physician: [name], MD Referring Physician:Dr. Srihith Aquilino is a 84 y.o. male with a h/o afib,HTN, CHF, GI bleed not on anticoagulation. He had been on amiodarone, off with level of 0.3 for a while now. He is here to be admitted for Tikosyn. Since he is off anticoagulation for previous GI bleed, he could only be admitted for Tikosyn if he is in [place], per Dr. [name]. He is in [place] today.  Today, he denies symptoms of palpitations, chest pain, shortness of breath, orthopnea, PND, lower extremity edema, dizziness, presyncope, syncope, or neurologic sequela. The patient is tolerating medications without difficulties and is otherwise without complaint today.   Past Medical History:  Diagnosis Date  . Atrial fibrillation ([place])   . Back pain   . Bradycardia   . CHF (congestive heart failure) ([place])   . Chronic systolic heart failure ([place])   . COPD (chronic obstructive pulmonary disease) ([place])   . GERD (gastroesophageal reflux disease)   . Hypertension   . MGUS (monoclonal gammopathy of unknown significance) 01/25/2015  . Migraine   . Pneumothorax 01/28/2013  . PVC (premature ventricular contraction)   . Syncope    s/p Medtronic ILR implant 05/2013 by Dr [name]  . Trifascicular block    Past Surgical History:  Procedure Laterality Date  . CHEST TUBE INSERTION Right 01/29/2013  . CLAVICLE SURGERY    . LOOP RECORDER IMPLANT  06/02/2013   Medtronic LinQ implanted for syncope by Dr [name]  . LOOP RECORDER IMPLANT N/A 06/02/2013   Procedure: LOOP RECORDER IMPLANT;  Surgeon: [name], MD;  Location: [facility] CATH LAB;  Service: Cardiovascular;  Laterality: N/A;  . PROSTATE SURGERY    . SHOULDER ARTHROSCOPY    . TENDON REPAIR     right foot    Current Outpatient Medications  Medication Sig Dispense Refill  . acetaminophen (TYLENOL) 325 MG tablet Take 2 tablets (650 mg total) by mouth every 6 (six) hours as needed for mild pain (or Fever >/= 101). 10 tablet 0  .  aspirin EC 81 MG tablet Take 81 mg by mouth daily.    . bisacodyl (DULCOLAX) 5 MG EC tablet Take 5 mg by mouth daily as needed for moderate constipation.    . carvedilol (COREG) 3.125 MG tablet Take 1 tablet (3.125 mg total) by mouth daily. 30 tablet 6  . cholecalciferol (VITAMIN D) 1000 UNITS tablet Take 2,000 Units by mouth daily.     Mariane Baumgarten Sodium (STOOL SOFTENER) 100 MG capsule Take 100 mg by mouth 2 (two) times daily.    Marland Kitchen FLUZONE HIGH-DOSE 0.5 ML injection Inject as directed daily.  0  . gabapentin (NEURONTIN) 100 MG capsule Take 1 capsule (100 mg total) by mouth 3 (three) times daily. May increase to 2 caps (200 mg) TID in one week if needed; then 3 caps (300 mg) TID on week 3 if needed. 270 capsule 3  . lisinopril (PRINIVIL,ZESTRIL) 40 MG tablet Take 1 tablet (40 mg total) by mouth daily. 90 tablet 3  . Multiple Vitamins-Minerals (MULTIVITAMIN WITH MINERALS) tablet Take 1 tablet by mouth daily.    . valACYclovir (VALTREX) 1000 MG tablet Take 1 g by mouth daily.     No current facility-administered medications for this encounter.     Allergies  Allergen Reactions  . Sulfa Antibiotics Hives    Hives     Social History   Socioeconomic History  . Marital status: Married    Spouse name: [name]  .  Number of children: 2  . Years of education: MA  . Highest education level: Not on file  Occupational History  . Occupation: retired Glass blower/designer  . Financial resource strain: Not on file  . Food insecurity:    Worry: Not on file    Inability: Not on file  . Transportation needs:    Medical: Not on file    Non-medical: Not on file  Tobacco Use  . Smoking status: Former Smoker    Packs/day: 1.50    Years: 10.00    Pack years: 15.00    Types: Cigarettes    Last attempt to quit: 12/04/1960    Years since quitting: 57.3  . Smokeless tobacco: Never Used  Substance and Sexual Activity  . Alcohol use: Yes    Alcohol/week: 6.0 oz    Types: 10 Shots of liquor per week      Comment: 4 Drinks a week  . Drug use: No  . Sexual activity: Yes    Partners: Female    Birth control/protection: None  Lifestyle  . Physical activity:    Days per week: Not on file    Minutes per session: Not on file  . Stress: Not on file  Relationships  . Social connections:    Talks on phone: Not on file    Gets together: Not on file    Attends religious service: Not on file    Active member of club or organization: Not on file    Attends meetings of clubs or organizations: Not on file    Relationship status: Not on file  . Intimate partner violence:    Fear of current or ex partner: Not on file    Emotionally abused: Not on file    Physically abused: Not on file    Forced sexual activity: Not on file  Other Topics Concern  . Not on file  Social History Narrative      Pt lives at home with his spouse.   Caffeine Use: 2 cups daily.    Family History  Problem Relation Age of Onset  . Breast cancer Mother   . Uterine cancer Mother   . Rheum arthritis Son     ROS- All systems are reviewed and negative except as per the HPI above  Physical Exam: Vitals:   04/08/18 1135  BP: (!) 142/76  Pulse: (!) 59  Weight: 176 lb (79.8 kg)  Height: 5\' 10"  (1.778 m)   Wt Readings from Last 3 Encounters:  04/08/18 176 lb (79.8 kg)  04/01/18 181 lb 3.2 oz (82.2 kg)  01/16/18 174 lb 9.6 oz (79.2 kg)    Labs: Lab Results  Component Value Date   NA 137 01/02/2018   K 4.5 01/02/2018   CL 97 01/02/2018   CO2 22 01/02/2018   GLUCOSE 97 01/02/2018   BUN 18 01/02/2018   CREATININE 1.05 01/02/2018   CALCIUM 9.5 01/02/2018   MG 2.0 09/15/2017   Lab Results  Component Value Date   INR 0.92 01/29/2013   Lab Results  Component Value Date   CHOL 172 09/16/2017   HDL 64 09/16/2017   LDLCALC 93 09/16/2017   TRIG 75 09/16/2017     GEN- The patient is well appearing, alert and oriented x 3 today.   Head- normocephalic, atraumatic Eyes-  Sclera clear, conjunctiva  pink Ears- hearing intact Oropharynx- clear Neck- supple, no JVP Lymph- no cervical lymphadenopathy Lungs- Clear to ausculation bilaterally, normal work of breathing Heart- Regular  rate and rhythm, no murmurs, rubs or gallops, PMI not laterally displaced GI- soft, NT, ND, + BS Extremities- no clubbing, cyanosis, or edema MS- no significant deformity or atrophy Skin- no rash or lesion Psych- euthymic mood, full affect Neuro- strength and sensation are intact  EKG-Sinus brady with first degree AV block at 238 ms, qrs int 164 ms, qtc 469 ms RBBB, LAFB,     Assessment and Plan: 1. Persistent afib General education re tikosyn use Off amiodarone Amio level 3/12 at 0.3 February 12, 2018 Off anticoagulation 2/2 GI bleeds IN SR today, as per Dr. [name], pt will be admitted for tikosyn if he is in [place]  No benadryl  Can afford drug PharmD reviewed meds He is no longer using olanzapine, which is qtc prolonging Bmet/mag reveals normal K+/Mag, crcl cal at 60.84  Admission pending when room available later today  Butch Penny C. Chadwin Fury, [facility] [address] [place], [place] [phone]

## 2018-04-08 NOTE — H&P (Addendum)
Cardiology Admission History and Physical:   Patient ID: Brian Hoover; MRN: [id number]; DOB: August 10, 1932   Admission date: 04/08/2018  Primary Care Provider: [name], MD Primary Cardiologist: Dr. [name]    Chief Complaint:  Tikosyn initiation  Patient Profile:   Brian Hoover is a 84 y.o. male with a history of HTN, hx of PVC induced CM s/p ablation, CHF, COPD, hx of symptomatic bradycardia that improved off nodal blocking medicines, and AFib  History of Present Illness:   Brian Hoover saw Dr. [name] recently 04/01/18, at that time he had been off amiodarone 3 months, was maintaining SR, and given unable to a/c 2/2 GIB's, felt it best to add a AAD in effort to reduce his AF as much as possible, and recommended Tikosyn.  He saw Brian Palau, NP today for Tikosyn intitaion.  Tikosyn education was given, he was in [place]. His last amiodarone level was on 02/12/18 was 0.3, he has been off drug since 12/31/17.  He is feeling well, denies any CP, palpitations or SOB, no recent illness or fever.  AAD Hx Amiodarone developed dizziness, leg weakness felt to be 2/2 drug, stopped 12/31/17   Past Medical History:  Diagnosis Date  . Atrial fibrillation ([place])   . Back pain   . Bradycardia   . CHF (congestive heart failure) ([place])   . Chronic systolic heart failure ([place])   . COPD (chronic obstructive pulmonary disease) ([place])   . GERD (gastroesophageal reflux disease)   . Hypertension   . MGUS (monoclonal gammopathy of unknown significance) 01/25/2015  . Migraine   . Pneumothorax 01/28/2013  . PVC (premature ventricular contraction)   . Syncope    s/p Medtronic ILR implant 05/2013 by Dr [name]  . Trifascicular block     Past Surgical History:  Procedure Laterality Date  . CHEST TUBE INSERTION Right 01/29/2013  . CLAVICLE SURGERY    . LOOP RECORDER IMPLANT  06/02/2013   Medtronic LinQ implanted for syncope by Dr [name]  . LOOP RECORDER IMPLANT N/A 06/02/2013   Procedure: LOOP RECORDER  IMPLANT;  Surgeon: [name], MD;  Location: [facility] CATH LAB;  Service: Cardiovascular;  Laterality: N/A;  . PROSTATE SURGERY    . SHOULDER ARTHROSCOPY    . TENDON REPAIR     right foot     Medications Prior to Admission: Prior to Admission medications   Medication Sig Start Date End Date Taking? Authorizing Provider  acetaminophen (TYLENOL) 325 MG tablet Take 2 tablets (650 mg total) by mouth every 6 (six) hours as needed for mild pain (or Fever >/= 101). 09/18/17   Regalado, Belkys A, MD  aspirin EC 81 MG tablet Take 81 mg by mouth daily.    [provider]  bisacodyl (DULCOLAX) 5 MG EC tablet Take 5 mg by mouth daily as needed for moderate constipation.    [provider]  carvedilol (COREG) 3.125 MG tablet Take 1 tablet (3.125 mg total) by mouth daily. 12/31/17   Jerline Pain, MD  cholecalciferol (VITAMIN D) 1000 UNITS tablet Take 2,000 Units by mouth daily.     [provider]  Docusate Sodium (STOOL SOFTENER) 100 MG capsule Take 100 mg by mouth 2 (two) times daily.    [provider]  FLUZONE HIGH-DOSE 0.5 ML injection Inject as directed daily. 07/26/17   [provider]  gabapentin (NEURONTIN) 100 MG capsule Take 1 capsule (100 mg total) by mouth 3 (three) times daily. May increase to 2 caps (200 mg) TID  in one week if needed; then 3 caps (300 mg) TID on week 3 if needed. 01/18/18   Pieter Partridge, DO  lisinopril (PRINIVIL,ZESTRIL) 40 MG tablet Take 1 tablet (40 mg total) by mouth daily. 02/27/18   Jerline Pain, MD  Multiple Vitamins-Minerals (MULTIVITAMIN WITH MINERALS) tablet Take 1 tablet by mouth daily.    [provider]  valACYclovir (VALTREX) 1000 MG tablet Take 1 g by mouth daily. 12/11/17   [provider]     Allergies:    Allergies  Allergen Reactions  . Sulfa Antibiotics Hives    Hives     Social History:   Social History   Socioeconomic History  . Marital status: Married    Spouse name: Brian Hoover  .  Number of children: 2  . Years of education: MA  . Highest education level: Not on file  Occupational History  . Occupation: retired Glass blower/designer  . Financial resource strain: Not on file  . Food insecurity:    Worry: Not on file    Inability: Not on file  . Transportation needs:    Medical: Not on file    Non-medical: Not on file  Tobacco Use  . Smoking status: Former Smoker    Packs/day: 1.50    Years: 10.00    Pack years: 15.00    Types: Cigarettes    Last attempt to quit: 12/04/1960    Years since quitting: 57.3  . Smokeless tobacco: Never Used  Substance and Sexual Activity  . Alcohol use: Yes    Alcohol/week: 6.0 oz    Types: 10 Shots of liquor per week    Comment: 4 Drinks a week  . Drug use: No  . Sexual activity: Yes    Partners: Female    Birth control/protection: None  Lifestyle  . Physical activity:    Days per week: Not on file    Minutes per session: Not on file  . Stress: Not on file  Relationships  . Social connections:    Talks on phone: Not on file    Gets together: Not on file    Attends religious service: Not on file    Active member of club or organization: Not on file    Attends meetings of clubs or organizations: Not on file    Relationship status: Not on file  . Intimate partner violence:    Fear of current or ex partner: Not on file    Emotionally abused: Not on file    Physically abused: Not on file    Forced sexual activity: Not on file  Other Topics Concern  . Not on file  Social History Narrative      Pt lives at home with his spouse.   Caffeine Use: 2 cups daily.    Family History:   The patient's family history includes Breast cancer in his mother; Rheum arthritis in his son; Uterine cancer in his mother.    ROS:  Please see the history of present illness.  All other ROS reviewed and negative.     Physical Exam/Data:   Vitals:   04/08/18 1647  BP: 123/74  Pulse: 60  Temp: 98.3 F (36.8 C)  TempSrc: Oral    SpO2: 96%   No intake or output data in the 24 hours ending 04/08/18 2123 There were no vitals filed for this visit. There is no height or weight on file to calculate BMI.  General:  Well nourished, well developed, in no acute  distress HEENT: normal Lymph: no adenopathy Neck: no JVD Endocrine:  No thryomegaly Vascular: No carotid bruits  Cardiac:  RRR; no murmurs, gallops or rubs Lungs:  CTA b/l, no wheezing, rhonchi or rales  Abd: soft, nontender  Ext: no edema Musculoskeletal:  No deformities, age appropriate atrophy Skin: warm and dry  Neuro:  no focal abnormalities noted Psych:  Normal affect    EKG:  The ECG that was done was personally reviewed and demonstrates  SB 59bpm, 1st degree AVBlock, PR 269ms, RBBB, QT is 472ms, QTc 491ms  Relevant CV Studies:  09/16/17: TTE Study Conclusions - Left ventricle: The cavity size was normal. Wall thickness was   increased in a pattern of moderate LVH. Systolic function was   mildly to moderately reduced. The estimated ejection fraction was   in the range of 40% to 45%. Inferior akinesis. The study is not   technically sufficient to allow evaluation of LV diastolic   function. - Aortic valve: Trileaflet. Sclerosis without stenosis. There was   no regurgitation. - Mitral valve: Calcified annulus. Mildly thickened leaflets .   There was trivial regurgitation. - Left atrium: The atrium was normal in size. - Tricuspid valve: There was trivial regurgitation. - Pulmonary arteries: PA peak pressure: 19 mm Hg (S). - Systemic veins: The IVC was not visualized. Impressions: - Compared to a prior echo in 06/2017, the LVEF is improved to   40-45% (from 35-40%) with inferior akinesis.  Laboratory Data:  Chemistry Recent Labs  Lab 04/08/18 1226  NA 131*  K 4.7  CL 95*  CO2 27  GLUCOSE 90  BUN 21*  CREATININE 1.01  CALCIUM 9.2  GFRNONAA >60  GFRAA >60  ANIONGAP 9    No results for input(s): PROT, ALBUMIN, AST, ALT, ALKPHOS,  BILITOT in the last 168 hours. HematologyNo results for input(s): WBC, RBC, HGB, HCT, MCV, MCH, MCHC, RDW, PLT in the last 168 hours. Cardiac EnzymesNo results for input(s): TROPONINI in the last 168 hours. No results for input(s): TROPIPOC in the last 168 hours.  BNPNo results for input(s): BNP, PROBNP in the last 168 hours.  DDimer No results for input(s): DDIMER in the last 168 hours.  Radiology/Studies:  No results found.  Assessment and Plan:   1. Paroxysmal AFib     CHA2DS2Vasc is 4, unable to a/c given GIB's     K+ 4.7     Mag 2.1     Creat 1.1 (Cal CrCl is 61)     Given RBBB, QRS duration of 129ms, QTc is acceptable to start  2. HTN     Continue home meds  3. NICM     No evidence of fluid OL  4. Hx of symptomatic AFib     On coreg, will discuss with Dr. [name], given trifascicular block may stop   For questions or updates, please contact [place] Please consult [email or web address] for contact info under Cardiology/STEMI.    Venetia Night, PA-C 04/08/2018 9:23 PM   EP Attending Patient seen and examined. Agree with the findings as noted above. The patient is well known to me from multiple clinic visits. He is a pleasant elderly man who has developed PAF, is unable to take systemic anti-coagulation due to GI bleeding, and also unable to take amiodarone. He has known and longstanding sinus node dysfunction and has been unable to tolerate much beta blockade. He has remained in NSR over the past few weeks. He returns today to initiate dofetilide. His exam is accurately  documented as above. His QT is a bit long but is compensated by QRS prolongation. His dose of dofetilide on border line between 500 and 250 q12. We will start 250 q12. Will follow electrolytes, renal function and QT intervals.  Mikle Bosworth.D.

## 2018-04-09 LAB — BASIC METABOLIC PANEL
Anion gap: 6 (ref 5–15)
BUN: 21 mg/dL — AB (ref 6–20)
CALCIUM: 8.8 mg/dL — AB (ref 8.9–10.3)
CHLORIDE: 100 mmol/L — AB (ref 101–111)
CO2: 27 mmol/L (ref 22–32)
CREATININE: 1.03 mg/dL (ref 0.61–1.24)
GFR calc Af Amer: >60 mL/min (ref >60)
Glucose, Bld: 96 mg/dL (ref 65–99)
Potassium: 3.9 mmol/L (ref 3.5–5.1)
SODIUM: 133 mmol/L — AB (ref 135–145)

## 2018-04-09 LAB — MAGNESIUM: MAGNESIUM: 1.9 mg/dL (ref 1.7–2.4)

## 2018-04-09 MED ORDER — POTASSIUM CHLORIDE CRYS ER 20 MEQ PO TBCR
20.0000 meq | EXTENDED_RELEASE_TABLET | Freq: Once | ORAL | Status: AC
  Administered 2018-04-09: 20 meq via ORAL
  Filled 2018-04-09: qty 1

## 2018-04-09 MED ORDER — GABAPENTIN 100 MG PO CAPS
200.0000 mg | ORAL_CAPSULE | Freq: Three times a day (TID) | ORAL | Status: DC
  Administered 2018-04-09 – 2018-04-11 (×7): 200 mg via ORAL
  Filled 2018-04-09 (×7): qty 2

## 2018-04-09 MED ORDER — MAGNESIUM SULFATE IN D5W 1-5 GM/100ML-% IV SOLN
1.0000 g | Freq: Once | INTRAVENOUS | Status: AC
  Administered 2018-04-09: 1 g via INTRAVENOUS
  Filled 2018-04-09: qty 100

## 2018-04-09 MED ORDER — CARVEDILOL 3.125 MG PO TABS
3.1250 mg | ORAL_TABLET | Freq: Every day | ORAL | Status: DC
  Administered 2018-04-10: 3.125 mg via ORAL
  Filled 2018-04-09: qty 1

## 2018-04-09 MED ORDER — ASPIRIN EC 325 MG PO TBEC
325.0000 mg | DELAYED_RELEASE_TABLET | Freq: Every day | ORAL | Status: DC | PRN
  Administered 2018-04-09: 325 mg via ORAL
  Filled 2018-04-09: qty 1

## 2018-04-09 NOTE — Care Management Note (Signed)
Case Management Note  Patient Details  Name: Brian Hoover MRN: [id number] Date of Birth: Sep 01, 1932  Subjective/Objective: Pt presented for Tikosyn Load. Benefits Check completed- pt uses [facility] [address] and medication is available. Pt will need a Rx for 7 day supply no refills and the additional Rx with refills. CM will assist with the 7 day supply via [place]. No further needs from CM at this time.                    Action/Plan: S/W [facility] @ [place] RX # [phone]   1. TIKOSYN 250 MCG BID  [place] # [phone]   2. DOFETILIDE 250 MCG BID  COVER- YES  CO-PAY- $95.00  TIER- 4 DRUG  PRIOR APPROVAL- NO   DEDUCTIBLE: NOT MET    Expected Discharge Date:                  Expected Discharge Plan:  Home/Self Care  In-House Referral:  NA  Discharge planning Services  CM Consult, Medication Assistance  Post Acute Care Choice:  NA Choice offered to:  NA  DME Arranged:  N/A DME Agency:  NA  HH Arranged:  NA HH Agency:  NA  Status of Service:  Completed, signed off  If discussed at Long Length of Stay Meetings, dates discussed:    Additional Comments:  Bethena Roys, RN 04/09/2018, 12:37 PM

## 2018-04-09 NOTE — Progress Notes (Addendum)
Progress Note  Patient Name: Brian Hoover Date of Encounter: 04/09/2018  Primary Cardiologist: Cristopher Peru, MD   Subjective   Tolerating drug, no complaints this AM  Inpatient Medications    Scheduled Meds: . aspirin EC  81 mg Oral Daily  . carvedilol  3.125 mg Oral Daily  . docusate sodium  100 mg Oral BID  . dofetilide  250 mcg Oral BID  . gabapentin  100 mg Oral TID  . lisinopril  40 mg Oral Daily  . sodium chloride flush  3 mL Intravenous Q12H  . valACYclovir  1,000 mg Oral Daily   Continuous Infusions: . sodium chloride    . magnesium sulfate 1 - 4 g bolus IVPB Stopped (04/09/18 0915)   PRN Meds: sodium chloride, bisacodyl, sodium chloride flush   Vital Signs    Vitals:   04/08/18 1647 04/08/18 2139 04/09/18 0540  BP: 123/74 (!) 158/74 (!) 149/71  Pulse: 60 (!) 53 (!) 50  Resp:  20 20  Temp: 98.3 F (36.8 C) 97.7 F (36.5 C) 97.8 F (36.6 C)  TempSrc: Oral Oral Oral  SpO2: 96% 98% 98%  Weight:   173 lb 1.6 oz (78.5 kg)   No intake or output data in the 24 hours ending 04/09/18 0905 Filed Weights   04/09/18 0540  Weight: 173 lb 1.6 oz (78.5 kg)    Telemetry    SR/SB, noted some accel junctional rhythm - Personally Reviewed  ECG    SB 52bpm, RBBB, 1st degree AVB, LAD, QTc stable- Personally Reviewed  Physical Exam   GEN: No acute distress.   Neck: No JVD Cardiac: RRR, no murmurs, rubs, or gallops.  Respiratory: Clear to auscultation bilaterally. GI: Soft, nontender, non-distended  MS: No edema; No deformity. Neuro:  Nonfocal  Psych: Normal affect   Labs    Chemistry Recent Labs  Lab 04/08/18 1226 04/09/18 0432  NA 131* 133*  K 4.7 3.9  CL 95* 100*  CO2 27 27  GLUCOSE 90 96  BUN 21* 21*  CREATININE 1.01 1.03  CALCIUM 9.2 8.8*  GFRNONAA >60 >60  GFRAA >60 >60  ANIONGAP 9 6     HematologyNo results for input(s): WBC, RBC, HGB, HCT, MCV, MCH, MCHC, RDW, PLT in the last 168 hours.  Cardiac EnzymesNo results for input(s):  TROPONINI in the last 168 hours. No results for input(s): TROPIPOC in the last 168 hours.   BNPNo results for input(s): BNP, PROBNP in the last 168 hours.   DDimer No results for input(s): DDIMER in the last 168 hours.   Radiology    No results found.  Cardiac Studies   09/16/17: TTE Study Conclusions - Left ventricle: The cavity size was normal. Wall thickness was increased in a pattern of moderate LVH. Systolic function was mildly to moderately reduced. The estimated ejection fraction was in the range of 40% to 45%. Inferior akinesis. The study is not technically sufficient to allow evaluation of LV diastolic function. - Aortic valve: Trileaflet. Sclerosis without stenosis. There was no regurgitation. - Mitral valve: Calcified annulus. Mildly thickened leaflets . There was trivial regurgitation. - Left atrium: The atrium was normal in size. - Tricuspid valve: There was trivial regurgitation. - Pulmonary arteries: PA peak pressure: 19 mm Hg (S). - Systemic veins: The IVC was not visualized. Impressions: - Compared to a prior echo in 06/2017, the LVEF is improved to 40-45% (from 35-40%) with inferior akinesis.  Patient Profile     84 y.o. male HTN, hx  of PVC induced CM s/p ablation, CHF, COPD, hx of symptomatic bradycardia that improved off/with reduced nodal blocking medicines, and AFib, admitted for Tikosyn initiation  AAD Hx Amiodarone developed dizziness, leg weakness felt to be 2/2 drug, stopped 12/31/17  Assessment & Plan    1. Paroxysmal AFib     CHA2DS2Vasc is 4, unable to a/c given GIB's     K+ 3.9 (replaced)     Mag 1.9 (replaced)     Creat 1.03 (stable)     EKG reviewed with Dr. [name], stable QTc  Given borderline renal function, dose was initiated at 27mcg Remains in SR  2. HTN     Continue home meds  3. NICM     No evidence of fluid OL  4. Baseline bradycardia, conduction system disease    telemetry reviewed with Dr. [name],  noted accelerated junctional, for now continue low dose daily coreg    For questions or updates, please contact [facility] Please consult [email or web address] for contact info under Cardiology/STEMI.      Signed, Baldwin Jamaica, PA-C  04/09/2018, 9:05 AM    EP attending  Patient seen and examined.  Agree with the findings as noted above.  The patient has received 1 dose of dofetilide, and his initial QT interval is acceptable.  He will continue his current medications.  He has clear-cut sinus node dysfunction and despite bradycardia in the 40s, he is asymptomatic.  We may have to discontinue his carvedilol.  We will plan to continue following the patient.  Continue dofetilide 250 mcg twice a day.  Cristopher Peru, MD

## 2018-04-09 NOTE — Progress Notes (Signed)
I spoke with pharmacy about patients Coreg. Patient states he takes Coreg daily at night. Medication was given this morning. Medication will be given tomorrow evening.

## 2018-04-10 ENCOUNTER — Encounter (HOSPITAL_COMMUNITY): Payer: Self-pay

## 2018-04-10 LAB — BASIC METABOLIC PANEL
Anion gap: 6 (ref 5–15)
BUN: 16 mg/dL (ref 6–20)
CHLORIDE: 99 mmol/L — AB (ref 101–111)
CO2: 27 mmol/L (ref 22–32)
CREATININE: 1 mg/dL (ref 0.61–1.24)
Calcium: 8.7 mg/dL — ABNORMAL LOW (ref 8.9–10.3)
GFR calc Af Amer: >60 mL/min (ref >60)
GFR calc non Af Amer: >60 mL/min (ref >60)
GLUCOSE: 87 mg/dL (ref 65–99)
POTASSIUM: 4.1 mmol/L (ref 3.5–5.1)
Sodium: 132 mmol/L — ABNORMAL LOW (ref 135–145)

## 2018-04-10 LAB — MAGNESIUM: MAGNESIUM: 2.1 mg/dL (ref 1.7–2.4)

## 2018-04-10 NOTE — Discharge Instructions (Signed)

## 2018-04-10 NOTE — Progress Notes (Addendum)
Progress Note  Patient Name: Brian Hoover Date of Encounter: 04/10/2018  Primary Cardiologist: Cristopher Peru, MD   Subjective   Tolerating drug, no complaints this AM, getting "bored"  Inpatient Medications    Scheduled Meds: . aspirin EC  81 mg Oral Daily  . carvedilol  3.125 mg Oral Daily  . docusate sodium  100 mg Oral BID  . dofetilide  250 mcg Oral BID  . gabapentin  200 mg Oral TID  . lisinopril  40 mg Oral Daily  . sodium chloride flush  3 mL Intravenous Q12H  . valACYclovir  1,000 mg Oral Daily   Continuous Infusions: . sodium chloride     PRN Meds: sodium chloride, aspirin EC, bisacodyl, sodium chloride flush   Vital Signs    Vitals:   04/09/18 1506 04/09/18 2021 04/10/18 0500 04/10/18 0803  BP: 130/60 121/61 129/60 121/65  Pulse: (!) 46 (!) 53 (!) 47   Resp:      Temp: 97.9 F (36.6 C) 97.7 F (36.5 C) 97.6 F (36.4 C)   TempSrc: Oral Oral Oral   SpO2: 100% 98% 100%   Weight:      Height: 5\' 10"  (1.778 m)       Intake/Output Summary (Last 24 hours) at 04/10/2018 0930 Last data filed at 04/09/2018 1505 Gross per 24 hour  Intake 240 ml  Output -  Net 240 ml   Filed Weights   04/09/18 0540  Weight: 173 lb 1.6 oz (78.5 kg)    Telemetry    SR/SB, noted some accel junctional rhythm - Personally Reviewed  ECG    SB 47bpm, RBBB, 1st degree AVB, LAD, QTc stable- Personally Reviewed  Physical Exam   Exam is unchanged GEN: No acute distress.   Neck: No JVD Cardiac: RRR, no murmurs, rubs, or gallops.  Respiratory: Clear to auscultation bilaterally. GI: Soft, nontender, non-distended  MS: No edema; No deformity. Neuro:  Nonfocal  Psych: Normal affect   Labs    Chemistry Recent Labs  Lab 04/08/18 1226 04/09/18 0432 04/10/18 0457  NA 131* 133* 132*  K 4.7 3.9 4.1  CL 95* 100* 99*  CO2 27 27 27   GLUCOSE 90 96 87  BUN 21* 21* 16  CREATININE 1.01 1.03 1.00  CALCIUM 9.2 8.8* 8.7*  GFRNONAA >60 >60 >60  GFRAA >60 >60 >60  ANIONGAP 9  6 6      HematologyNo results for input(s): WBC, RBC, HGB, HCT, MCV, MCH, MCHC, RDW, PLT in the last 168 hours.  Cardiac EnzymesNo results for input(s): TROPONINI in the last 168 hours. No results for input(s): TROPIPOC in the last 168 hours.   BNPNo results for input(s): BNP, PROBNP in the last 168 hours.   DDimer No results for input(s): DDIMER in the last 168 hours.   Radiology    No results found.  Cardiac Studies   09/16/17: TTE Study Conclusions - Left ventricle: The cavity size was normal. Wall thickness was increased in a pattern of moderate LVH. Systolic function was mildly to moderately reduced. The estimated ejection fraction was in the range of 40% to 45%. Inferior akinesis. The study is not technically sufficient to allow evaluation of LV diastolic function. - Aortic valve: Trileaflet. Sclerosis without stenosis. There was no regurgitation. - Mitral valve: Calcified annulus. Mildly thickened leaflets . There was trivial regurgitation. - Left atrium: The atrium was normal in size. - Tricuspid valve: There was trivial regurgitation. - Pulmonary arteries: PA peak pressure: 19 mm Hg (S). -  Systemic veins: The IVC was not visualized. Impressions: - Compared to a prior echo in 06/2017, the LVEF is improved to 40-45% (from 35-40%) with inferior akinesis.  Patient Profile     84 y.o. male HTN, hx of PVC induced CM s/p ablation, CHF, COPD, hx of symptomatic bradycardia that improved off/with reduced nodal blocking medicines, and AFib, admitted for Tikosyn initiation  AAD Hx Amiodarone developed dizziness, leg weakness felt to be 2/2 drug, stopped 12/31/17  Assessment & Plan    1. Paroxysmal AFib     CHA2DS2Vasc is 4, unable to a/c given GIB's     K+ 4.1     Mag 2.1     Creat 1.00 (stable)     EKG reviewed with Dr. [name], stable QTc  Given borderline renal function, dose was initiated at 281mcg Remains in [place] discharge  tomorrow  2. HTN     Continue home meds  3. NICM     No evidence of fluid OL  4. Baseline bradycardia, conduction system disease     for now continue low dose daily coreg    For questions or updates, please contact [place] Please consult [email or web address] for contact info under Cardiology/STEMI.      Signed, Baldwin Jamaica, PA-C  04/10/2018, 9:30 AM    EP attending  Patient seen and examined.  Agree with the findings as noted above.  He is doing well on dofetilide.  QT is acceptable.  He will continue the medicine at the current dose along with his other medications.  Expect to discharge the patient tomorrow.  Cristopher Peru, MD

## 2018-04-11 LAB — BASIC METABOLIC PANEL
Anion gap: 6 (ref 5–15)
BUN: 17 mg/dL (ref 6–20)
CHLORIDE: 100 mmol/L — AB (ref 101–111)
CO2: 27 mmol/L (ref 22–32)
Calcium: 9.2 mg/dL (ref 8.9–10.3)
Creatinine, Ser: 1.02 mg/dL (ref 0.61–1.24)
GFR calc Af Amer: >60 mL/min (ref >60)
GFR calc non Af Amer: >60 mL/min (ref >60)
GLUCOSE: 93 mg/dL (ref 65–99)
POTASSIUM: 5 mmol/L (ref 3.5–5.1)
SODIUM: 133 mmol/L — AB (ref 135–145)

## 2018-04-11 LAB — MAGNESIUM: Magnesium: 2.2 mg/dL (ref 1.7–2.4)

## 2018-04-11 MED ORDER — DOFETILIDE 250 MCG PO CAPS: 250.0000 ug | ORAL_CAPSULE | Freq: Two times a day (BID) | ORAL | 6 refills | Status: DC

## 2018-04-11 NOTE — Discharge Summary (Addendum)
ELECTROPHYSIOLOGY PROCEDURE DISCHARGE SUMMARY    Patient ID: Brian Hoover,  MRN: [id number], DOB/AGE: 82-Oct-1933 82 y.o.  Admit date: 04/08/2018 Discharge date: 04/11/2018  Primary Care Physician: [name], MD  Primary Cardiologist/Electrophysiologist: Dr. [name]  Primary Discharge Diagnosis:  1.  Paroxysmal atrial fibrillation status post Tikosyn loading this admission      CHA2DS2Vasc is 4, not on a/c 2/2 hx of GIB  Secondary Discharge Diagnosis:  1. HTN 2. Baseline conduction system disease     Asymptomatic  3. COPD  Allergies  Allergen Reactions  . Sulfa Antibiotics Hives          Procedures This Admission:  1.  Tikosyn loading   Brief HPI: Brian Hoover is a 84 y.o. male with a past medical history as noted above.  He is followed out patient by cardiology.  Risks, benefits, and alternatives to Tikosyn were reviewed with the patient who wished to proceed.    Hospital Course:  The patient was admitted and Tikosyn was initiated.  Renal function and electrolytes were followed during the hospitalization. His QTc remained stable.  He arrived in SR and maintained SB/SR throughout his stay.  On the day of discharge the patient feels well, he was examined by Dr [name] who considered the patient stable for discharge to home.  Follow-up has been arranged with the AFib clinic in 1 week and with Dr [name] in 4 weeks.   Physical Exam: Vitals:   04/10/18 1712 04/10/18 2027 04/11/18 0411 04/11/18 1342  BP: 120/60 (!) 134/55 126/62 127/67  Pulse: 65 79 (!) 50 (!) 52  Resp:      Temp:  98.2 F (36.8 C) 98 F (36.7 C) 97.9 F (36.6 C)  TempSrc:  Oral Oral Oral  SpO2:  97% 96% 100%  Weight:   172 lb (78 kg)   Height:        GEN- The patient is well appearing, alert and oriented x 3 today.   HEENT: normocephalic, atraumatic; sclera clear, conjunctiva pink; hearing intact; oropharynx clear; neck supple, no JVP Lymph- no cervical lymphadenopathy Lungs- CTA b/l,  normal work of breathing.  No wheezes, rales, rhonchi Heart- RRR, bradycardic, no murmurs, rubs or gallops, PMI not laterally displaced GI- soft, non-tender, non-distended Extremities- no clubbing, cyanosis, or edema MS- no significant deformity or atrophy Skin- warm and dry, no rash or lesion Psych- euthymic mood, full affect Neuro- strength and sensation are intact   Labs:   Lab Results  Component Value Date   WBC 8.3 01/02/2018   HGB 14.6 01/02/2018   HCT 42.3 01/02/2018   MCV 94 01/02/2018   PLT 379 01/02/2018    Recent Labs  Lab 04/11/18 0524  NA 133*  K 5.0  CL 100*  CO2 27  BUN 17  CREATININE 1.02  CALCIUM 9.2  GLUCOSE 93     Discharge Medications:  Allergies as of 04/11/2018      Reactions   Sulfa Antibiotics Hives         Medication List    TAKE these medications   acetaminophen 325 MG tablet Commonly known as:  TYLENOL Take 2 tablets (650 mg total) by mouth every 6 (six) hours as needed for mild pain (or Fever >/= 101).   aspirin EC 81 MG tablet Take 81 mg by mouth daily. What changed:  Another medication with the same name was removed. Continue taking this medication, and follow the directions you see here.   bisacodyl 5 MG EC  tablet Commonly known as:  DULCOLAX Take 5 mg by mouth daily.   carvedilol 3.125 MG tablet Commonly known as:  COREG Take 1 tablet (3.125 mg total) by mouth daily. What changed:  when to take this   dofetilide 250 MCG capsule Commonly known as:  TIKOSYN Take 1 capsule (250 mcg total) by mouth 2 (two) times daily.   gabapentin 100 MG capsule Commonly known as:  NEURONTIN Take 1 capsule (100 mg total) by mouth 3 (three) times daily. May increase to 2 caps (200 mg) TID in one week if needed; then 3 caps (300 mg) TID on week 3 if needed. What changed:    how much to take  additional instructions   ibuprofen 200 MG tablet Commonly known as:  ADVIL,MOTRIN Take 600 mg by mouth 2 (two) times daily as needed for  headache (pain).   lisinopril 40 MG tablet Commonly known as:  PRINIVIL,ZESTRIL Take 1 tablet (40 mg total) by mouth daily.   NUCYNTA 50 MG tablet Generic drug:  tapentadol Take 50 mg by mouth every 6 (six) hours as needed (back pain).   SENNA-PLUS 8.6-50 MG tablet Generic drug:  senna-docusate Take 2 tablets by mouth daily.   SYSTANE OP Place 1 drop into both eyes daily as needed (dry eyes).   valACYclovir 1000 MG tablet Commonly known as:  VALTREX Take 500 mg by mouth daily.       Disposition:   Home  Discharge Instructions    Diet - low sodium heart healthy   Complete by:  As directed    Increase activity slowly   Complete by:  As directed      Follow-up Information    [facility] Follow up on 05/14/2018.   Specialty:  Cardiology Why:  10:45AM Contact information: [address] [id number] mc [place]       [name], MD Follow up on 04/17/2018.   Specialty:  Cardiology Why:  9:30AM Contact information: [address] [address] [phone]           Duration of Discharge Encounter: Greater than 30 minutes including physician time.  Venetia Night, PA-C 04/11/2018 1:51 PM  EP Attending  Patient seen and examined. Agree with above. The patient is doing well on day 3 of dofetilide. His QT is acceptable and his sinus rate is in the low 50's. He will be discharged home with the usual followup.   Jadon Harbaugh,M.D.

## 2018-04-17 ENCOUNTER — Ambulatory Visit (HOSPITAL_COMMUNITY)
Admit: 2018-04-17 | Discharge: 2018-04-17 | Disposition: A | Discharge status: Home / Self Care | Payer: Medicare Other | Attending: Nurse Practitioner | Admitting: Nurse Practitioner

## 2018-04-17 ENCOUNTER — Encounter (HOSPITAL_COMMUNITY): Payer: Self-pay | Admitting: Nurse Practitioner

## 2018-04-17 VITALS — BP 126/82 | HR 54 | Ht 70.0 in | Wt 175.0 lb

## 2018-04-17 DIAGNOSIS — Z79899 Other long term (current) drug therapy: Secondary | ICD-10-CM | POA: Diagnosis not present

## 2018-04-17 DIAGNOSIS — I110 Hypertensive heart disease with heart failure: Secondary | ICD-10-CM | POA: Insufficient documentation

## 2018-04-17 DIAGNOSIS — Z882 Allergy status to sulfonamides status: Secondary | ICD-10-CM | POA: Insufficient documentation

## 2018-04-17 DIAGNOSIS — I481 Persistent atrial fibrillation: Secondary | ICD-10-CM | POA: Insufficient documentation

## 2018-04-17 DIAGNOSIS — Z808 Family history of malignant neoplasm of other organs or systems: Secondary | ICD-10-CM | POA: Diagnosis not present

## 2018-04-17 DIAGNOSIS — Z803 Family history of malignant neoplasm of breast: Secondary | ICD-10-CM | POA: Diagnosis not present

## 2018-04-17 DIAGNOSIS — Z791 Long term (current) use of non-steroidal anti-inflammatories (NSAID): Secondary | ICD-10-CM | POA: Insufficient documentation

## 2018-04-17 DIAGNOSIS — Z87891 Personal history of nicotine dependence: Secondary | ICD-10-CM | POA: Diagnosis not present

## 2018-04-17 DIAGNOSIS — Z7982 Long term (current) use of aspirin: Secondary | ICD-10-CM | POA: Diagnosis not present

## 2018-04-17 DIAGNOSIS — I5022 Chronic systolic (congestive) heart failure: Secondary | ICD-10-CM | POA: Diagnosis not present

## 2018-04-17 DIAGNOSIS — Z9889 Other specified postprocedural states: Secondary | ICD-10-CM | POA: Insufficient documentation

## 2018-04-17 DIAGNOSIS — I480 Paroxysmal atrial fibrillation: Secondary | ICD-10-CM | POA: Diagnosis not present

## 2018-04-17 DIAGNOSIS — J449 Chronic obstructive pulmonary disease, unspecified: Secondary | ICD-10-CM | POA: Insufficient documentation

## 2018-04-17 DIAGNOSIS — Z8261 Family history of arthritis: Secondary | ICD-10-CM | POA: Insufficient documentation

## 2018-04-17 DIAGNOSIS — K219 Gastro-esophageal reflux disease without esophagitis: Secondary | ICD-10-CM | POA: Insufficient documentation

## 2018-04-17 DIAGNOSIS — I452 Bifascicular block: Secondary | ICD-10-CM | POA: Insufficient documentation

## 2018-04-17 LAB — BASIC METABOLIC PANEL WITH GFR
Anion gap: 6 (ref 5–15)
BUN: 15 mg/dL (ref 6–20)
CO2: 31 mmol/L (ref 22–32)
Calcium: 9.9 mg/dL (ref 8.9–10.3)
Chloride: 98 mmol/L — ABNORMAL LOW (ref 101–111)
Creatinine, Ser: 1 mg/dL (ref 0.61–1.24)
GFR calc Af Amer: >60 mL/min
GFR calc non Af Amer: >60 mL/min
Glucose, Bld: 97 mg/dL (ref 65–99)
Potassium: 4.7 mmol/L (ref 3.5–5.1)
Sodium: 135 mmol/L (ref 135–145)

## 2018-04-17 LAB — MAGNESIUM: Magnesium: 2 mg/dL (ref 1.7–2.4)

## 2018-04-17 NOTE — Progress Notes (Signed)
Primary Care Physician: [name], MD Referring Physician:Dr. Devarius Nelles is a 84 y.o. male with a h/o afib,HTN, CHF, GI bleed not on anticoagulation. He had been on amiodarone, off with level of 0.3 for a while now. He is here to be admitted for Tikosyn. Since he is off anticoagulation for previous GI bleed, he could only be admitted for Tikosyn if he is in [place], per Dr. [name]. He is in SR today.  F/u in afib clinic, 5/15, he is in [place]. He feels no different on Tikosyn, was in SR going into hospital. He brings up zyprexa and nortriptyline, he used in the past. for cluster H/A's that he may use 3-4 times a year. I spoke tp PharmD, Zyprexa can be qtc prolonging and qtc is 455 ms today and he may be ok to take 1 tab at beginning of migraine as his routine, but warned there is potential to prolong qt. Nortriptyline  Could carry potential as well.  Today, he denies symptoms of palpitations, chest pain, shortness of breath, orthopnea, PND, lower extremity edema, dizziness, presyncope, syncope, or neurologic sequela. The patient is tolerating medications without difficulties and is otherwise without complaint today.   Past Medical History:  Diagnosis Date  . Atrial fibrillation ([place])   . Back pain   . Bradycardia   . CHF (congestive heart failure) ([place])   . Chronic systolic heart failure ([place])   . COPD (chronic obstructive pulmonary disease) ([place])   . GERD (gastroesophageal reflux disease)   . Hypertension   . MGUS (monoclonal gammopathy of unknown significance) 01/25/2015  . Migraine   . Pneumothorax 01/28/2013  . PVC (premature ventricular contraction)   . Syncope    s/p Medtronic ILR implant 05/2013 by Dr [name]  . Trifascicular block    Past Surgical History:  Procedure Laterality Date  . CHEST TUBE INSERTION Right 01/29/2013  . CLAVICLE SURGERY    . LOOP RECORDER IMPLANT  06/02/2013   Medtronic LinQ implanted for syncope by Dr [name]  . LOOP RECORDER IMPLANT N/A 06/02/2013    Procedure: LOOP RECORDER IMPLANT;  Surgeon: [name], MD;  [facility] LAB;  Service: Cardiovascular;  Laterality: N/A;  . PROSTATE SURGERY    . SHOULDER ARTHROSCOPY    . TENDON REPAIR     right foot    Current Outpatient Medications  Medication Sig Dispense Refill  . aspirin 325 MG EC tablet Take 325 mg by mouth every 6 (six) hours as needed for pain.    Marland Kitchen aspirin EC 81 MG tablet Take 81 mg by mouth daily.    . bisacodyl (DULCOLAX) 5 MG EC tablet Take 5 mg by mouth daily.     . carvedilol (COREG) 3.125 MG tablet Take 1 tablet (3.125 mg total) by mouth daily. (Patient taking differently: Take 3.125 mg by mouth at bedtime. ) 30 tablet 6  . dofetilide (TIKOSYN) 250 MCG capsule Take 1 capsule (250 mcg total) by mouth 2 (two) times daily. 60 capsule 6  . gabapentin (NEURONTIN) 100 MG capsule Take 1 capsule (100 mg total) by mouth 3 (three) times daily. May increase to 2 caps (200 mg) TID in one week if needed; then 3 caps (300 mg) TID on week 3 if needed. (Patient taking differently: Take 200 mg by mouth 3 (three) times daily. ) 270 capsule 3  . ibuprofen (ADVIL,MOTRIN) 200 MG tablet Take 600 mg by mouth 2 (two) times daily as needed for headache (pain).    Marland Kitchen  lisinopril (PRINIVIL,ZESTRIL) 40 MG tablet Take 1 tablet (40 mg total) by mouth daily. 90 tablet 3  . Polyethyl Glycol-Propyl Glycol (SYSTANE OP) Place 1 drop into both eyes daily as needed (dry eyes).    Marland Kitchen senna-docusate (SENNA-PLUS) 8.6-50 MG tablet Take 2 tablets by mouth daily.    . tapentadol (NUCYNTA) 50 MG tablet Take 50 mg by mouth every 6 (six) hours as needed (back pain).     . valACYclovir (VALTREX) 1000 MG tablet Take 500 mg by mouth daily.      No current facility-administered medications for this encounter.     Allergies  Allergen Reactions  . Sulfa Antibiotics Hives         Social History   Socioeconomic History  . Marital status: Married    Spouse name: [name]  . Number of children: 2  . Years of  education: MA  . Highest education level: Not on file  Occupational History  . Occupation: retired Glass blower/designer  . Financial resource strain: Not on file  . Food insecurity:    Worry: Not on file    Inability: Not on file  . Transportation needs:    Medical: Not on file    Non-medical: Not on file  Tobacco Use  . Smoking status: Former Smoker    Packs/day: 1.50    Years: 10.00    Pack years: 15.00    Types: Cigarettes    Last attempt to quit: 12/04/1960    Years since quitting: 57.4  . Smokeless tobacco: Never Used  Substance and Sexual Activity  . Alcohol use: Yes    Alcohol/week: 6.0 oz    Types: 10 Shots of liquor per week    Comment: 4 Drinks a week  . Drug use: No  . Sexual activity: Yes    Partners: Female    Birth control/protection: None  Lifestyle  . Physical activity:    Days per week: Not on file    Minutes per session: Not on file  . Stress: Not on file  Relationships  . Social connections:    Talks on phone: Not on file    Gets together: Not on file    Attends religious service: Not on file    Active member of club or organization: Not on file    Attends meetings of clubs or organizations: Not on file    Relationship status: Not on file  . Intimate partner violence:    Fear of current or ex partner: Not on file    Emotionally abused: Not on file    Physically abused: Not on file    Forced sexual activity: Not on file  Other Topics Concern  . Not on file  Social History Narrative      Pt lives at home with his spouse.   Caffeine Use: 2 cups daily.    Family History  Problem Relation Age of Onset  . Breast cancer Mother   . Uterine cancer Mother   . Rheum arthritis Son     ROS- All systems are reviewed and negative except as per the HPI above  Physical Exam: Vitals:   04/17/18 0931  BP: 126/82  Pulse: (!) 54  Weight: 175 lb (79.4 kg)  Height: 5\' 10"  (1.778 m)   Wt Readings from Last 3 Encounters:  04/17/18 175 lb (79.4 kg)    04/11/18 172 lb (78 kg)  04/08/18 176 lb (79.8 kg)    Labs: Lab Results  Component Value Date  NA 133 (L) 04/11/2018   K 5.0 04/11/2018   CL 100 (L) 04/11/2018   CO2 27 04/11/2018   GLUCOSE 93 04/11/2018   BUN 17 04/11/2018   CREATININE 1.02 04/11/2018   CALCIUM 9.2 04/11/2018   MG 2.2 04/11/2018   Lab Results  Component Value Date   INR 0.92 01/29/2013   Lab Results  Component Value Date   CHOL 172 09/16/2017   HDL 64 09/16/2017   LDLCALC 93 09/16/2017   TRIG 75 09/16/2017     GEN- The patient is well appearing, alert and oriented x 3 today.   Head- normocephalic, atraumatic Eyes-  Sclera clear, conjunctiva pink Ears- hearing intact Oropharynx- clear Neck- supple, no JVP Lymph- no cervical lymphadenopathy Lungs- Clear to ausculation bilaterally, normal work of breathing Heart- Regular rate and rhythm, no murmurs, rubs or gallops, PMI not laterally displaced GI- soft, NT, ND, + BS Extremities- no clubbing, cyanosis, or edema MS- no significant deformity or atrophy Skin- no rash or lesion Psych- euthymic mood, full affect Neuro- strength and sensation are intact  EKG-Sinus brady with first degree AV block at 240 ms, qrs int 136 ms, qtc 455 ms RBBB, LAFB,     Assessment and Plan: 1. Persistent afib General education re Phyllis Ginger use/ potential of combining other drugs that can prolong qtc.  Off anticoagulation 2/2 GI bleeds IN SR today  No benadryl, discussed warning with Zyprexa and nortriptyline, uses with cluster h/a's) and  nortriptyline and takes Zyprexa no more than one tab and with caution (takes one tab 2-4 times a year) Discuss further with Dr. [name] Bmet/mag today  F/u  with Dr. [name] 6/11 afib clinic as needed   Butch Penny C. Samirah Scarpati, [facility] [address] [place], [place] [address] [phone]

## 2018-05-14 ENCOUNTER — Ambulatory Visit: Payer: Medicare Other | Admitting: Internal Medicine

## 2018-05-14 ENCOUNTER — Encounter: Payer: Self-pay | Admitting: Internal Medicine

## 2018-05-14 VITALS — BP 118/68 | HR 52 | Ht 70.0 in | Wt 180.0 lb

## 2018-05-14 DIAGNOSIS — I5022 Chronic systolic (congestive) heart failure: Secondary | ICD-10-CM | POA: Diagnosis not present

## 2018-05-14 DIAGNOSIS — I480 Paroxysmal atrial fibrillation: Secondary | ICD-10-CM | POA: Diagnosis not present

## 2018-05-14 DIAGNOSIS — I453 Trifascicular block: Secondary | ICD-10-CM

## 2018-05-14 DIAGNOSIS — I493 Ventricular premature depolarization: Secondary | ICD-10-CM | POA: Diagnosis not present

## 2018-05-14 NOTE — Progress Notes (Addendum)
HPI Mr. Spitler returns today for followup of atrial fib. He also has sinus node dysfunction and frequent PVC's and mild LV dysfunction by echo. The patient was admitted several weeks ago and placed on dofetilide. He has been stable. He does have occaisional episodes of palpitations. He denies anginal symptoms. He has not fallen. He is not a candidate for an [place] due to bleeding. He is stable but still has some malaise. He has c/o HA and is taking nortriptyline.  Allergies  Allergen Reactions  . Sulfa Antibiotics Hives          Current Outpatient Medications  Medication Sig Dispense Refill  . aspirin 325 MG EC tablet Take 325 mg by mouth every 6 (six) hours as needed for pain.    Marland Kitchen aspirin EC 81 MG tablet Take 81 mg by mouth daily.    . bisacodyl (DULCOLAX) 5 MG EC tablet Take 5 mg by mouth daily.     . carvedilol (COREG) 3.125 MG tablet Take 1 tablet (3.125 mg total) by mouth daily. (Patient taking differently: Take 3.125 mg by mouth at bedtime. ) 30 tablet 6  . dofetilide (TIKOSYN) 250 MCG capsule Take 1 capsule (250 mcg total) by mouth 2 (two) times daily. 60 capsule 6  . gabapentin (NEURONTIN) 100 MG capsule Take 1 capsule (100 mg total) by mouth 3 (three) times daily. May increase to 2 caps (200 mg) TID in one week if needed; then 3 caps (300 mg) TID on week 3 if needed. (Patient taking differently: Take 200 mg by mouth 3 (three) times daily. ) 270 capsule 3  . ibuprofen (ADVIL,MOTRIN) 200 MG tablet Take 600 mg by mouth 2 (two) times daily as needed for headache (pain).    Marland Kitchen lisinopril (PRINIVIL,ZESTRIL) 40 MG tablet Take 1 tablet (40 mg total) by mouth daily. 90 tablet 3  . nortriptyline (PAMELOR) 25 MG capsule Take 25 mg by mouth at bedtime.    Vladimir Faster Glycol-Propyl Glycol (SYSTANE OP) Place 1 drop into both eyes daily as needed (dry eyes).    Marland Kitchen senna-docusate (SENNA-PLUS) 8.6-50 MG tablet Take 2 tablets by mouth daily.    . tapentadol (NUCYNTA) 50 MG tablet Take 50 mg by  mouth every 6 (six) hours as needed (back pain).     . valACYclovir (VALTREX) 1000 MG tablet Take 500 mg by mouth daily.      No current facility-administered medications for this visit.      Past Medical History:  Diagnosis Date  . Atrial fibrillation ([place])   . Back pain   . Bradycardia   . CHF (congestive heart failure) ([place])   . Chronic systolic heart failure ([place])   . COPD (chronic obstructive pulmonary disease) ([place])   . GERD (gastroesophageal reflux disease)   . Hypertension   . MGUS (monoclonal gammopathy of unknown significance) 01/25/2015  . Migraine   . Pneumothorax 01/28/2013  . PVC (premature ventricular contraction)   . Syncope    s/p Medtronic ILR implant 05/2013 by Dr [name]  . Trifascicular block     ROS:   All systems reviewed and negative except as noted in the HPI.   Past Surgical History:  Procedure Laterality Date  . CHEST TUBE INSERTION Right 01/29/2013  . CLAVICLE SURGERY    . LOOP RECORDER IMPLANT  06/02/2013   Medtronic LinQ implanted for syncope by Dr [name]  . LOOP RECORDER IMPLANT N/A 06/02/2013   Procedure: LOOP RECORDER IMPLANT;  Surgeon: [name], MD;  Location: [facility] CATH LAB;  Service: Cardiovascular;  Laterality: N/A;  . PROSTATE SURGERY    . SHOULDER ARTHROSCOPY    . TENDON REPAIR     right foot     Family History  Problem Relation Age of Onset  . Breast cancer Mother   . Uterine cancer Mother   . Rheum arthritis Son      Social History   Socioeconomic History  . Marital status: Married    Spouse name: [name]  . Number of children: 2  . Years of education: MA  . Highest education level: Not on file  Occupational History  . Occupation: retired Glass blower/designer  . Financial resource strain: Not on file  . Food insecurity:    Worry: Not on file    Inability: Not on file  . Transportation needs:    Medical: Not on file    Non-medical: Not on file  Tobacco Use  . Smoking status: Former Smoker    Packs/day: 1.50      Years: 10.00    Pack years: 15.00    Types: Cigarettes    Last attempt to quit: 12/04/1960    Years since quitting: 57.4  . Smokeless tobacco: Never Used  Substance and Sexual Activity  . Alcohol use: Yes    Alcohol/week: 6.0 oz    Types: 10 Shots of liquor per week    Comment: 4 Drinks a week  . Drug use: No  . Sexual activity: Yes    Partners: Female    Birth control/protection: None  Lifestyle  . Physical activity:    Days per week: Not on file    Minutes per session: Not on file  . Stress: Not on file  Relationships  . Social connections:    Talks on phone: Not on file    Gets together: Not on file    Attends religious service: Not on file    Active member of club or organization: Not on file    Attends meetings of clubs or organizations: Not on file    Relationship status: Not on file  . Intimate partner violence:    Fear of current or ex partner: Not on file    Emotionally abused: Not on file    Physically abused: Not on file    Forced sexual activity: Not on file  Other Topics Concern  . Not on file  Social History Narrative      Pt lives at home with his spouse.   Caffeine Use: 2 cups daily.     BP 118/68   Pulse (!) 52   Ht 5\' 10"  (1.778 m)   Wt 180 lb (81.6 kg)   SpO2 98%   BMI 25.83 kg/m   Physical Exam:  Well appearing 84 yo man,  NAD HEENT: Unremarkable Neck:  6 cm JVD, no thyromegally Lymphatics:  No adenopathy Back:  No CVA tenderness Lungs:  Clear with no wheezes HEART:  Regular rate rhythm, no murmurs, no rubs, no clicks Abd:  soft, positive bowel sounds, no organomegally, no rebound, no guarding Ext:  2 plus pulses, no edema, no cyanosis, no clubbing Skin:  No rashes no nodules Neuro:  CN II through XII intact, motor grossly intact   EKG - sinus bradycardia with PVC's  DEVICE  Normal device function.  See PaceArt for details.   Assess/Plan: 1. PAF - he appears to be maintaining NSR on dofetilide. He will continue. He cannot  take an [place]. I have asked him  to reduce the dose of the Nortriptyline to 50 mg nightly. He was taking 75 mg a day.  2. PVC's - he still has these but they are minimally symptomatic.  3. HTN - his blood pressure is usually well controlled. He will continue his current meds. If he develops symptoms of low blood pressure then we might stop the lisinopril.  4. GI bleeding - he has had none since stopping his [place]. We will follow.  Mikle Bosworth.D.

## 2018-05-14 NOTE — Patient Instructions (Addendum)
Medication Instructions:  Your physician has recommended you make the following change in your medication:  1.  Reduce your Nortriptyline 25 mg- Take 2 tablets or less daily.  Labwork: None ordered.  Testing/Procedures: None ordered.  Follow-Up: Your physician wants you to follow-up in: 3 months with Dr. [name].    Any Other Special Instructions Will Be Listed Below (If Applicable).  If you need a refill on your cardiac medications before your next appointment, please call your pharmacy.

## 2018-06-03 ENCOUNTER — Telehealth: Payer: Self-pay | Admitting: Cardiology

## 2018-06-03 NOTE — Telephone Encounter (Signed)
Spoke with Brian Hoover (on DPR) who reports pt has been having headaches recently, feels an irregular HR with rates between 42 bpm - 65 bpm and elevated BP as documented above. She states pt is staying well hydrated despite the extreme temps lately.  He is taking carvedilol 3.125 mg at bedtime only and Lisinopril 40 mg a day.  He has stopped his nortriptyline all together and is now having h/a.  They are asking 1) should his BP be treated further with the readings as they are 2) is there a different medication he can take to prevent h/a that doesn't have the potential to prolongate the Q-T.  He is also concerned because his irregular HR doesn't feel like his At Fib "normally" does. Advised I will forward this information to Dr [name] for review and recommendations.  Aware I will c/b once that has occurred.

## 2018-06-03 NOTE — Telephone Encounter (Signed)
New Message    Pt c/o BP issue:  1. What are your last 5 BP readings? 152/87, 160/87, 145/79, 150/78 2. Are you having any other symptoms (ex. Dizziness, headache, blurred vision, passed out)? Headaches especially in the mornings  3. What is your medication issue?

## 2018-06-03 NOTE — Telephone Encounter (Signed)
BP overall recently has been fairly well controlled, in fact low at times. I don't want to make any changes to coreg or lisinopril now. He was having HA with nortriptyline 75 and 50 it sounds like (read Dr. Tanna Furry note). I do not think HA is BP related. May need to try to change pillows. Early morning HA may be MSK related.  Candee Furbish, MD

## 2018-06-04 ENCOUNTER — Telehealth: Payer: Self-pay | Admitting: Neurology

## 2018-06-04 NOTE — Telephone Encounter (Signed)
Patient has some questions that he would like to speak to someone about his medication nortriptyline. He wants to know if it is interfering with his heart problems

## 2018-06-04 NOTE — Telephone Encounter (Signed)
Spoke with wife and reviewed recommendations by Dr [name].  They will contact the neurologist about medications to use for h/a that won't prolong Q-T seg.  They will c/b with further questions/concerns.

## 2018-06-04 NOTE — Telephone Encounter (Signed)
Called and spoke with Pt. He said his cardiologist advised him to not take more than 2 nortriptyline a day, as it increased his Q-T intervals. Pt has d/c and is thinking of restarting.  I advised him, per Dr Tomi Likens, he will need to take the advice of his cardiologist, and if restarts, he should have his cardiologist monitor with EKG. Pt states he has an appt with cardiologist next week, will discuss with them.

## 2018-06-10 ENCOUNTER — Other Ambulatory Visit: Payer: Self-pay | Admitting: Neurology

## 2018-06-17 ENCOUNTER — Ambulatory Visit: Payer: Medicare Other | Admitting: Neurology

## 2018-06-17 ENCOUNTER — Encounter: Payer: Self-pay | Admitting: Neurology

## 2018-06-17 VITALS — BP 138/82 | HR 54 | Ht 70.0 in | Wt 173.0 lb

## 2018-06-17 DIAGNOSIS — G44219 Episodic tension-type headache, not intractable: Secondary | ICD-10-CM

## 2018-06-17 DIAGNOSIS — G8929 Other chronic pain: Secondary | ICD-10-CM

## 2018-06-17 DIAGNOSIS — I619 Nontraumatic intracerebral hemorrhage, unspecified: Secondary | ICD-10-CM | POA: Diagnosis not present

## 2018-06-17 DIAGNOSIS — M545 Low back pain: Secondary | ICD-10-CM

## 2018-06-17 MED ORDER — GABAPENTIN 300 MG PO CAPS: 300.0000 mg | ORAL_CAPSULE | Freq: Three times a day (TID) | ORAL | 3 refills | Status: DC

## 2018-06-17 NOTE — Patient Instructions (Addendum)
1.  I increased gabapentin to 300mg  three times daily.  If headache and back pain not improved in 4 weeks, contact me and we can increase dose. 2.  Follow up in 5 months

## 2018-06-17 NOTE — Progress Notes (Signed)
NEUROLOGY FOLLOW UP OFFICE NOTE  Brian Hoover [id number]  HISTORY OF PRESENT ILLNESS: Brian Hoover is an 84 year old right-handed man with paroxysmal atrial fibrillation, cluster headaches, COPD, HSV II, CHF, GERD, chronic systolic heart failure, PVCs, and history of pheumothorax follows up for headache and dizziness.      UPDATE: Last visit, he endorsed feeling diffuse paresthesias.  B12 was 1,114.  TSH was elevated at 10.830 but free T4 was normal at 0.91.  Plan is to repeat labs at some future date.  He was started on gabapentin to help with the symptoms of back pain.  He is taking gabapentin 200mg  three times daily which helps but sees room for improvement.   HEMORRHAGIC STROKE/CRANIAL FOURTH NERVE PALSY: Update: Stable.     History: He developed vertical diplopia in 2017, particularly noticeable when he was driving.  He saw ophthalmologist, Dr. Jola Schmidt, who diagnosed a fourth nerve palsy.  Previously on Xarelto, which was discontinued due to dark stool and bleeding gums.   HEADACHE: Update: He would like to restart nortriptyline but his cardiologist told him he cannot take more than 50mg  as it had prolonged his Q-T interval.  He is now off of nortriptyline.  Headaches have been infrequent.   History: He has history of cluster headaches for over 20 years as well as other headaches for many years.   His cluster headaches are described as involving the right eye, 10/10 intensity, sharp stabbing pain, lasting one hour.  It would occur between 1:30 and 2 am in the morning.  He was previously treated with verapamil, which caused constipation.  He had used O2 and nasal sprays.  For many years, he has been taking Zyprexa.  He takes it as needed, only when he feels he has a cluster headache.  His last cluster headache was in 2012.   He also has other type of headache.  It is bi-frontal, non-throbbing ache, about 6-7/10.  It is not associated with other symptoms such as nausea or  photophobia.  It can last all day and typically occurs 4 times a month.  Often, he wakes up with it.  Sometimes, it would lead into a cluster, so he has taken Zyprexa recently for it, but it has not helped.  He was advised to start taking the Zyprexa daily.  He takes ASA or ibuprofen sparingly.  They only take the edge off.  Drinking two cups of coffee treats it well.  He drinks coffee daily.     DIZZINESS/PRESYNCOPE: Update: He had increased dizziness after increase in donepezil.  We discontinued it to see if symptoms improved.     History: He has had episodes of feeling "woozy" for several years.  One time it occurred after a 3 mile walk and another time it occurred when he stood up while walking in his garden.  He has not lost consciousness.  There is no spinning sensation but he says that the "room swims".  MRI of the brain and MRA of head from 11/05/12 were unremarkable.  MRA of neck showed no stenosis except for some smooth plaque at the origin of the right ICA.  Orthostatic testing was reportedly negative.     GAIT INSTABILITY AND CHRONIC BACK PAIN: Update: He sits at choir.  He believes his balance problems are due to the back pain.  He goes to PT.     History: He was also evaluated for balance issues.  This has been ongoing for several years.  He  denies numbness in the feet or weakness in the legs.  He tore his right achilles heel at that time and had right foot weakness, which improved.  NCV-EMG from 03/17/13 showed evidence of active and chronic right L5 radiculopathy but no evidence of polyneuropathy.  MRI of lumbar spine from 03/20/13 showed multi-level disc bulging and facet hypertrophy but no spinal stenosis or significant foraminal narrowing or cause for L5 radiculopathy.  Neuropathy labs were performed on 01/13/14, revealing small (0.3) M-spike. ANA, Sed Rate, RF, TSH, Hgb A1c, vitamin D 25-hydroxy, and B12 were unremarkable.   Repeat testing of SPEP/IFE from December 2015 again showed an  M-Spike with abnormal IgA lambda, consistent with MGUS.  He was referred to Dr. Alvy Bimler of Heme/Onc.  It was recommended to monitor for now. He continues to have non-radiating back pain with numbness in the legs with prolonged standing, unchanged.  A NCV-EMG performed on 01/14/15 showed chronic right L3-L5 radiculopathy but no evidence of generalized sensorimotor polyneuropathy.  To further evaluate gait instability, an MRI of the cervical spine was performed on 01/27/15, which did not reveal spinal stenosis.  MRI of lumbar spine is reportedly unchanged.  There is nothing surgical as per Dr. Vertell Limber.  He receives epidural injections by Dr. Maryjean Ka, which are only modestly effective.  Tramadol and Celebrex are ineffective.      PAST MEDICAL HISTORY: Past Medical History:  Diagnosis Date  . Atrial fibrillation ([place])   . Back pain   . Bradycardia   . CHF (congestive heart failure) ([place])   . Chronic systolic heart failure ([place])   . COPD (chronic obstructive pulmonary disease) ([place])   . GERD (gastroesophageal reflux disease)   . Hypertension   . MGUS (monoclonal gammopathy of unknown significance) 01/25/2015  . Migraine   . Pneumothorax 01/28/2013  . PVC (premature ventricular contraction)   . Syncope    s/p Medtronic ILR implant 05/2013 by Dr [name]  . Trifascicular block     MEDICATIONS: Current Outpatient Medications on File Prior to Visit  Medication Sig Dispense Refill  . aspirin 325 MG EC tablet Take 325 mg by mouth every 6 (six) hours as needed for pain.    Marland Kitchen aspirin EC 81 MG tablet Take 81 mg by mouth daily.    . bisacodyl (DULCOLAX) 5 MG EC tablet Take 5 mg by mouth daily.     . carvedilol (COREG) 3.125 MG tablet Take 1 tablet (3.125 mg total) by mouth daily. (Patient taking differently: Take 3.125 mg by mouth at bedtime. ) 30 tablet 6  . dofetilide (TIKOSYN) 250 MCG capsule Take 1 capsule (250 mcg total) by mouth 2 (two) times daily. 60 capsule 6  . ibuprofen (ADVIL,MOTRIN) 200 MG  tablet Take 600 mg by mouth 2 (two) times daily as needed for headache (pain).    Marland Kitchen lisinopril (PRINIVIL,ZESTRIL) 40 MG tablet Take 1 tablet (40 mg total) by mouth daily. 90 tablet 3  . nortriptyline (PAMELOR) 25 MG capsule Take 25 mg by mouth at bedtime.    Vladimir Faster Glycol-Propyl Glycol (SYSTANE OP) Place 1 drop into both eyes daily as needed (dry eyes).    Marland Kitchen senna-docusate (SENNA-PLUS) 8.6-50 MG tablet Take 2 tablets by mouth daily.    . tapentadol (NUCYNTA) 50 MG tablet Take 50 mg by mouth every 6 (six) hours as needed (back pain).     . valACYclovir (VALTREX) 1000 MG tablet Take 500 mg by mouth daily.      No current facility-administered medications on file prior  to visit.     ALLERGIES: Allergies  Allergen Reactions  . Sulfa Antibiotics Hives         FAMILY HISTORY: Family History  Problem Relation Age of Onset  . Breast cancer Mother   . Uterine cancer Mother   . Rheum arthritis Son     SOCIAL HISTORY: Social History   Socioeconomic History  . Marital status: Married    Spouse name: [name]  . Number of children: 2  . Years of education: MA  . Highest education level: Not on file  Occupational History  . Occupation: retired Glass blower/designer  . Financial resource strain: Not on file  . Food insecurity:    Worry: Not on file    Inability: Not on file  . Transportation needs:    Medical: Not on file    Non-medical: Not on file  Tobacco Use  . Smoking status: Former Smoker    Packs/day: 1.50    Years: 10.00    Pack years: 15.00    Types: Cigarettes    Last attempt to quit: 12/04/1960    Years since quitting: 57.5  . Smokeless tobacco: Never Used  Substance and Sexual Activity  . Alcohol use: Yes    Alcohol/week: 6.0 oz    Types: 10 Shots of liquor per week    Comment: 4 Drinks a week  . Drug use: No  . Sexual activity: Yes    Partners: Female    Birth control/protection: None  Lifestyle  . Physical activity:    Days per week: Not on file     Minutes per session: Not on file  . Stress: Not on file  Relationships  . Social connections:    Talks on phone: Not on file    Gets together: Not on file    Attends religious service: Not on file    Active member of club or organization: Not on file    Attends meetings of clubs or organizations: Not on file    Relationship status: Not on file  . Intimate partner violence:    Fear of current or ex partner: Not on file    Emotionally abused: Not on file    Physically abused: Not on file    Forced sexual activity: Not on file  Other Topics Concern  . Not on file  Social History Narrative      Pt lives at home with his spouse.   Caffeine Use: 2 cups daily.    REVIEW OF SYSTEMS: Constitutional: No fevers, chills, or sweats, no generalized fatigue, change in appetite Eyes: No visual changes, double vision, eye pain Ear, nose and throat: No hearing loss, ear pain, nasal congestion, sore throat Cardiovascular: No chest pain, palpitations Respiratory:  No shortness of breath at rest or with exertion, wheezes GastrointestinaI: No nausea, vomiting, diarrhea, abdominal pain, fecal incontinence Genitourinary:  No dysuria, urinary retention or frequency Musculoskeletal:  No neck pain, back pain Integumentary: No rash, pruritus, skin lesions Neurological: as above Psychiatric: No depression, insomnia, anxiety Endocrine: No palpitations, fatigue, diaphoresis, mood swings, change in appetite, change in weight, increased thirst Hematologic/Lymphatic:  No purpura, petechiae. Allergic/Immunologic: no itchy/runny eyes, nasal congestion, recent allergic reactions, rashes  PHYSICAL EXAM: Vitals:   06/17/18 1053  BP: 138/82  Pulse: (!) 54  SpO2: 97%   General: No acute distress.  Patient appears well-groomed.  normal body habitus. Head:  Normocephalic/atraumatic Eyes:  Fundi examined but not visualized Neck: supple, no paraspinal tenderness, full range of motion Heart:  Regular rate and  rhythm Lungs:  Clear to auscultation bilaterally Back: No paraspinal tenderness Neurological Exam: alert and oriented to person, place, and time. Attention span and concentration intact, recent and remote memory intact, fund of knowledge intact.  Speech fluent and not dysarthric, language intact.  CN II-XII intact. Bulk and tone normal, muscle strength 5/5 throughout.  Sensation to light touch  intact.  Deep tendon reflexes 2+ throughout.  Finger to nose testing intact.  Gait normal.  IMPRESSION: 1.  Remote hemorrhagic stroke causing fourth nerve palsy, likely secondary to hypertension, resolved 2.  Generalized paresthesias.  Description not consistent with a primary neuropathy/neuralgia.  More likely secondary to a medication (such as amiodarone) or anxiety. 3.  Paroxysmal atrial fibrillation.   4.  Episodic tension type headache, resolved  PLAN: 1.  To address back pain and headache, increase gabapentin to 300mg  three times daily.  He will contact me with update 2.  Follow up in 5 months.  25 minutes spent face to face with patient, over 50% spent discussing management.  Metta Clines, DO  CC:  [name], MD

## 2018-06-24 ENCOUNTER — Other Ambulatory Visit: Payer: Self-pay | Admitting: Neurology

## 2018-06-25 ENCOUNTER — Telehealth: Payer: Self-pay | Admitting: Neurology

## 2018-06-25 NOTE — Telephone Encounter (Signed)
He may stop gabapentin.  He may want to remain off of medication at this time and see if it is really needed.

## 2018-06-25 NOTE — Telephone Encounter (Signed)
Brian Hoover [phone]  Linna Hoff called and said that he does not want to continue to take the gabpentin 300 mg 3 times a day that it is making him feel jumpy and just over all a miserably feeling. Please call and advise.

## 2018-06-25 NOTE — Telephone Encounter (Signed)
Called and spoke with Pt. He does not not like the way he is feeling, the jumpy feeling, since increasing the gabapentin to 300 mg TID. I asked him if he felt better at 200 mg TID, he stated no, it still made him feel anxious and does not want to go back to that dose either.

## 2018-06-25 NOTE — Telephone Encounter (Signed)
Called and LMOVM for Pt to call me back

## 2018-06-26 NOTE — Telephone Encounter (Signed)
Pt called back, will stop gabapentin

## 2018-07-05 ENCOUNTER — Encounter: Payer: Self-pay | Admitting: Cardiology

## 2018-07-05 ENCOUNTER — Ambulatory Visit: Payer: Medicare Other | Admitting: Cardiology

## 2018-07-05 VITALS — BP 128/86 | HR 58 | Ht 70.0 in | Wt 173.2 lb

## 2018-07-05 DIAGNOSIS — I5022 Chronic systolic (congestive) heart failure: Secondary | ICD-10-CM

## 2018-07-05 DIAGNOSIS — I4891 Unspecified atrial fibrillation: Secondary | ICD-10-CM

## 2018-07-05 DIAGNOSIS — I493 Ventricular premature depolarization: Secondary | ICD-10-CM | POA: Diagnosis not present

## 2018-07-05 DIAGNOSIS — I480 Paroxysmal atrial fibrillation: Secondary | ICD-10-CM

## 2018-07-05 NOTE — Patient Instructions (Signed)

## 2018-07-05 NOTE — Progress Notes (Signed)
Cardiology Office Note:    Date:  07/05/2018   ID:  Brian Hoover, DOB [birth year], MRN [id number]  PCP:  [name], MD  Cardiologist:  Cristopher Peru, MD   Referring MD: [name], MD     History of Present Illness:    Brian Hoover is a 84 y.o. male  with history of paroxysmal atrial fibrillation now on dofetilide 04/2018 (previously amio but worsening paraesthesia), sinus bradycardia, PVCs, frequent falls and history of cerebrovascular bleeding (not candidate for [facility]).   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  Pacemaker may be warranted in the future.     Past Medical History:  Diagnosis Date  . Atrial fibrillation ([place])   . Back pain   . Bradycardia   . CHF (congestive heart failure) ([place])   . Chronic systolic heart failure ([place])   . COPD (chronic obstructive pulmonary disease) ([place])   . GERD (gastroesophageal reflux disease)   . Hypertension   . MGUS (monoclonal gammopathy of unknown significance) 01/25/2015  . Migraine   . Pneumothorax 01/28/2013  . PVC (premature ventricular contraction)   . Syncope    s/p Medtronic ILR implant 05/2013 by Dr [name]  . Trifascicular block     Past Surgical History:  Procedure Laterality Date  . CHEST TUBE INSERTION Right 01/29/2013  . CLAVICLE SURGERY    . LOOP RECORDER IMPLANT  06/02/2013   Medtronic LinQ implanted for syncope by Dr [name]  . LOOP RECORDER IMPLANT N/A 06/02/2013   Procedure: LOOP RECORDER IMPLANT;  Surgeon: [name], MD;  Location: [facility] CATH LAB;  Service: Cardiovascular;  Laterality: N/A;  . PROSTATE SURGERY    . SHOULDER ARTHROSCOPY    . TENDON REPAIR     right foot    Current Medications: Current Meds  Medication Sig  . aspirin 325 MG EC tablet Take 325 mg by mouth every 6 (six) hours as needed for pain.  Marland Kitchen aspirin EC 81 MG tablet Take 81 mg by mouth daily.  . bisacodyl (DULCOLAX) 5 MG EC tablet Take 5 mg by mouth daily.   . carvedilol (COREG) 3.125 MG  tablet Take 1 tablet (3.125 mg total) by mouth daily.  Marland Kitchen dofetilide (TIKOSYN) 250 MCG capsule Take 1 capsule (250 mcg total) by mouth 2 (two) times daily.  Marland Kitchen gabapentin (NEURONTIN) 100 MG capsule Take 200 mg by mouth 3 (three) times daily.  Marland Kitchen lisinopril (PRINIVIL,ZESTRIL) 40 MG tablet Take 1 tablet (40 mg total) by mouth daily.  Vladimir Faster Glycol-Propyl Glycol (SYSTANE OP) Place 1 drop into both eyes daily as needed (dry eyes).  Marland Kitchen senna-docusate (SENNA-PLUS) 8.6-50 MG tablet Take 2 tablets by mouth daily.  . tapentadol (NUCYNTA) 50 MG tablet Take 50 mg by mouth every 6 (six) hours as needed (back pain).   . valACYclovir (VALTREX) 1000 MG tablet Take 500 mg by mouth daily.      Allergies:   Sulfa antibiotics   Social History   Socioeconomic History  . Marital status: Married    Spouse name: Brian Hoover  . Number of children: 2  . Years of education: MA  . Highest education level: Not on file  Occupational History  . Occupation: retired Glass blower/designer  . Financial resource strain: Not on file  . Food insecurity:    Worry: Not on file    Inability: Not on file  . Transportation needs:    Medical: Not on file    Non-medical: Not on file  Tobacco Use  . Smoking status: Former Smoker    Packs/day: 1.50    Years: 10.00    Pack years: 15.00    Types: Cigarettes    Last attempt to quit: 12/04/1960    Years since quitting: 57.6  . Smokeless tobacco: Never Used  Substance and Sexual Activity  . Alcohol use: Yes    Alcohol/week: 6.0 oz    Types: 10 Shots of liquor per week    Comment: 4 Drinks a week  . Drug use: No  . Sexual activity: Yes    Partners: Female    Birth control/protection: None  Lifestyle  . Physical activity:    Days per week: Not on file    Minutes per session: Not on file  . Stress: Not on file  Relationships  . Social connections:    Talks on phone: Not on file    Gets together: Not on file    Attends religious service: Not on file    Active member of  club or organization: Not on file    Attends meetings of clubs or organizations: Not on file    Relationship status: Not on file  Other Topics Concern  . Not on file  Social History Narrative      Pt lives at home with his spouse.   Caffeine Use: 2 cups daily.     Family History: The patient's family history includes Breast cancer in his mother; Rheum arthritis in his son; Uterine cancer in his mother.  ROS:   Please see the history of present illness.     All others neg  EKGs/Labs/Other Studies Reviewed:    The following studies were reviewed today:  ECHO: 09/16/17 - Left ventricle: The cavity size was normal. Wall thickness was   increased in a pattern of moderate LVH. Systolic function was   mildly to moderately reduced. The estimated ejection fraction was   in the range of 40% to 45%. Inferior akinesis. The study is not   technically sufficient to allow evaluation of LV diastolic   function. - Aortic valve: Trileaflet. Sclerosis without stenosis. There was   no regurgitation. - Mitral valve: Calcified annulus. Mildly thickened leaflets .   There was trivial regurgitation. - Left atrium: The atrium was normal in size. - Tricuspid valve: There was trivial regurgitation. - Pulmonary arteries: PA peak pressure: 19 mm Hg (S). - Systemic veins: The IVC was not visualized.  Impressions:  - Compared to a prior echo in 06/2017, the LVEF is improved to   40-45% (from 35-40%) with inferior akinesis.  EKG:  None today  Recent Labs: 09/15/2017: B Natriuretic Peptide 427.4 01/02/2018: ALT 21; Hemoglobin 14.6; Platelets 379; TSH 10.830 04/17/2018: BUN 15; Creatinine, Ser 1.00; Magnesium 2.0; Potassium 4.7; Sodium 135  Recent Lipid Panel    Component Value Date/Time   CHOL 172 09/16/2017 0334   TRIG 75 09/16/2017 0334   HDL 64 09/16/2017 0334   CHOLHDL 2.7 09/16/2017 0334   VLDL 15 09/16/2017 0334   LDLCALC 93 09/16/2017 0334    Physical Exam:    VS:  BP 128/86    Pulse (!) 58   Ht 5\' 10"  (1.778 m)   Wt 173 lb 3.2 oz (78.6 kg)   SpO2 95%   BMI 24.85 kg/m     Wt Readings from Last 3 Encounters:  07/05/18 173 lb 3.2 oz (78.6 kg)  06/17/18 173 lb (78.5 kg)  05/14/18 180 lb (81.6 kg)     GEN: Well nourished,  well developed, in no acute distress  HEENT: normal  Neck: no JVD, carotid bruits, or masses Cardiac: RRR; no murmurs, rubs, or gallops,no edema  Respiratory:  clear to auscultation bilaterally, normal work of breathing GI: soft, nontender, nondistended, + BS MS: no deformity or atrophy  Skin: warm and dry, no rash Neuro:  Alert and Oriented x 3, Strength and sensation are intact Psych: euthymic mood, full affect   ASSESSMENT:    1. PVC's (premature ventricular contractions)   2. Paroxysmal atrial fibrillation (HCC)   3. Chronic systolic heart failure (HCC)    PLAN:    In order of problems listed above:  Paroxysmal atrial fibrillation -He is now on dofetilide.  Understands to avoid QT prolonging agents.  He is off of nortriptyline.  He was not felt to be an oral anticoagulation candidate based upon his prior hemorrhagic stroke however this was a very small region and his neurologist thought that this may be a reasonable thing for him to be on.  I will reach out and discussed with him.  Nonischemic cardiomyopathy -EF 40-45%.  Now the PVCs have improved hopefully this has improved as well.  Overall no change.  Paresthesias - He states that there is no real change with the gabapentin.  He thinks he would like to come off of this.  His wife asked about Valium.  I would be hesitant to use especially with his disequilibrium.  We will see him back in 6 months  Medication Adjustments/Labs and Tests Ordered: Current medicines are reviewed at length with the patient today.  Concerns regarding medicines are outlined above.  No orders of the defined types were placed in this encounter.  No orders of the defined types were placed in this  encounter.   Signed, Candee Furbish, MD  07/05/2018 1:49 PM    [facility]

## 2018-07-15 ENCOUNTER — Telehealth: Payer: Self-pay | Admitting: Cardiology

## 2018-07-15 NOTE — Telephone Encounter (Signed)
-----   Message from Pieter Partridge, DO sent at 07/05/2018  4:34 PM EDT ----- Regarding: RE: ? anticoagulation I agree.  I have no objection to start anticoagulation. Adam ----- Message ----- From: Jerline Pain, MD Sent: 07/05/2018   1:50 PM To: Pieter Partridge, DO Subject: ? anticoagulation                              Dr. Tomi Likens,  I wanted to ask your opinion on anticoagulation such as Eliquis on Mr. Mcenroe.  It sounds that previously he was felt not to be an anticoagulation candidate because of his prior hemorrhagic stroke but this was a fairly small location and it is been quite sometime since the stroke had occurred.  Would you be okay if I started him back on Eliquis?.  Thanks for your opinion.  Candee Furbish, MD

## 2018-07-15 NOTE — Telephone Encounter (Signed)
We discussed anticoagulation once again at his last visit.  After talking with Dr. Tomi Likens, I think it would be reasonable to once again restart to help reduce risk of thrombotic stroke from atrial fibrillation/flutter.  I would suggest we start Eliquis 5 mg twice daily.  His creatinine last check was 1.0, weight is 78.6 kg.  Stop aspirin.  Obviously if any bleeding issues occur, we would have low threshold for stopping anticoagulation once again.  Candee Furbish, MD

## 2018-07-16 MED ORDER — APIXABAN 5 MG PO TABS: 5.0000 mg | ORAL_TABLET | Freq: Two times a day (BID) | ORAL | 11 refills | Status: DC

## 2018-07-16 NOTE — Telephone Encounter (Signed)
Reviewed information with patient who is agreeable to start Eliquis.  Aware to d/c ASA.  Rx sent into pharmacy as requested.

## 2018-08-01 NOTE — Progress Notes (Signed)
NEUROLOGY FOLLOW UP OFFICE NOTE  Brian Hoover [id number]  HISTORY OF PRESENT ILLNESS: Brian Hoover is an 84 year old right-handed man with paroxysmal atrial fibrillation, cluster headaches, COPD, HSV II, CHF, GERD, chronic systolic heart failure, PVCs, and history of pheumothorax follows up to discuss medications.  UPDATE: In July, gabapentin 200mg  three times daily (prescribed for back pain) was increased to 300mg  three times daily to address headache as well.  A few days later it was discontinued because it caused anxiety and feeling "jumpy".  He decreased the dose to 400mg  daily and symptoms resolved.  He is back on 300mg  three times daily.  They side effects have not returned and it his helping the back pain but not the headaches.  They occur about twice a week. He takes ASA.  He has trouble sleeping at times and notes the headaches correlate when he has poor sleep.  He has been taking Advil PM which helps.  He took it last night and he feels great.  No headache today.  He wonders if it is okay to continue taking.Marland Kitchen  HEMORRHAGIC STROKE/CRANIAL FOURTH NERVE PALSY: Update: Stable.    History: He developed vertical diplopia in 2017, particularly noticeable when he was driving. He saw ophthalmologist, Dr. Jola Schmidt, who diagnosed a fourth nerve palsy. Previously on Xarelto, which was discontinued due to dark stool and bleeding gums.  HEADACHE: Update: See above.  History: He has history of cluster headaches for over 20 years as well as other headaches for many years.  His cluster headaches are described as involving the right eye, 10/10 intensity, sharp stabbing pain, lasting one hour. It would occur between 1:30 and 2 am in the morning. He was previously treated with verapamil, which caused constipation. He had used O2 and nasal sprays. For many years, he has been taking Zyprexa. He takes it as needed, only when he feels he has a cluster headache. His last cluster  headache was in 2012.  He also has other type of headache. It is bi-frontal, non-throbbing ache, about 6-7/10. It is not associated with other symptoms such as nausea or photophobia. It can last all day and typically occurs 4 times a month. Often, he wakes up with it. Sometimes, it would lead into a cluster, so he has taken Zyprexa recently for it, but it has not helped. He was advised to start taking the Zyprexa daily. He takes ASA or ibuprofen sparingly. They only take the edge off. Drinking two cups of coffee treats it well. He drinks coffee daily.   Nortriptyline was effective but his cardiologist told him he cannot take more than 50mg  as it had prolonged his Q-T interval.   DIZZINESS/PRESYNCOPE: Update: He had increased dizziness after increase in donepezil.  We discontinued it to see if symptoms improved.    History: He has had episodes of feeling "woozy" for several years. One time it occurred after a 3 mile walk and another time it occurred when he stood up while walking in his garden. He has not lost consciousness. There is no spinning sensation but he says that the "room swims". MRI of the brain and MRA of head from 11/05/12 were unremarkable. MRA of neck showed no stenosis except for some smooth plaque at the origin of the right ICA. Orthostatic testing was reportedly negative.   GAIT INSTABILITY AND CHRONIC BACK PAIN: Update: He sits at choir.  He believes his balance problems are due to the back pain.  He goes to PT.  History: He was also evaluated for balance issues. This has been ongoing for several years. He denies numbness in the feet or weakness in the legs. He tore his right achilles heel at that time and had right foot weakness, which improved. NCV-EMG from 03/17/13 showed evidence of active and chronic right L5 radiculopathy but no evidence of polyneuropathy. MRI of lumbar spine from 03/20/13 showed multi-level disc bulging and facet hypertrophy but  no spinal stenosis or significant foraminal narrowing or cause for L5 radiculopathy. Neuropathy labs were performed on 01/13/14, revealing small (0.3) M-spike. ANA, Sed Rate, RF, TSH, Hgb A1c, vitamin D 25-hydroxy, and B12 were unremarkable.  Repeat testing of SPEP/IFE from December 2015 again showed an M-Spike with abnormal IgA lambda, consistent with MGUS. He was referred to Dr. Alvy Bimler of Heme/Onc. It was recommended to monitor for now. He continues to have non-radiating back pain with numbness in the legs with prolonged standing, unchanged.  A NCV-EMG performed on 01/14/15 showed chronic right L3-L5 radiculopathy but no evidence of generalized sensorimotor polyneuropathy. To further evaluate gait instability, an MRI of the cervical spine was performed on 01/27/15, which did not reveal spinal stenosis.  MRI of lumbar spine is reportedly unchanged.  There is nothing surgical as per Dr. Vertell Limber.  He receives epidural injections by Dr. Maryjean Ka, which are only modestly effective. Gabapentin helpful.  Tramadol and Celebrex are ineffective.    PAST MEDICAL HISTORY: Past Medical History:  Diagnosis Date  . Atrial fibrillation ([place])   . Back pain   . Bradycardia   . CHF (congestive heart failure) ([place])   . Chronic systolic heart failure ([place])   . COPD (chronic obstructive pulmonary disease) ([place])   . GERD (gastroesophageal reflux disease)   . Hypertension   . MGUS (monoclonal gammopathy of unknown significance) 01/25/2015  . Migraine   . Pneumothorax 01/28/2013  . PVC (premature ventricular contraction)   . Syncope    s/p Medtronic ILR implant 05/2013 by Dr [name]  . Trifascicular block     MEDICATIONS: Current Outpatient Medications on File Prior to Visit  Medication Sig Dispense Refill  . apixaban (ELIQUIS) 5 MG TABS tablet Take 1 tablet (5 mg total) by mouth 2 (two) times daily. 60 tablet 11  . bisacodyl (DULCOLAX) 5 MG EC tablet Take 5 mg by mouth daily.     . carvedilol (COREG) 3.125 MG  tablet Take 1 tablet (3.125 mg total) by mouth daily. 30 tablet 6  . dofetilide (TIKOSYN) 250 MCG capsule Take 1 capsule (250 mcg total) by mouth 2 (two) times daily. 60 capsule 6  . gabapentin (NEURONTIN) 100 MG capsule Take 200 mg by mouth 3 (three) times daily.    Marland Kitchen lisinopril (PRINIVIL,ZESTRIL) 40 MG tablet Take 1 tablet (40 mg total) by mouth daily. 90 tablet 3  . Polyethyl Glycol-Propyl Glycol (SYSTANE OP) Place 1 drop into both eyes daily as needed (dry eyes).    Marland Kitchen senna-docusate (SENNA-PLUS) 8.6-50 MG tablet Take 2 tablets by mouth daily.    . tapentadol (NUCYNTA) 50 MG tablet Take 50 mg by mouth every 6 (six) hours as needed (back pain).     . valACYclovir (VALTREX) 1000 MG tablet Take 500 mg by mouth daily.      No current facility-administered medications on file prior to visit.     ALLERGIES: Allergies  Allergen Reactions  . Sulfa Antibiotics Hives         FAMILY HISTORY: Family History  Problem Relation Age of Onset  . Breast cancer  Mother   . Uterine cancer Mother   . Rheum arthritis Son     SOCIAL HISTORY: Social History   Socioeconomic History  . Marital status: Married    Spouse name: [name]  . Number of children: 2  . Years of education: MA  . Highest education level: Not on file  Occupational History  . Occupation: retired Glass blower/designer  . Financial resource strain: Not on file  . Food insecurity:    Worry: Not on file    Inability: Not on file  . Transportation needs:    Medical: Not on file    Non-medical: Not on file  Tobacco Use  . Smoking status: Former Smoker    Packs/day: 1.50    Years: 10.00    Pack years: 15.00    Types: Cigarettes    Last attempt to quit: 12/04/1960    Years since quitting: 57.6  . Smokeless tobacco: Never Used  Substance and Sexual Activity  . Alcohol use: Yes    Alcohol/week: 10.0 standard drinks    Types: 10 Shots of liquor per week    Comment: 4 Drinks a week  . Drug use: No  . Sexual activity: Yes      Partners: Female    Birth control/protection: None  Lifestyle  . Physical activity:    Days per week: Not on file    Minutes per session: Not on file  . Stress: Not on file  Relationships  . Social connections:    Talks on phone: Not on file    Gets together: Not on file    Attends religious service: Not on file    Active member of club or organization: Not on file    Attends meetings of clubs or organizations: Not on file    Relationship status: Not on file  . Intimate partner violence:    Fear of current or ex partner: Not on file    Emotionally abused: Not on file    Physically abused: Not on file    Forced sexual activity: Not on file  Other Topics Concern  . Not on file  Social History Narrative      Pt lives at home with his spouse.   Caffeine Use: 2 cups daily.    REVIEW OF SYSTEMS: Constitutional: No fevers, chills, or sweats, no generalized fatigue, change in appetite Eyes: No visual changes, double vision, eye pain Ear, nose and throat: No hearing loss, ear pain, nasal congestion, sore throat Cardiovascular: No chest pain, palpitations Respiratory:  No shortness of breath at rest or with exertion, wheezes GastrointestinaI: No nausea, vomiting, diarrhea, abdominal pain, fecal incontinence Genitourinary:  No dysuria, urinary retention or frequency Musculoskeletal:  No neck pain, back pain Integumentary: No rash, pruritus, skin lesions Neurological: as above Psychiatric: No depression, insomnia, anxiety Endocrine: No palpitations, fatigue, diaphoresis, mood swings, change in appetite, change in weight, increased thirst Hematologic/Lymphatic:  No purpura, petechiae. Allergic/Immunologic: no itchy/runny eyes, nasal congestion, recent allergic reactions, rashes  PHYSICAL EXAM: Blood pressure 116/78, pulse 63, height 5\' 10"  (1.778 m), weight 170 lb (77.1 kg), SpO2 97 %. General: No acute distress.  Patient appears well-groomed.  IMPRESSION: 1.  Episodic tension  type headaches, not intractable 2.  Remote hemorrhagic stroke causing fourth nerve palsy, likely secondary to hypertension, resolved  To try to limit use of NSAIDs, he may try Benadryl without Advil if possible.  With NSAIDs, we discussed risk of rebound headache, hypertension and the fact that he is on anticoagulation.  Ultimately, if headaches persist, may need to consider starting topiramate.  He will follow up in about 4 months.  17 minutes spent face to face with patient, 100% spent discussing management.  Metta Clines, DO  CC:  [name], MD

## 2018-08-02 ENCOUNTER — Encounter: Payer: Self-pay | Admitting: Neurology

## 2018-08-02 ENCOUNTER — Ambulatory Visit: Payer: Medicare Other | Admitting: Neurology

## 2018-08-02 ENCOUNTER — Encounter

## 2018-08-02 VITALS — BP 116/78 | HR 63 | Ht 70.0 in | Wt 170.0 lb

## 2018-08-02 DIAGNOSIS — G4700 Insomnia, unspecified: Secondary | ICD-10-CM

## 2018-08-02 DIAGNOSIS — G44219 Episodic tension-type headache, not intractable: Secondary | ICD-10-CM | POA: Diagnosis not present

## 2018-08-02 NOTE — Patient Instructions (Addendum)
1.  Try taking Benadryl alone to help sleep.  If that is not effective, you may take Advil PM 2.  If needed, we can start topamax 3.  Follow up in 4 months.

## 2018-08-14 ENCOUNTER — Telehealth: Payer: Self-pay | Admitting: Neurology

## 2018-08-14 NOTE — Telephone Encounter (Signed)
Patient called and left a voicemail stating that he needed a refill on a prescription. Please call him back at [phone]. Thanks!

## 2018-08-14 NOTE — Telephone Encounter (Signed)
Called and spoke with Pt. He said it was discussed at his last OV about increasing gabapentin to 300 mg QID. He previously thought he was having a reaction to gabapentin and had reduced the mg, however, the reaction was not due to gabapentin and he returned to 300 TID. He wishes to add another 300 mg between lunch and bedtime. He would also like to have 300 mg capsules so he does not have to take 12 pills a day.

## 2018-08-15 MED ORDER — GABAPENTIN 300 MG PO CAPS: 300.0000 mg | ORAL_CAPSULE | Freq: Four times a day (QID) | ORAL | 5 refills | Status: DC

## 2018-08-15 NOTE — Telephone Encounter (Signed)
That would be okay

## 2018-08-15 NOTE — Telephone Encounter (Signed)
Called and LMOVM advisng Pt 300 mg gabapentin Rx sent in, take 1 QID

## 2018-08-16 ENCOUNTER — Ambulatory Visit: Payer: Medicare Other | Admitting: Internal Medicine

## 2018-08-16 ENCOUNTER — Encounter: Payer: Self-pay | Admitting: Internal Medicine

## 2018-08-16 VITALS — BP 124/70 | HR 50 | Ht 70.0 in | Wt 171.2 lb

## 2018-08-16 DIAGNOSIS — I493 Ventricular premature depolarization: Secondary | ICD-10-CM | POA: Diagnosis not present

## 2018-08-16 DIAGNOSIS — I453 Trifascicular block: Secondary | ICD-10-CM | POA: Diagnosis not present

## 2018-08-16 DIAGNOSIS — I480 Paroxysmal atrial fibrillation: Secondary | ICD-10-CM

## 2018-08-16 DIAGNOSIS — I5022 Chronic systolic (congestive) heart failure: Secondary | ICD-10-CM

## 2018-08-16 NOTE — Patient Instructions (Addendum)
Medication Instructions:  Your physician recommends that you continue on your current medications as directed. Please refer to the Current Medication list given to you today.  Labwork: None ordered.  Testing/Procedures: None ordered.  Follow-Up: Your physician wants you to follow-up in: 4 months with Dr. Taylor. You will receive a reminder letter in the mail two months in advance. If you don't receive a letter, please call our office to schedule the follow-up appointment.   Any Other Special Instructions Will Be Listed Below (If Applicable).  If you need a refill on your cardiac medications before your next appointment, please call your pharmacy.   

## 2018-08-16 NOTE — Progress Notes (Signed)
HPI Brian Hoover returns today for followup of atrial fib. He is an 84 yo man who also has sinus node dysfunction and frequent PVC's and mild LV dysfunction by echo. The patient was admitted several weeks ago and placed on dofetilide. He has been stable. He does have occaisional episodes of palpitations. He denies anginal symptoms. He has not fallen. He was not thought to be a candidate for an [place] due to bleeding. He has not had any bleeding in the interim. He has seen Dr. [name] who appropriately is concerned about his stroke risk and has discussed with Dr. Tomi Likens. He is pending a re-initiation of Eliquis. He likes to take ASA for HA's.  Allergies  Allergen Reactions  . Sulfa Antibiotics Hives          Current Outpatient Medications  Medication Sig Dispense Refill  . apixaban (ELIQUIS) 5 MG TABS tablet Take 1 tablet (5 mg total) by mouth 2 (two) times daily. 60 tablet 11  . aspirin 325 MG tablet Take 975 mg by mouth as needed. For headaches    . bisacodyl (DULCOLAX) 5 MG EC tablet Take 5 mg by mouth daily.     . carvedilol (COREG) 3.125 MG tablet Take 1 tablet (3.125 mg total) by mouth daily. 30 tablet 6  . dofetilide (TIKOSYN) 250 MCG capsule Take 1 capsule (250 mcg total) by mouth 2 (two) times daily. 60 capsule 6  . gabapentin (NEURONTIN) 300 MG capsule Take 1 capsule (300 mg total) by mouth 4 (four) times daily. 120 capsule 5  . lisinopril (PRINIVIL,ZESTRIL) 40 MG tablet Take 1 tablet (40 mg total) by mouth daily. 90 tablet 3  . Polyethyl Glycol-Propyl Glycol (SYSTANE OP) Place 1 drop into both eyes daily as needed (dry eyes).    Marland Kitchen senna-docusate (SENNA-PLUS) 8.6-50 MG tablet Take 2 tablets by mouth daily.    . sildenafil (VIAGRA) 100 MG tablet Take 100 mg by mouth See admin instructions.  1  . tapentadol (NUCYNTA) 50 MG tablet Take 50 mg by mouth every 6 (six) hours as needed (back pain).     . valACYclovir (VALTREX) 1000 MG tablet Take 500 mg by mouth daily.      No current  facility-administered medications for this visit.      Past Medical History:  Diagnosis Date  . Atrial fibrillation ([place])   . Back pain   . Bradycardia   . CHF (congestive heart failure) ([place])   . Chronic systolic heart failure ([place])   . COPD (chronic obstructive pulmonary disease) ([place])   . GERD (gastroesophageal reflux disease)   . Hypertension   . MGUS (monoclonal gammopathy of unknown significance) 01/25/2015  . Migraine   . Pneumothorax 01/28/2013  . PVC (premature ventricular contraction)   . Syncope    s/p Medtronic ILR implant 05/2013 by Dr [name]  . Trifascicular block     ROS:   All systems reviewed and negative except as noted in the HPI.   Past Surgical History:  Procedure Laterality Date  . CHEST TUBE INSERTION Right 01/29/2013  . CLAVICLE SURGERY    . LOOP RECORDER IMPLANT  06/02/2013   Medtronic LinQ implanted for syncope by Dr [name]  . LOOP RECORDER IMPLANT N/A 06/02/2013   Procedure: LOOP RECORDER IMPLANT;  Surgeon: [name], MD;  Location: [facility] CATH LAB;  Service: Cardiovascular;  Laterality: N/A;  . PROSTATE SURGERY    . SHOULDER ARTHROSCOPY    . TENDON REPAIR     right  foot     Family History  Problem Relation Age of Onset  . Breast cancer Mother   . Uterine cancer Mother   . Rheum arthritis Son      Social History   Socioeconomic History  . Marital status: Married    Spouse name: [name]  . Number of children: 2  . Years of education: MA  . Highest education level: Not on file  Occupational History  . Occupation: retired Glass blower/designer  . Financial resource strain: Not on file  . Food insecurity:    Worry: Not on file    Inability: Not on file  . Transportation needs:    Medical: Not on file    Non-medical: Not on file  Tobacco Use  . Smoking status: Former Smoker    Packs/day: 1.50    Years: 10.00    Pack years: 15.00    Types: Cigarettes    Last attempt to quit: 12/04/1960    Years since quitting: 57.7  . Smokeless  tobacco: Never Used  Substance and Sexual Activity  . Alcohol use: Yes    Alcohol/week: 10.0 standard drinks    Types: 10 Shots of liquor per week    Comment: 4 Drinks a week  . Drug use: No  . Sexual activity: Yes    Partners: Female    Birth control/protection: None  Lifestyle  . Physical activity:    Days per week: Not on file    Minutes per session: Not on file  . Stress: Not on file  Relationships  . Social connections:    Talks on phone: Not on file    Gets together: Not on file    Attends religious service: Not on file    Active member of club or organization: Not on file    Attends meetings of clubs or organizations: Not on file    Relationship status: Not on file  . Intimate partner violence:    Fear of current or ex partner: Not on file    Emotionally abused: Not on file    Physically abused: Not on file    Forced sexual activity: Not on file  Other Topics Concern  . Not on file  Social History Narrative      Pt lives at home with his spouse.   Caffeine Use: 2 cups daily.     BP 124/70   Pulse (!) 50   Ht 5\' 10"  (1.778 m)   Wt 171 lb 3.2 oz (77.7 kg)   SpO2 97%   BMI 24.56 kg/m   Physical Exam:  Well appearing NAD HEENT: Unremarkable Neck:  No JVD, no thyromegally Lymphatics:  No adenopathy Back:  No CVA tenderness Lungs:  Clear with no wheezes HEART:  Regular rate rhythm, no murmurs, no rubs, no clicks Abd:  soft, positive bowel sounds, no organomegally, no rebound, no guarding Ext:  2 plus pulses, no edema, no cyanosis, no clubbing Skin:  No rashes no nodules Neuro:  CN II through XII intact, motor grossly intact  EKG - sinus bradycardia with RBBB, left axis and first degree AV block.  Assess/Plan: 1. PAF - he is maintaining NSR on dofetilide. Continue. 2. Sinus bradycardia - he is asymptomatic for now. 3. Syncope - he has not had any episodes. We will follow.  4. Falls - he has not had any falls since his he started back exercising.    Mikle Bosworth.D.

## 2018-08-19 ENCOUNTER — Telehealth: Payer: Self-pay | Admitting: Internal Medicine

## 2018-08-19 MED ORDER — DOFETILIDE 250 MCG PO CAPS: 250.0000 ug | ORAL_CAPSULE | Freq: Two times a day (BID) | ORAL | 3 refills | Status: DC

## 2018-08-19 NOTE — Telephone Encounter (Signed)
New Message   Do not call in until you speak with patient.          *STAT* If patient is at the pharmacy, call can be transferred to refill team.   1. Which medications need to be refilled? (please list name of each medication and dose if known) Dofetilide  2. Which pharmacy/location (including street and city if local pharmacy) is medication to be sent to? [facility] [place]  3. Do they need a 30 day or 90 day supply? 90  Patient has questions before this Rx is sent in.

## 2018-08-19 NOTE — Telephone Encounter (Signed)
Returned call to Pt.  Pt would like medication changed to 90 days.  Will reorder as requested.

## 2018-08-20 ENCOUNTER — Ambulatory Visit: Payer: Medicare Other | Admitting: Neurology

## 2018-09-13 ENCOUNTER — Other Ambulatory Visit: Payer: Self-pay | Admitting: Neurology

## 2018-12-16 ENCOUNTER — Encounter: Payer: Self-pay | Admitting: Internal Medicine

## 2018-12-16 ENCOUNTER — Ambulatory Visit: Payer: Medicare Other | Admitting: Internal Medicine

## 2018-12-16 VITALS — BP 122/70 | HR 52 | Ht 70.0 in | Wt 173.2 lb

## 2018-12-16 DIAGNOSIS — I480 Paroxysmal atrial fibrillation: Secondary | ICD-10-CM

## 2018-12-16 DIAGNOSIS — R55 Syncope and collapse: Secondary | ICD-10-CM

## 2018-12-16 NOTE — Progress Notes (Signed)
HPI Brian Hoover returns today for followup of atrial fib. He is an 85 yo man who also has sinus node dysfunction and frequent PVC's and mild LV dysfunction by echo. The patient was admitted several weeks ago and placed on dofetilide. He has been stable. He does have occaisional episodes of palpitations. He denies anginal symptoms. He has not fallen. He was not thought to be a candidate for an [place] due to bleeding. He has not had any bleeding in the interim. He thinks his heart has gone out of rhythm once since I saw him last.  Allergies  Allergen Reactions  . Sulfa Antibiotics Hives          Current Outpatient Medications  Medication Sig Dispense Refill  . aspirin 325 MG tablet Take 975 mg by mouth as needed. For headaches    . carvedilol (COREG) 3.125 MG tablet Take 1 tablet (3.125 mg total) by mouth daily. 30 tablet 6  . dofetilide (TIKOSYN) 250 MCG capsule Take 1 capsule (250 mcg total) by mouth 2 (two) times daily. 180 capsule 3  . gabapentin (NEURONTIN) 300 MG capsule TAKE 1 CAPSULE (300 MG TOTAL) BY MOUTH 4 (FOUR) TIMES DAILY. 360 capsule 2  . lisinopril (PRINIVIL,ZESTRIL) 40 MG tablet Take 1 tablet (40 mg total) by mouth daily. 90 tablet 3  . omeprazole (PRILOSEC) 40 MG capsule Take 40 mg by mouth daily.    . sildenafil (VIAGRA) 100 MG tablet Take 100 mg by mouth See admin instructions.  1  . tapentadol (NUCYNTA) 50 MG tablet Take 50 mg by mouth every 6 (six) hours as needed (back pain).     . valACYclovir (VALTREX) 1000 MG tablet Take 500 mg by mouth daily.     Marland Kitchen apixaban (ELIQUIS) 5 MG TABS tablet Take 1 tablet (5 mg total) by mouth 2 (two) times daily. (Patient not taking: Reported on 12/16/2018) 60 tablet 11  . bisacodyl (DULCOLAX) 5 MG EC tablet Take 5 mg by mouth daily.     Brian Hoover Glycol-Propyl Glycol (SYSTANE OP) Place 1 drop into both eyes daily as needed (dry eyes).    Marland Kitchen senna-docusate (SENNA-PLUS) 8.6-50 MG tablet Take 2 tablets by mouth daily.     No current  facility-administered medications for this visit.      Past Medical History:  Diagnosis Date  . Atrial fibrillation ([place])   . Back pain   . Bradycardia   . CHF (congestive heart failure) ([place])   . Chronic systolic heart failure ([place])   . COPD (chronic obstructive pulmonary disease) ([place])   . GERD (gastroesophageal reflux disease)   . Hypertension   . MGUS (monoclonal gammopathy of unknown significance) 01/25/2015  . Migraine   . Pneumothorax 01/28/2013  . PVC (premature ventricular contraction)   . Syncope    s/p Medtronic ILR implant 05/2013 by Dr [name]  . Trifascicular block     ROS:   All systems reviewed and negative except as noted in the HPI.   Past Surgical History:  Procedure Laterality Date  . CHEST TUBE INSERTION Right 01/29/2013  . CLAVICLE SURGERY    . LOOP RECORDER IMPLANT  06/02/2013   Medtronic LinQ implanted for syncope by Dr [name]  . LOOP RECORDER IMPLANT N/A 06/02/2013   Procedure: LOOP RECORDER IMPLANT;  Surgeon: [name], MD;  Location: [facility] CATH LAB;  Service: Cardiovascular;  Laterality: N/A;  . PROSTATE SURGERY    . SHOULDER ARTHROSCOPY    . TENDON REPAIR  right foot     Family History  Problem Relation Age of Onset  . Breast cancer Mother   . Uterine cancer Mother   . Rheum arthritis Son      Social History   Socioeconomic History  . Marital status: Married    Spouse name: Brian Hoover  . Number of children: 2  . Years of education: MA  . Highest education level: Not on file  Occupational History  . Occupation: retired Glass blower/designer  . Financial resource strain: Not on file  . Food insecurity:    Worry: Not on file    Inability: Not on file  . Transportation needs:    Medical: Not on file    Non-medical: Not on file  Tobacco Use  . Smoking status: Former Smoker    Packs/day: 1.50    Years: 10.00    Pack years: 15.00    Types: Cigarettes    Last attempt to quit: 12/04/1960    Years since quitting: 58.0  . Smokeless  tobacco: Never Used  Substance and Sexual Activity  . Alcohol use: Yes    Alcohol/week: 10.0 standard drinks    Types: 10 Shots of liquor per week    Comment: 4 Drinks a week  . Drug use: No  . Sexual activity: Yes    Partners: Female    Birth control/protection: None  Lifestyle  . Physical activity:    Days per week: Not on file    Minutes per session: Not on file  . Stress: Not on file  Relationships  . Social connections:    Talks on phone: Not on file    Gets together: Not on file    Attends religious service: Not on file    Active member of club or organization: Not on file    Attends meetings of clubs or organizations: Not on file    Relationship status: Not on file  . Intimate partner violence:    Fear of current or ex partner: Not on file    Emotionally abused: Not on file    Physically abused: Not on file    Forced sexual activity: Not on file  Other Topics Concern  . Not on file  Social History Narrative      Pt lives at home with his spouse.   Caffeine Use: 2 cups daily.     BP 122/70   Pulse (!) 52   Ht 5\' 10"  (1.778 m)   Wt 173 lb 3.2 oz (78.6 kg)   SpO2 97%   BMI 24.85 kg/m   Physical Exam:  Well appearing NAD HEENT: Unremarkable Neck:  No JVD, no thyromegally Lymphatics:  No adenopathy Back:  No CVA tenderness Lungs:  Clear with no wheezes HEART:  Regular rate rhythm, no murmurs, no rubs, no clicks Abd:  soft, positive bowel sounds, no organomegally, no rebound, no guarding Ext:  2 plus pulses, no edema, no cyanosis, no clubbing Skin:  No rashes no nodules Neuro:  CN II through XII intact, motor grossly intact  EKG - sinus brady  DEVICE  Normal device function.  See PaceArt for details.   Assess/Plan: 1. PVC' s- he is asymptomatic and will continue his current meds. 2. PAF - he is mostly maintaining NSR. I have asked that he continue his dofetilide. He denies non-compliance. 3. Sinus node dysfunction - he is stable and no symptoms  despite his bradycardia. 4. Heart block - he has significant conduction system disease but is currently asymptomatic.  PPM once he develops symptoms.  Brian Hoover.D.

## 2018-12-16 NOTE — Patient Instructions (Addendum)

## 2018-12-18 ENCOUNTER — Ambulatory Visit: Payer: Medicare Other | Admitting: Neurology

## 2018-12-18 ENCOUNTER — Ambulatory Visit: Payer: Medicare Other | Admitting: Internal Medicine

## 2019-01-02 ENCOUNTER — Encounter: Payer: Self-pay | Admitting: Cardiology

## 2019-01-02 ENCOUNTER — Ambulatory Visit: Payer: Medicare Other | Admitting: Cardiology

## 2019-01-02 VITALS — BP 140/60 | HR 48 | Ht 70.0 in | Wt 176.1 lb

## 2019-01-02 DIAGNOSIS — I493 Ventricular premature depolarization: Secondary | ICD-10-CM | POA: Diagnosis not present

## 2019-01-02 DIAGNOSIS — I480 Paroxysmal atrial fibrillation: Secondary | ICD-10-CM | POA: Diagnosis not present

## 2019-01-02 DIAGNOSIS — I5022 Chronic systolic (congestive) heart failure: Secondary | ICD-10-CM

## 2019-01-02 NOTE — Patient Instructions (Signed)
Medication Instructions:  The current medical regimen is effective;  continue present plan and medications.  If you need a refill on your cardiac medications before your next appointment, please call your pharmacy.   Follow-Up: At [facility], you and your health needs are our priority.  As part of our continuing mission to provide you with exceptional heart care, we have created designated Provider Care Teams.  These Care Teams include your primary Cardiologist (physician) and Advanced Practice Providers (APPs -  Physician Assistants and Nurse Practitioners) who all work together to provide you with the care you need, when you need it. You will need a follow up appointment in 6 months.  Please call our office 2 months in advance to schedule this appointment.  You may see Mark Skains, MD or one of the following Advanced Practice Providers on your designated Care Team:   Lori Gerhardt, NP Laura Ingold, NP . Jill McDaniel, NP  Thank you for choosing [facility]!!       

## 2019-01-02 NOTE — Progress Notes (Signed)
Cardiology Office Note:    Date:  01/02/2019   ID:  Brian Hoover, DOB 1932-07-18, MRN [id number]  PCP:  Brian Caraway, MD  Cardiologist:  Candee Furbish, MD   Referring MD: Brian Caraway, MD     History of Present Illness:    Brian Hoover is a 85 y.o. male  with history of paroxysmal atrial fibrillation now on dofetilide 04/2018 (previously amio but worsening paraesthesia), sinus bradycardia, PVCs, frequent falls and history of cerebrovascular bleeding (not candidate for [facility]).   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  Pacemaker may be warranted in the future.   Back pain.  [place]. [place], per Dr. Cristopher Peru. Went into afib after moving. 1 hour out of rhythm but overall has been doing quite well.  Bradycardic at baseline.  No syncope.  Back pain still is his main issue.  Denies any bleeding fevers chills nausea vomiting   Past Medical History:  Diagnosis Date  . Atrial fibrillation ([place])   . Back pain   . Bradycardia   . CHF (congestive heart failure) ([place])   . Chronic systolic heart failure ([place])   . COPD (chronic obstructive pulmonary disease) ([place])   . GERD (gastroesophageal reflux disease)   . Hypertension   . MGUS (monoclonal gammopathy of unknown significance) 01/25/2015  . Migraine   . Pneumothorax 01/28/2013  . PVC (premature ventricular contraction)   . Syncope    s/p Medtronic ILR implant 05/2013 by Dr [name]  . Trifascicular block     Past Surgical History:  Procedure Laterality Date  . CHEST TUBE INSERTION Right 01/29/2013  . CLAVICLE SURGERY    . LOOP RECORDER IMPLANT  06/02/2013   Medtronic LinQ implanted for syncope by Dr [name]  . LOOP RECORDER IMPLANT N/A 06/02/2013   Procedure: LOOP RECORDER IMPLANT;  Surgeon: [name], MD;  Location: [facility] CATH LAB;  Service: Cardiovascular;  Laterality: N/A;  . PROSTATE SURGERY    . SHOULDER ARTHROSCOPY    . TENDON REPAIR     right foot    Current  Medications: Current Meds  Medication Sig  . aspirin 325 MG tablet Take 975 mg by mouth as needed. For headaches  . carvedilol (COREG) 3.125 MG tablet Take 1 tablet (3.125 mg total) by mouth daily.  Marland Kitchen dofetilide (TIKOSYN) 250 MCG capsule Take 1 capsule (250 mcg total) by mouth 2 (two) times daily.  Marland Kitchen gabapentin (NEURONTIN) 300 MG capsule TAKE 1 CAPSULE (300 MG TOTAL) BY MOUTH 4 (FOUR) TIMES DAILY.  Marland Kitchen lisinopril (PRINIVIL,ZESTRIL) 40 MG tablet Take 1 tablet (40 mg total) by mouth daily.  Marland Kitchen omeprazole (PRILOSEC) 40 MG capsule Take 40 mg by mouth daily.  . sildenafil (VIAGRA) 100 MG tablet Take 100 mg by mouth See admin instructions.  . tapentadol (NUCYNTA) 50 MG tablet Take 50 mg by mouth every 6 (six) hours as needed (back pain).   . valACYclovir (VALTREX) 500 MG tablet Take 500 mg by mouth daily.     Allergies:   Sulfa antibiotics   Social History   Socioeconomic History  . Marital status: Married    Spouse name: [name]  . Number of children: 2  . Years of education: MA  . Highest education level: Not on file  Occupational History  . Occupation: retired Glass blower/designer  . Financial resource strain: Not on file  . Food insecurity:    Worry: Not on file    Inability: Not on file  .  Transportation needs:    Medical: Not on file    Non-medical: Not on file  Tobacco Use  . Smoking status: Former Smoker    Packs/day: 1.50    Years: 10.00    Pack years: 15.00    Types: Cigarettes    Last attempt to quit: 12/04/1960    Years since quitting: 58.1  . Smokeless tobacco: Never Used  Substance and Sexual Activity  . Alcohol use: Yes    Alcohol/week: 10.0 standard drinks    Types: 10 Shots of liquor per week    Comment: 4 Drinks a week  . Drug use: No  . Sexual activity: Yes    Partners: Female    Birth control/protection: None  Lifestyle  . Physical activity:    Days per week: Not on file    Minutes per session: Not on file  . Stress: Not on file  Relationships  .  Social connections:    Talks on phone: Not on file    Gets together: Not on file    Attends religious service: Not on file    Active member of club or organization: Not on file    Attends meetings of clubs or organizations: Not on file    Relationship status: Not on file  Other Topics Concern  . Not on file  Social History Narrative      Pt lives at home with his spouse.   Caffeine Use: 2 cups daily.     Family History: The patient's family history includes Breast cancer in his mother; Rheum arthritis in his son; Uterine cancer in his mother.  ROS:   Please see the history of present illness.     All others neg  EKGs/Labs/Other Studies Reviewed:    The following studies were reviewed today:  ECHO: 09/16/17 - Left ventricle: The cavity size was normal. Wall thickness was   increased in a pattern of moderate LVH. Systolic function was   mildly to moderately reduced. The estimated ejection fraction was   in the range of 40% to 45%. Inferior akinesis. The study is not   technically sufficient to allow evaluation of LV diastolic   function. - Aortic valve: Trileaflet. Sclerosis without stenosis. There was   no regurgitation. - Mitral valve: Calcified annulus. Mildly thickened leaflets .   There was trivial regurgitation. - Left atrium: The atrium was normal in size. - Tricuspid valve: There was trivial regurgitation. - Pulmonary arteries: PA peak pressure: 19 mm Hg (S). - Systemic veins: The IVC was not visualized.  Impressions:  - Compared to a prior echo in 06/2017, the LVEF is improved to   40-45% (from 35-40%) with inferior akinesis.  EKG:  None today  Recent Labs: 01/02/2018: ALT 21; Hemoglobin 14.6; Platelets 379; TSH 10.830 04/17/2018: BUN 15; Creatinine, Ser 1.00; Magnesium 2.0; Potassium 4.7; Sodium 135  Recent Lipid Panel    Component Value Date/Time   CHOL 172 09/16/2017 0334   TRIG 75 09/16/2017 0334   HDL 64 09/16/2017 0334   CHOLHDL 2.7 09/16/2017  0334   VLDL 15 09/16/2017 0334   LDLCALC 93 09/16/2017 0334    Physical Exam:    VS:  BP 140/60   Pulse (!) 48   Ht 5\' 10"  (1.778 m)   Wt 176 lb 1.9 oz (79.9 kg)   SpO2 98%   BMI 25.27 kg/m     Wt Readings from Last 3 Encounters:  01/02/19 176 lb 1.9 oz (79.9 kg)  12/16/18 173 lb 3.2 oz (  78.6 kg)  08/16/18 171 lb 3.2 oz (77.7 kg)     GEN: Well nourished, well developed, in no acute distress  HEENT: normal  Neck: no JVD, carotid bruits, or masses Cardiac: brady reg; no murmurs, rubs, or gallops,no edema  Respiratory:  clear to auscultation bilaterally, normal work of breathing GI: soft, nontender, nondistended, + BS MS: no deformity or atrophy  Skin: warm and dry, no rash Neuro:  Alert and Oriented x 3, Strength and sensation are intact Psych: euthymic mood, full affect    ASSESSMENT:    1. Paroxysmal atrial fibrillation (HCC)   2. PVC's (premature ventricular contractions)   3. Chronic systolic heart failure (HCC)    PLAN:    In order of problems listed above:  Paroxysmal atrial fibrillation -He is now on dofetilide.  Understands to avoid QT prolonging agents.  He is off of nortriptyline.  He was not felt to be an oral anticoagulation candidate based upon his prior hemorrhagic stroke however this was a very small region and his neurologist thought that this may be a reasonable thing for him to be on.  Even after discussion with Dr. Tomi Likens, Mr. Howry would still like to stay off of anticoagulation.  He needs to take his aspirin for his back pain he states.  He understands stroke risk.  Nonischemic cardiomyopathy -EF 40-45%.  Now the PVCs have improved hopefully this has improved as well.  Overall no change.  Overall doing well without any significant shortness of breath.  Continue with low-dose carvedilol despite bradycardia.  Paresthesias - He states that there was no real change with the gabapentin. No changes.  Main complaint still is back pain.  He takes aspirin  for this.  We will see him back in 6 months  Medication Adjustments/Labs and Tests Ordered: Current medicines are reviewed at length with the patient today.  Concerns regarding medicines are outlined above.  No orders of the defined types were placed in this encounter.  No orders of the defined types were placed in this encounter.   Signed, Candee Furbish, MD  01/02/2019 10:43 AM    [facility]

## 2019-01-09 ENCOUNTER — Other Ambulatory Visit: Payer: Self-pay | Admitting: Cardiology

## 2019-01-14 NOTE — Progress Notes (Signed)
NEUROLOGY FOLLOW UP OFFICE NOTE  Brian Hoover [id number]  HISTORY OF PRESENT ILLNESS: Brian Hoover is an 85 year old right-handed Caucasian man with paroxysmal atrial fibrillation, small hemorrhagic pontine stroke, MCI, cluster headache, COPD, HSV-2, CHF, GERD, chronic systolic heart failure, PVCs and history of pneumothorax who follows up for headaches.  HEADACHE: Update: No cluster headaches.  He has aching tension headaches daily.  May have been better with increased gabapentin but increased stress due to moving in December.  Current NSAIDs: ASA 311m with coffee helps Current antihistamine:  Benadryl 726m(to help with sleep, but still with problems) Current anticonvulsant:  Gabapentin 60049mn AM/300m86monWUJW/119JYPM (uncertain of dose)  History: He has history of cluster headaches for over 20 years as well as other headaches for many years. His cluster headaches are described as involving the right eye, 10/10 intensity, sharp stabbing pain, lasting one hour.It would occur between 1:30 and 2 am in the morning.He was previously treated with verapamil, which caused constipation.He had used O2 and nasal sprays.For many years, he has been taking Zyprexa.He takes it as needed, only when he feels he has a cluster headache.His last cluster headache was in 2012.  He also has other type of headache.It is bi-frontal, non-throbbing ache, about 6-7/10.It is not associated with other symptoms such as nausea or photophobia.It can last all day and typically occurs 4 times a month.Often, he wakes up with it.Sometimes, it would lead into a cluster, so he has taken Zyprexa recently for it, but it has not helped.He was advised to start taking the Zyprexa daily.He takes ASA or ibuprofen sparingly.They only take the edge off.Drinking two cups of coffee treats it well.He drinks coffee daily.  Past medications: Antidepressant:  Nortriptyline (effective but his  cardiologist told him he cannot take more than 50mg60mit had prolonged his Q-T interval)  HEMORRHAGIC STROKE/CRANIAL FOURTH NERVE PALSY: Update: Stable.   History: He developed vertical diplopia in 2017, particularly noticeable when he was driving.He saw ophthalmologist, Dr. BradlJola Schmidt diagnosed a fourth nerve palsy.MRI of the brain and orbits with and without contrast from 02/01/16 showed a focal area of remote hemorrhage just inferior to the third nerve nucleus and near the fourth nerve nucleus. He reportedly had fluctuations in blood pressure with systolics in the high 160s.782NZZINESS/PRESYNCOPE: Update: He had increased dizziness after increase in donepezil. We discontinued it to see if symptoms improved.   History: He has had episodes of feeling "woozy" for several years.One time it occurred after a 3 mile walk and another time it occurred when he stood up while walking in his garden.He has not lost consciousness.There is no spinning sensation but he says that the "room CCCCCCCCCCCCCCCCCCCCCCCCCCCCCCCCCswims".MRI of the brain and MRA of head from 11/05/12 were unremarkable.MRA of neck showed no stenosis except for some smooth plaque at the origin of the right ICA.Orthostatic testing was reportedly negative.  GAIT INSTABILITY AND CHRONIC BACK PAIN: Update: He sits at choir. He believes his balance problems are due to the back pain. Takes gabapentin for pain.  Went to PT. Balance is better.    History: He was also evaluated for balance issues.This has been ongoing for several years.He denies numbness in the feet or weakness in the legs.He tore his right achilles heel at that time and had right foot weakness, which improved.NCV-EMG from 03/17/13 showed evidence of active and chronic right L5 radiculopathy but no evidence of polyneuropathy.MRI of lumbar spine from 03/20/13 showed multi-level disc bulging and facet  hypertrophy but no spinal stenosis  or significant foraminal narrowing or cause for L5 radiculopathy.Neuropathy labs were performed on 01/13/14, revealing small (0.3) M-spike. ANA, Sed Rate, RF, TSH, Hgb A1c, vitamin D 25-hydroxy, and B12 were unremarkable.Repeat testing of SPEP/IFE from December 2015 again showed an M-Spike with abnormal IgA lambda, consistent with MGUS.He was referred to Dr. Alvy Bimler of Heme/Onc.It was recommended to monitor for now. He continues to have non-radiating back pain with numbness in the legs with prolonged standing, unchanged. A NCV-EMG performed on 01/14/15 showed chronic right L3-L5 radiculopathy but no evidence of generalized sensorimotor polyneuropathy.To further evaluate gait instability, an MRI of the cervical spine was performed on 01/27/15, which did not reveal spinal stenosis. MRI of lumbar spine is reportedly unchanged. There is nothing surgical as per Dr. Vertell Limber. He receives epidural injections by Dr. Maryjean Ka, which are only modestly effective. Gabapentin helpful. Tramadol and Celebrex are ineffective.   MILD COGNITIVE IMPAIRMENT: Update: Stable  History: He has noticed some memory difficulties since 2014.  Specifically, he notes some problems recalling names of casual acquaintances (people he has only met maybe a couple of times).  Sometimes, when he is driving on familiar routes to places he frequents, he needs to take a moment to re-orient himself.  This occurs with places such as restaurants, but not places he goes to often, such as the grocery store.  He denies problems with word-finding or remembering to take medication.  He manages his finances without difficulty.  He does not repeat questions.  He does not exhibit REM sleep behavior disorder.  He has not had any personality or behavioral changes.  He denies hallucinations.  There is no known family history of dementia or cognitive impairment.  He has a Brewing technologist.  Aricept was discontinued due to dizziness.   OTHERS:    PAST  MEDICAL HISTORY: Past Medical History:  Diagnosis Date  . Atrial fibrillation ([place])   . Back pain   . Bradycardia   . CHF (congestive heart failure) ([place])   . Chronic systolic heart failure ([place])   . COPD (chronic obstructive pulmonary disease) ([place])   . GERD (gastroesophageal reflux disease)   . Hypertension   . MGUS (monoclonal gammopathy of unknown significance) 01/25/2015  . Migraine   . Pneumothorax 01/28/2013  . PVC (premature ventricular contraction)   . Syncope    s/p Medtronic ILR implant 05/2013 by Dr [name]  . Trifascicular block     MEDICATIONS: Current Outpatient Medications on File Prior to Visit  Medication Sig Dispense Refill  . aspirin 325 MG tablet Take 975 mg by mouth as needed. For headaches    . carvedilol (COREG) 3.125 MG tablet TAKE 1 TABLET BY MOUTH TWICE A DAY WITH A MEAL 180 tablet 3  . dofetilide (TIKOSYN) 250 MCG capsule Take 1 capsule (250 mcg total) by mouth 2 (two) times daily. 180 capsule 3  . gabapentin (NEURONTIN) 300 MG capsule TAKE 1 CAPSULE (300 MG TOTAL) BY MOUTH 4 (FOUR) TIMES DAILY. 360 capsule 2  . lisinopril (PRINIVIL,ZESTRIL) 40 MG tablet Take 1 tablet (40 mg total) by mouth daily. 90 tablet 3  . omeprazole (PRILOSEC) 40 MG capsule Take 40 mg by mouth daily.    . sildenafil (VIAGRA) 100 MG tablet Take 100 mg by mouth See admin instructions.  1  . tapentadol (NUCYNTA) 50 MG tablet Take 50 mg by mouth every 6 (six) hours as needed (back pain).     . valACYclovir (VALTREX) 500 MG tablet Take 500 mg by  mouth daily.     No current facility-administered medications on file prior to visit.     ALLERGIES: Allergies  Allergen Reactions  . Sulfa Antibiotics Hives         FAMILY HISTORY: Family History  Problem Relation Age of Onset  . Breast cancer Mother   . Uterine cancer Mother   . Rheum arthritis Son    SOCIAL HISTORY: Social History   Socioeconomic History  . Marital status: Married    Spouse name: [name]  . Number of  children: 2  . Years of education: MA  . Highest education level: Not on file  Occupational History  . Occupation: retired Glass blower/designer  . Financial resource strain: Not on file  . Food insecurity:    Worry: Not on file    Inability: Not on file  . Transportation needs:    Medical: Not on file    Non-medical: Not on file  Tobacco Use  . Smoking status: Former Smoker    Packs/day: 1.50    Years: 10.00    Pack years: 15.00    Types: Cigarettes    Last attempt to quit: 12/04/1960    Years since quitting: 58.1  . Smokeless tobacco: Never Used  Substance and Sexual Activity  . Alcohol use: Yes    Alcohol/week: 10.0 standard drinks    Types: 10 Shots of liquor per week    Comment: 4 Drinks a week  . Drug use: No  . Sexual activity: Yes    Partners: Female    Birth control/protection: None  Lifestyle  . Physical activity:    Days per week: Not on file    Minutes per session: Not on file  . Stress: Not on file  Relationships  . Social connections:    Talks on phone: Not on file    Gets together: Not on file    Attends religious service: Not on file    Active member of club or organization: Not on file    Attends meetings of clubs or organizations: Not on file    Relationship status: Not on file  . Intimate partner violence:    Fear of current or ex partner: Not on file    Emotionally abused: Not on file    Physically abused: Not on file    Forced sexual activity: Not on file  Other Topics Concern  . Not on file  Social History Narrative      Pt lives at home with his spouse.   Caffeine Use: 2 cups daily.    REVIEW OF SYSTEMS: Constitutional: No fevers, chills, or sweats, no generalized fatigue, change in appetite Eyes: No visual changes, double vision, eye pain Ear, nose and throat: No hearing loss, ear pain, nasal congestion, sore throat Cardiovascular: No chest pain, palpitations Respiratory:  No shortness of breath at rest or with exertion,  wheezes GastrointestinaI: No nausea, vomiting, diarrhea, abdominal pain, fecal incontinence Genitourinary:  No dysuria, urinary retention or frequency Musculoskeletal:  No neck pain, back pain Integumentary: No rash, pruritus, skin lesions Neurological: as above Psychiatric: No depression, insomnia, anxiety Endocrine: No palpitations, fatigue, diaphoresis, mood swings, change in appetite, change in weight, increased thirst Hematologic/Lymphatic:  No purpura, petechiae. Allergic/Immunologic: no itchy/runny eyes, nasal congestion, recent allergic reactions, rashes  PHYSICAL EXAM: Blood pressure (!) 142/82, pulse (!) 50, height _0  (1.778 m), weight 175 lb (79.4 kg), SpO2 94 %. General: No acute distress.  Patient appears well-groomed.   Head:  Normocephalic/atraumatic Eyes:  Fundi examined but not visualized Neck: supple, no paraspinal tenderness, full range of motion Heart:  Regular rate and rhythm Lungs:  Clear to auscultation bilaterally Back: No paraspinal tenderness Neurological Exam: alert and oriented to person, place, and time. Attention span and concentration intact, recent and remote memory intact, fund of knowledge intact.  Speech fluent and not dysarthric, language intact.  CN II-XII intact. Bulk and tone normal, muscle strength 5/5 throughout.  Sensation to light touch intact.  Deep tendon reflexes 1+ throughout.  Finger to nose testing intact.  Gait normal, Romberg negative.  IMPRESSION: 1.  Episodic tension-type headache, not intractable 2.  Remote hemorrhagic stroke causing fourth nerve palsy, likely secondary to hypertension, resolved 3.  Mild cognitive impairment 4.  Paroxysmal atrial fibrillation 5.  Chronic low back pain.  PLAN: 1.  He is not taking Eliquis because he says he needs to take NSAIDs for his headaches and doesn't want to increase his risk for bleeding.  I will prescribe him Fioricet (limited amount).  If effective, then maybe he can use this for  abortive therapy and start Eliquis. 2.  Limit use of pain relievers to no more than 2 days out of week to prevent risk of rebound or medication-overuse headache. 3.  He will verify exact dose of gabapentin he is taking and contact us.  I will increase dose accordingly. 4.  Follow up with PCP regarding blood pressure 5.  Mediterranean diet. 6.  Advised to follow sleep hygiene sheet and take melatonin for sleep. 7.  Follow up in 5 to 6 months  25 minutes spent face to face with patient, over 50% spent discussing management.  Metta Clines, DO  CC: [name], MD

## 2019-01-16 ENCOUNTER — Ambulatory Visit: Payer: Medicare Other | Admitting: Neurology

## 2019-01-16 ENCOUNTER — Encounter: Payer: Self-pay | Admitting: Neurology

## 2019-01-16 ENCOUNTER — Telehealth: Payer: Self-pay | Admitting: Neurology

## 2019-01-16 VITALS — BP 142/82 | HR 50 | Ht 70.0 in | Wt 175.0 lb

## 2019-01-16 DIAGNOSIS — I10 Essential (primary) hypertension: Secondary | ICD-10-CM

## 2019-01-16 DIAGNOSIS — I619 Nontraumatic intracerebral hemorrhage, unspecified: Secondary | ICD-10-CM | POA: Diagnosis not present

## 2019-01-16 DIAGNOSIS — G44219 Episodic tension-type headache, not intractable: Secondary | ICD-10-CM | POA: Diagnosis not present

## 2019-01-16 DIAGNOSIS — I480 Paroxysmal atrial fibrillation: Secondary | ICD-10-CM | POA: Diagnosis not present

## 2019-01-16 MED ORDER — GABAPENTIN 300 MG PO CAPS: 300.0000 mg | ORAL_CAPSULE | ORAL | 5 refills | Status: DC

## 2019-01-16 MED ORDER — BUTALBITAL-APAP-CAFFEINE 50-325-40 MG PO TABS: 1.0000 | ORAL_TABLET | Freq: Four times a day (QID) | ORAL | 3 refills | Status: AC | PRN

## 2019-01-16 NOTE — Telephone Encounter (Signed)
Patient is currently taking 1500mg  but wants to increase it to 1800mg  a day of the gabapentin medication. Please send to the [facility] on file. Thanks!

## 2019-01-16 NOTE — Patient Instructions (Signed)
1.  Contact us with the exact dose of gabapentin and I will increase dose accordingly. 2.  When you get a headache, try taking the butalbital-acetaminophen-caffeine pill as directed.  However, limit use to no more than 2 days out of week to prevent rebound headache.  If effective, then I would rather you use this instead of an NSAID and start Eliquis. 3.  Follow Mediterranean diet 4.  Keep headache diary 5.  Follow up in 6 months.   Mediterranean Diet A Mediterranean diet refers to food and lifestyle choices that are based on the traditions of countries located on the [employer]. This way of eating has been shown to help prevent certain conditions and improve outcomes for people who have chronic diseases, like kidney disease and heart disease. What are tips for following this plan? Lifestyle  Cook and eat meals together with your family, when possible.  Drink enough fluid to keep your urine clear or pale yellow.  Be physically active every day. This includes: ? Aerobic exercise like running or swimming. ? Leisure activities like gardening, walking, or housework.  Get 7-8 hours of sleep each night.  If recommended by your health care provider, drink red wine in moderation. This means 1 glass a day for nonpregnant women and 2 glasses a day for men. A glass of wine equals 5 oz (150 mL). Reading food labels   Check the serving size of packaged foods. For foods such as rice and pasta, the serving size refers to the amount of cooked product, not dry.  Check the total fat in packaged foods. Avoid foods that have saturated fat or trans fats.  Check the ingredients list for added sugars, such as corn syrup. Shopping  At the grocery store, buy most of your food from the areas near the walls of the store. This includes: ? Fresh fruits and vegetables (produce). ? Grains, beans, nuts, and seeds. Some of these may be available in unpackaged forms or large amounts (in bulk). ? Fresh  seafood. ? Poultry and eggs. ? Low-fat dairy products.  Buy whole ingredients instead of prepackaged foods.  Buy fresh fruits and vegetables in-season from local farmers markets.  Buy frozen fruits and vegetables in resealable bags.  If you do not have access to quality fresh seafood, buy precooked frozen shrimp or canned fish, such as tuna, salmon, or sardines.  Buy small amounts of raw or cooked vegetables, salads, or olives from the deli or salad bar at your store.  Stock your pantry so you always have certain foods on hand, such as olive oil, canned tuna, canned tomatoes, rice, pasta, and beans. Cooking  Cook foods with extra-virgin olive oil instead of using butter or other vegetable oils.  Have meat as a side dish, and have vegetables or grains as your main dish. This means having meat in small portions or adding small amounts of meat to foods like pasta or stew.  Use beans or vegetables instead of meat in common dishes like chili or lasagna.  Experiment with different cooking methods. Try roasting or broiling vegetables instead of steaming or sauteing them.  Add frozen vegetables to soups, stews, pasta, or rice.  Add nuts or seeds for added healthy fat at each meal. You can add these to yogurt, salads, or vegetable dishes.  Marinate fish or vegetables using olive oil, lemon juice, garlic, and fresh herbs. Meal planning   Plan to eat 1 vegetarian meal one day each week. Try to work up to 2  vegetarian meals, if possible.  Eat seafood 2 or more times a week.  Have healthy snacks readily available, such as: ? Vegetable sticks with hummus. ? [place] yogurt. ? Fruit and nut trail mix.  Eat balanced meals throughout the week. This includes: ? Fruit: 2-3 servings a day ? Vegetables: 4-5 servings a day ? Low-fat dairy: 2 servings a day ? Fish, poultry, or lean meat: 1 serving a day ? Beans and legumes: 2 or more servings a week ? Nuts and seeds: 1-2 servings a  day ? Whole grains: 6-8 servings a day ? Extra-virgin olive oil: 3-4 servings a day  Limit red meat and sweets to only a few servings a month What are my food choices?  Mediterranean diet ? Recommended ? Grains: Whole-grain pasta. Brown rice. Bulgar wheat. Polenta. Couscous. Whole-wheat bread. [place]. ? Vegetables: Artichokes. Beets. Broccoli. Cabbage. Carrots. Eggplant. Green beans. Chard. Kale. Spinach. Onions. Leeks. Peas. Squash. Tomatoes. Peppers. Radishes. ? Fruits: Apples. Apricots. Avocado. Berries. Bananas. Cherries. Dates. Figs. Grapes. Lemons. Melon. Oranges. Peaches. Plums. Pomegranate. ? Meats and other protein foods: Beans. Almonds. Sunflower seeds. Pine nuts. Peanuts. [place]. Salmon. [place]all[place]. Shrimp. [place]. Tilapia. Clams. Oysters. Eggs. ? Dairy: Low-fat milk. Cheese. Greek yogurt. ? Beverages: Water. Red wine. Herbal tea. ? Fats and oils: Extra virgin olive oil. Avocado oil. Grape seed oil. ? Sweets and desserts: [place] yogurt with honey. Baked apples. Poached pears. Trail mix. ? Seasoning and other foods: Basil. Cilantro. Coriander. Cumin. Mint. Parsley. Sage. Rosemary. Tarragon. Garlic. Oregano. Thyme. Pepper. Balsalmic vinegar. Tahini. Hummus. Tomato sauce. Olives. Mushrooms. ? Limit these ? Grains: Prepackaged pasta or rice dishes. Prepackaged cereal with added sugar. ? Vegetables: Deep fried potatoes (french fries). ? Fruits: Fruit canned in syrup. ? Meats and other protein foods: Beef. Pork. Lamb. Poultry with skin. Hot dogs. [place] Salines. ? Dairy: Ice cream. Sour cream. Whole milk. ? Beverages: Juice. Sugar-sweetened soft drinks. Beer. Liquor and spirits. ? Fats and oils: Butter. Canola oil. Vegetable oil. Beef fat (tallow). Lard. ? Sweets and desserts: Cookies. Cakes. Pies. Candy. ? Seasoning and other foods: Mayonnaise. Premade sauces and marinades. ? The items listed may not be a complete list. Talk with your dietitian about what dietary choices are right for  you. Summary  The Mediterranean diet includes both food and lifestyle choices.  Eat a variety of fresh fruits and vegetables, beans, nuts, seeds, and whole grains.  Limit the amount of red meat and sweets that you eat.  Talk with your health care provider about whether it is safe for you to drink red wine in moderation. This means 1 glass a day for nonpregnant women and 2 glasses a day for men. A glass of wine equals 5 oz (150 mL). This information is not intended to replace advice given to you by your health care provider. Make sure you discuss any questions you have with your health care provider. Document Released: 07/13/2016 Document Revised: 08/15/2016 Document Reviewed: 07/13/2016 Elsevier Interactive Patient Education  2019 [employer].

## 2019-01-21 ENCOUNTER — Other Ambulatory Visit: Payer: Self-pay | Admitting: Gastroenterology

## 2019-01-30 ENCOUNTER — Encounter (HOSPITAL_COMMUNITY): Payer: Self-pay | Admitting: *Deleted

## 2019-01-31 ENCOUNTER — Ambulatory Visit (HOSPITAL_COMMUNITY)
Admission: RE | Admit: 2019-01-31 | Discharge: 2019-01-31 | Disposition: A | Discharge status: Home / Self Care | Payer: Medicare Other | Attending: Gastroenterology | Admitting: Gastroenterology

## 2019-01-31 ENCOUNTER — Encounter (HOSPITAL_COMMUNITY)
Admission: RE | Disposition: A | Discharge status: Home / Self Care | Payer: Self-pay | Source: Home / Self Care | Attending: Gastroenterology

## 2019-01-31 ENCOUNTER — Other Ambulatory Visit: Payer: Self-pay

## 2019-01-31 ENCOUNTER — Ambulatory Visit (HOSPITAL_COMMUNITY): Payer: Medicare Other | Admitting: Certified Registered Nurse Anesthetist

## 2019-01-31 ENCOUNTER — Encounter (HOSPITAL_COMMUNITY): Payer: Self-pay | Admitting: *Deleted

## 2019-01-31 DIAGNOSIS — Z87891 Personal history of nicotine dependence: Secondary | ICD-10-CM | POA: Insufficient documentation

## 2019-01-31 DIAGNOSIS — I509 Heart failure, unspecified: Secondary | ICD-10-CM | POA: Insufficient documentation

## 2019-01-31 DIAGNOSIS — Z79899 Other long term (current) drug therapy: Secondary | ICD-10-CM | POA: Insufficient documentation

## 2019-01-31 DIAGNOSIS — K5730 Diverticulosis of large intestine without perforation or abscess without bleeding: Secondary | ICD-10-CM | POA: Diagnosis not present

## 2019-01-31 DIAGNOSIS — I110 Hypertensive heart disease with heart failure: Secondary | ICD-10-CM | POA: Insufficient documentation

## 2019-01-31 DIAGNOSIS — Z7982 Long term (current) use of aspirin: Secondary | ICD-10-CM | POA: Insufficient documentation

## 2019-01-31 DIAGNOSIS — K648 Other hemorrhoids: Secondary | ICD-10-CM | POA: Diagnosis not present

## 2019-01-31 DIAGNOSIS — I4891 Unspecified atrial fibrillation: Secondary | ICD-10-CM | POA: Diagnosis not present

## 2019-01-31 DIAGNOSIS — R194 Change in bowel habit: Secondary | ICD-10-CM | POA: Insufficient documentation

## 2019-01-31 DIAGNOSIS — J449 Chronic obstructive pulmonary disease, unspecified: Secondary | ICD-10-CM | POA: Insufficient documentation

## 2019-01-31 DIAGNOSIS — R197 Diarrhea, unspecified: Secondary | ICD-10-CM | POA: Diagnosis present

## 2019-01-31 HISTORY — PX: COLONOSCOPY WITH PROPOFOL: SHX5780

## 2019-01-31 HISTORY — PX: BIOPSY: SHX5522

## 2019-01-31 SURGERY — COLONOSCOPY WITH PROPOFOL
Anesthesia: Monitor Anesthesia Care

## 2019-01-31 MED ORDER — LACTATED RINGERS IV SOLN
INTRAVENOUS | Status: DC
  Administered 2019-01-31: 1000 mL via INTRAVENOUS

## 2019-01-31 MED ORDER — LIDOCAINE 2% (20 MG/ML) 5 ML SYRINGE
INTRAMUSCULAR | Status: DC | PRN
  Administered 2019-01-31: 80 mg via INTRAVENOUS

## 2019-01-31 MED ORDER — PROPOFOL 10 MG/ML IV BOLUS
INTRAVENOUS | Status: AC
  Filled 2019-01-31: qty 60

## 2019-01-31 MED ORDER — PROPOFOL 10 MG/ML IV BOLUS
INTRAVENOUS | Status: DC | PRN
  Administered 2019-01-31: 10 mg via INTRAVENOUS
  Administered 2019-01-31: 40 mg via INTRAVENOUS
  Administered 2019-01-31 (×2): 20 mg via INTRAVENOUS

## 2019-01-31 MED ORDER — PROPOFOL 500 MG/50ML IV EMUL
INTRAVENOUS | Status: DC | PRN
  Administered 2019-01-31: 50 ug/kg/min via INTRAVENOUS

## 2019-01-31 SURGICAL SUPPLY — 22 items

## 2019-01-31 NOTE — Anesthesia Preprocedure Evaluation (Addendum)
Anesthesia Evaluation  Patient identified by MRN, date of birth, ID band Patient awake    Reviewed: Allergy & Precautions, NPO status , Patient's Chart, lab work & pertinent test results  Airway Mallampati: I  TM Distance: >3 FB Neck ROM: Full    Dental  (+) Teeth Intact, Dental Advisory Given, Poor Dentition   Pulmonary COPD, former smoker,    breath sounds clear to auscultation       Cardiovascular hypertension, Pt. on home beta blockers and Pt. on medications +CHF  + dysrhythmias Atrial Fibrillation  Rhythm:Regular Rate:Normal     Neuro/Psych  Headaches,    GI/Hepatic Neg liver ROS, GERD  ,  Endo/Other  negative endocrine ROS  Renal/GU negative Renal ROS     Musculoskeletal negative musculoskeletal ROS (+)   Abdominal Normal abdominal exam  (+)   Peds  Hematology negative hematology ROS (+)   Anesthesia Other Findings   Reproductive/Obstetrics                            Lab Results  Component Value Date   WBC 8.3 01/02/2018   HGB 14.6 01/02/2018   HCT 42.3 01/02/2018   MCV 94 01/02/2018   PLT 379 01/02/2018   Lab Results  Component Value Date   CREATININE 1.00 04/17/2018   BUN 15 04/17/2018   NA 135 04/17/2018   K 4.7 04/17/2018   CL 98 (L) 04/17/2018   CO2 31 04/17/2018   Lab Results  Component Value Date   INR 0.92 01/29/2013   INR 1.0 ratio 05/04/2010     Anesthesia Physical Anesthesia Plan  ASA: III  Anesthesia Plan: MAC   Post-op Pain Management:    Induction: Intravenous  PONV Risk Score and Plan: 0 and Propofol infusion  Airway Management Planned: Simple Face Mask and Natural Airway  Additional Equipment: None  Intra-op Plan:   Post-operative Plan: Extubation in OR  Informed Consent: I have reviewed the patients History and Physical, chart, labs and discussed the procedure including the risks, benefits and alternatives for the proposed anesthesia  with the patient or authorized representative who has indicated his/her understanding and acceptance.     Dental advisory given  Plan Discussed with: CRNA  Anesthesia Plan Comments:        Anesthesia Quick Evaluation

## 2019-01-31 NOTE — H&P (Signed)
The patient is an 85 year old male who is here today for a colonoscopy.  He has been having lower abdominal discomfort and change in bowel movements with explosive bowel movements at times.  No other change in history  Physical: No distress, heart regular rhythm no murmurs, lungs clear, abdomen soft and nontender  Impression: Lower abdominal pain, change in bowel habits, history of diverticulosis  Plan: Colonoscopy

## 2019-01-31 NOTE — Discharge Instructions (Signed)

## 2019-01-31 NOTE — Op Note (Signed)
[facility] Patient Name: Brian Hoover Procedure Date: 01/31/2019 MRN: [id number] Attending MD: [name] , MD Date of Birth: [birth year] CSN: [id number] Age: 85 Admit Type: Outpatient Procedure:                Colonoscopy Indications:              Clinically significant diarrhea of unexplained                            origin, Change in bowel habits Providers:                [name], MD, Jeanella Cara, RN,                            Cherylynn Ridges, Technician, Cathe Mons, CRNA Referring MD:              Medicines:                Propofol per Anesthesia Complications:            No immediate complications. Estimated Blood Loss:     Estimated blood loss was minimal. Procedure:                Pre-Anesthesia Assessment:                           - Prior to the procedure, a History and Physical                            was performed, and patient medications and                            allergies were reviewed. The patient's tolerance of                            previous anesthesia was also reviewed. The risks                            and benefits of the procedure and the sedation                            options and risks were discussed with the patient.                            All questions were answered, and informed consent                            was obtained. Prior Anticoagulants: The patient has                            taken no previous anticoagulant or antiplatelet                            agents. ASA Grade Assessment: III - A patient with  severe systemic disease. After reviewing the risks                            and benefits, the patient was deemed in                            satisfactory condition to undergo the procedure.                           After obtaining informed consent, the colonoscope                            was passed under direct vision. Throughout the          procedure, the patient's blood pressure, pulse, and                            oxygen saturations were monitored continuously. The                            [id number]-[id number] ([id number]) Olympus pediatric colonscope                            was introduced through the anus and advanced to the                            the cecum, identified by appendiceal orifice and                            ileocecal valve. The ileocecal valve, appendiceal                            orifice, and rectum were photographed. The                            colonoscopy was performed without difficulty. The                            patient tolerated the procedure well. The quality                            of the bowel preparation was good. Scope In: 2:09:52 PM Scope Out: 2:27:43 PM Scope Withdrawal Time: 0 hours 9 minutes 37 seconds  Total Procedure Duration: 0 hours 17 minutes 51 seconds  Findings:      The perianal and digital rectal examinations were normal.      Multiple diverticula were found in the sigmoid colon and descending       colon.      The exam was otherwise without abnormality.      Biopsies for histology were taken with a cold forceps for evaluation of       microscopic colitis.      Internal hemorrhoids were found during retroflexion. The hemorrhoids       were small. Impression:               - Diverticulosis in the sigmoid colon and  in the                            descending colon.                           - The examination was otherwise normal.                           - Internal hemorrhoids.                           - Biopsies were taken with a cold forceps for                            evaluation of microscopic colitis. Moderate Sedation:      . Recommendation:           - Resume regular diet.                           - Continue present medications.                           - Await pathology results.                           - Repeat colonoscopy is not recommended for                             screening purposes. Procedure Code(s):        --- Professional ---                           [id number], Colonoscopy, flexible; with biopsy, single                            or multiple Diagnosis Code(s):        --- Professional ---                           K64.8, Other hemorrhoids                           R19.7, Diarrhea, unspecified                           R19.4, Change in bowel habit                           K57.30, Diverticulosis of large intestine without                            perforation or abscess without bleeding CPT copyright 2018 American Medical Association. All rights reserved. The codes documented in this report are preliminary and upon coder review may  be revised to meet current compliance requirements. [name], MD 01/31/2019 2:34:36 PM This report has been signed electronically. Number of Addenda: 0

## 2019-01-31 NOTE — Transfer of Care (Signed)
Immediate Anesthesia Transfer of Care Note  Patient: Brian Hoover  Procedure(s) Performed: COLONOSCOPY WITH PROPOFOL (N/A ) BIOPSY  Patient Location: Endoscopy Unit  Anesthesia Type:MAC  Level of Consciousness: drowsy and patient cooperative  Airway & Oxygen Therapy: Patient Spontanous Breathing and Patient connected to face mask oxygen  Post-op Assessment: Report given to RN and Post -op Vital signs reviewed and stable  Post vital signs: Reviewed and stable  Last Vitals:  Vitals Value Taken Time  BP 158/56 01/31/2019  2:35 PM  Temp    Pulse 53 01/31/2019  2:35 PM  Resp 14 01/31/2019  2:35 PM  SpO2 100 % 01/31/2019  2:35 PM  Vitals shown include unvalidated device data.  Last Pain:  Vitals:   01/31/19 1313  PainSc: 0-No pain         Complications: No apparent anesthesia complications

## 2019-02-03 ENCOUNTER — Encounter (HOSPITAL_COMMUNITY): Payer: Self-pay | Admitting: Gastroenterology

## 2019-02-03 NOTE — Anesthesia Postprocedure Evaluation (Signed)
Anesthesia Post Note  Patient: Brian Hoover  Procedure(s) Performed: COLONOSCOPY WITH PROPOFOL (N/A ) BIOPSY     Patient location during evaluation: PACU Anesthesia Type: MAC Level of consciousness: awake and alert Pain management: pain level controlled Vital Signs Assessment: post-procedure vital signs reviewed and stable Respiratory status: spontaneous breathing, nonlabored ventilation, respiratory function stable and patient connected to nasal cannula oxygen Cardiovascular status: stable and blood pressure returned to baseline Postop Assessment: no apparent nausea or vomiting Anesthetic complications: no    Last Vitals:  Vitals:   01/31/19 1435 01/31/19 1445  BP: (!) 158/56 (!) 141/59  Pulse: (!) 52 (!) 50  Resp: 17 17  Temp: 36.8 C   SpO2: 100% 100%    Last Pain:  Vitals:   02/03/19 1534  TempSrc:   PainSc: 0-No pain                 Effie Berkshire

## 2019-02-10 ENCOUNTER — Ambulatory Visit (INDEPENDENT_AMBULATORY_CARE_PROVIDER_SITE_OTHER): Payer: Medicare Other | Admitting: Orthopaedic Surgery

## 2019-03-05 ENCOUNTER — Other Ambulatory Visit: Payer: Self-pay | Admitting: Cardiology

## 2019-04-14 ENCOUNTER — Telehealth: Payer: Self-pay | Admitting: Cardiology

## 2019-04-14 NOTE — Telephone Encounter (Signed)
Pt reports, "Feeling cruddy lately, achy, not feeling real good, I don't think it is heart related, but my wife called concerned". Reports hx of AFib/PVCs.  States heart monitor has been fairly consistent, pulse mid 40-50s, NSR.  Pt reports low HRs baseline for him.   SBP 130s avg Denies AFib, PVCs, dizziness/weakness/fatigue, syncope, SOB, CP/discomfort. Pt aware I will send this to Dr. [name] and [name] for review.  He understands the nurse will contact him if they feel pt needs OV to further discuss concerns. Advised pt to call his PCP to discuss his "cruddy feeling".  COVID-19 screening negative.  Pt states he is going to follow up with his neurologist as he thinks it may be more related. He appreciates the return call.

## 2019-04-14 NOTE — Telephone Encounter (Signed)
New message:   Patient wife calling to get a appt this week. Patient is weak and do not feel well at all.please call patient wife.

## 2019-04-17 NOTE — Telephone Encounter (Signed)
Called to f/u with patient to see how he is feeling.  He reports he is feeling fine and doesn't think he needs to be seen at this time.  Advised he is due back for f/u in 06/2019 but if he has any concerns or needs prior to then to please call.  Pt states understanding and thanked me for the call.

## 2019-04-30 ENCOUNTER — Telehealth: Payer: Self-pay | Admitting: Neurology

## 2019-04-30 ENCOUNTER — Telehealth: Payer: Self-pay | Admitting: Cardiology

## 2019-04-30 NOTE — Telephone Encounter (Signed)
The patient stated he has been taking morphine sulfate 15 mg, once or twice a day for back pain and wanted to know if the medication was safe for him. He also wants to know if Donepezil is safe.

## 2019-04-30 NOTE — Telephone Encounter (Signed)
OK from a cardiac perspective. Would make sure his PCP answers this question.  Candee Furbish, MD

## 2019-04-30 NOTE — Telephone Encounter (Signed)
Called and advised Pt the Aricept was d/c due to dizziness

## 2019-04-30 NOTE — Telephone Encounter (Signed)
Patient called in about the donzepil medication and wanted to know if you could call it into the [facility] on file. Thanks!

## 2019-04-30 NOTE — Telephone Encounter (Signed)
° ° °  Pt c/o medication issue:  1. Name of Medication: DONEPEZIL  2. How are you currently taking this medication (dosage and times per day)? N/A  3. Are you having a reaction (difficulty breathing--STAT)? N 4. What is your medication issue? PATIENT WANTS TO KNOW IF MED IS SAFE FOR HIM  TO TAKE

## 2019-04-30 NOTE — Telephone Encounter (Signed)
Pt is aware OK per Dr [name] from cardiac perspective but should f/u with PCP.  Pt states the nurse at the neurologist's office reminded him that it caused him to be dizzy in the past and he is not going to take it.  He thanked me for the call and information.

## 2019-05-20 ENCOUNTER — Telehealth: Payer: Self-pay | Admitting: Neurology

## 2019-05-20 NOTE — Telephone Encounter (Signed)
[name] spouse called stating increased HA along with issues with medications. Spouse requesting appt this week if possible or any day early next week with any provider.Agreed to virtual on 05/22/19 with Tomi Likens; spouse would like in person appt. Please call [name] today to discuss issues [phone]. She may cancel virtual.

## 2019-05-20 NOTE — Telephone Encounter (Signed)
Called and spoke with [name]. Explained to her we are limiting pt's in the office due to [place] exposure. They will keep appt Thursday morning.

## 2019-05-21 ENCOUNTER — Encounter: Payer: Self-pay | Admitting: Neurology

## 2019-05-21 ENCOUNTER — Other Ambulatory Visit: Payer: Self-pay

## 2019-05-21 DIAGNOSIS — R413 Other amnesia: Secondary | ICD-10-CM | POA: Insufficient documentation

## 2019-05-21 DIAGNOSIS — I429 Cardiomyopathy, unspecified: Secondary | ICD-10-CM | POA: Insufficient documentation

## 2019-05-21 DIAGNOSIS — I493 Ventricular premature depolarization: Secondary | ICD-10-CM | POA: Insufficient documentation

## 2019-05-21 DIAGNOSIS — N529 Male erectile dysfunction, unspecified: Secondary | ICD-10-CM | POA: Insufficient documentation

## 2019-05-21 DIAGNOSIS — K219 Gastro-esophageal reflux disease without esophagitis: Secondary | ICD-10-CM | POA: Insufficient documentation

## 2019-05-21 DIAGNOSIS — R519 Headache, unspecified: Secondary | ICD-10-CM | POA: Insufficient documentation

## 2019-05-21 DIAGNOSIS — G44009 Cluster headache syndrome, unspecified, not intractable: Secondary | ICD-10-CM | POA: Insufficient documentation

## 2019-05-21 DIAGNOSIS — K9289 Other specified diseases of the digestive system: Secondary | ICD-10-CM | POA: Insufficient documentation

## 2019-05-21 DIAGNOSIS — L219 Seborrheic dermatitis, unspecified: Secondary | ICD-10-CM | POA: Insufficient documentation

## 2019-05-21 DIAGNOSIS — N400 Enlarged prostate without lower urinary tract symptoms: Secondary | ICD-10-CM | POA: Insufficient documentation

## 2019-05-21 DIAGNOSIS — R001 Bradycardia, unspecified: Secondary | ICD-10-CM | POA: Insufficient documentation

## 2019-05-21 NOTE — Progress Notes (Addendum)
Virtual Visit via Telephone Note The purpose of this virtual visit is to provide medical care while limiting exposure to the novel coronavirus.    Consent was obtained for phone visit:  Yes.   Answered questions that patient had about telehealth interaction:  Yes.   I discussed the limitations, risks, security and privacy concerns of performing an evaluation and management service by telephone. I also discussed with the patient that there may be a patient responsible charge related to this service. The patient expressed understanding and agreed to proceed.  Pt location: Home Physician Location: office Name of referring provider:  [name], MD I connected with .Brian Hoover at patients initiation/request on 05/22/2019 at  8:50 AM EDT by telephone and verified that I am speaking with the correct person using two identifiers.  Pt MRN:  [id number] Pt DOB:  Nov 05, 1932   History of Present Illness:  Brian Hoover is an 85 year old right-handed Caucasian man with paroxysmal atrial fibrillation, small hemorrhagic pontine stroke, MCI, cluster headache, COPD, HSV-2, CHF, GERD, chronic systolic heart failure, PVCs and history of pneumothorax who follows up for headaches.  HEADACHE: Update: Usually with 3 to 4 headaches a week, lasting 3 to 4 hours. Reports increased headaches this past week.  He has had problems with GI complaints and indigestion but has also had increased headaches.   Current NSAIDs: ASA '325mg'$  with coffee helps Current analgesics:  Fioricet (has not used it) Current antihistamine:  Benadryl '75mg'$  (to help with sleep, but still with problems) Current anticonvulsant:  Gabapentin '600mg'$  in AM/'600mg'$  noon/'600mg'$  in PM (uncertain of dose)  Drinks plenty of coffee daily.  History: He has history of cluster headaches for over 20 years as well as other headaches for many years. His cluster headaches are described as involving the right eye, 10/10 intensity, sharp stabbing  pain, lasting one hour.It would occur between 1:30 and 2 am in the morning.He was previously treated with verapamil, which caused constipation.He had used O2 and nasal sprays.For many years, he has been taking Zyprexa.He takes it as needed, only when he feels he has a cluster headache.His last cluster headache was in 2012.  He also has other type of headache.It is bi-frontal, non-throbbing ache, about 6-7/10.It is not associated with other symptoms such as nausea or photophobia.It can last all day and typically occurs 4 times a month.Often, he wakes up with it.Sometimes, it would lead into a cluster, so he has taken Zyprexa recently for it, but it has not helped.He was advised to start taking the Zyprexa daily.He takes ASA or ibuprofen sparingly.They only take the edge off.Drinking two cups of coffee treats it well.He drinks coffee daily.  Past medications: Antidepressant:  Nortriptyline (effective but it had prolonged his Q-T interval)  HEMORRHAGIC STROKE/CRANIAL FOURTH NERVE PALSY: Update: Stable.   History: He developed vertical diplopia in 2017, particularly noticeable when he was driving.He saw ophthalmologist, Dr. Jola Schmidt, who diagnosed a fourth nerve palsy.MRI of the brain and orbits with and without contrast from 02/01/16 showed a focal area of remote hemorrhage just inferior to the third nerve nucleus and near the fourth nerve nucleus.He reportedly had fluctuations in blood pressure with systolics in the high 226J.  DIZZINESS/PRESYNCOPE: Update: He had increased dizziness after increase in donepezil. We discontinued it to see if symptoms improved.   History: He has had episodes of feeling "woozy" for several years.One time it occurred after a 3 mile walk and another time it occurred when he stood up while walking in  his garden.He has not lost consciousness.There is no spinning sensation but he says that the "room swims".MRI of  the brain and MRA of head from 11/05/12 were unremarkable.MRA of neck showed no stenosis except for some smooth plaque at the origin of the right ICA.Orthostatic testing was reportedly negative.  GAIT INSTABILITY AND CHRONIC BACK PAIN: Update: He sits at choir. He believes his balance problems are due to the back pain. Takes gabapentin for pain.  Went to PT. Balance is better.    History: He was also evaluated for balance issues.This has been ongoing for several years.He denies numbness in the feet or weakness in the legs.He tore his right achilles heel at that time and had right foot weakness, which improved.NCV-EMG from 03/17/13 showed evidence of active and chronic right L5 radiculopathy but no evidence of polyneuropathy.MRI of lumbar spine from 03/20/13 showed multi-level disc bulging and facet hypertrophy but no spinal stenosis or significant foraminal narrowing or cause for L5 radiculopathy.Neuropathy labs were performed on 01/13/14, revealing small (0.3) M-spike. ANA, Sed Rate, RF, TSH, Hgb A1c, vitamin D 25-hydroxy, and B12 were unremarkable.Repeat testing of SPEP/IFE from December 2015 again showed an M-Spike with abnormal IgA lambda, consistent with MGUS.He was referred to Dr. Alvy Bimler of Heme/Onc.It was recommended to monitor for now. He continues to have non-radiating back pain with numbness in the legs with prolonged standing, unchanged. A NCV-EMG performed on 01/14/15 showed chronic right L3-L5 radiculopathy but no evidence of generalized sensorimotor polyneuropathy.To further evaluate gait instability, an MRI of the cervical spine was performed on 01/27/15, which did not reveal spinal stenosis. MRI of lumbar spine is reportedly unchanged. There is nothing surgical as per Dr. Vertell Limber. He receives epidural injections by Dr. Maryjean Ka, which are only modestly effective. Gabapentin helpful.Tramadol and Celebrex are ineffective.   MILD COGNITIVE IMPAIRMENT: Update:  Stable  History: He has noticed some memory difficulties since 2014. Specifically, he notes some problems recalling names of casual acquaintances (people he has only met maybe a couple of times). Sometimes, when he is driving on familiar routes to places he frequents, he needs to take a moment to re-orient himself. This occurs with places such as restaurants, but not places he goes to often, such as the grocery store. He denies problems with word-finding or remembering to take medication. He manages his finances without difficulty. He does not repeat questions. He does not exhibit REM sleep behavior disorder. He has not had any personality or behavioral changes. He denies hallucinations. There is no known family history of dementia or cognitive impairment.He has a Brewing technologist.  Aricept was discontinued due to dizziness.    Observations/Objective: MoCA-Blind: Memory: 1st trial: 4/5   2nd trial:4/5  Attention:  List of digits: 2/2  List of letters: 1/1  Serial 7's: 3/3  Language:  Repetition: 2/2  Fluency: 1/1  Abstraction: 2/2  Delayed recall: 3/5  Orientation: 6/6   TOTAL: 20/22 (Pt has a master's degree so no point added)   Assessment and Plan:   1.  Episodic tension-type headache, not intractable 2.  Remote hemorrhagic stroke causing fourth nerve palsy, likely secondary to hypertension, resolved 3.  Mild cognitive impairment 4.  Paroxysmal atrial fibrillation 5.  Chronic low back pain.  1.  For preventative management, start topiramate '25mg'$  at bedtime. 2.  For abortive therapy, Advil or may use Fioricet 3.  Limit use of pain relievers to no more than 2 days out of week to prevent risk of rebound or medication-overuse headache. 4.  Keep headache diary  5.  Exercise, hydration, caffeine cessation, sleep hygiene, monitor for and avoid triggers 6.  Consider:  magnesium citrate 429m daily, riboflavin 4076mdaily, and coenzyme Q10 10048mhree times daily 7.  Always keep in mind that currently taking a hormone or birth control may be a possible trigger or aggravating factor for migraine. 8. Follow up 5 months  Follow Up Instructions:    -I discussed the assessment and treatment plan with the patient. The patient was provided an opportunity to ask questions and all were answered. The patient agreed with the plan and demonstrated an understanding of the instructions.   The patient was advised to call back or seek an in-person evaluation if the symptoms worsen or if the condition fails to improve as anticipated.    Total Time spent in visit with the patient was:  15 minutes   AdaDudley MajorO

## 2019-05-22 ENCOUNTER — Telehealth (INDEPENDENT_AMBULATORY_CARE_PROVIDER_SITE_OTHER): Payer: Medicare Other | Admitting: Neurology

## 2019-05-22 ENCOUNTER — Encounter: Payer: Self-pay | Admitting: Neurology

## 2019-05-22 VITALS — Ht 70.0 in | Wt 168.0 lb

## 2019-05-22 DIAGNOSIS — I619 Nontraumatic intracerebral hemorrhage, unspecified: Secondary | ICD-10-CM | POA: Diagnosis not present

## 2019-05-22 DIAGNOSIS — G44219 Episodic tension-type headache, not intractable: Secondary | ICD-10-CM | POA: Diagnosis not present

## 2019-05-22 DIAGNOSIS — G8929 Other chronic pain: Secondary | ICD-10-CM

## 2019-05-22 DIAGNOSIS — I10 Essential (primary) hypertension: Secondary | ICD-10-CM | POA: Diagnosis not present

## 2019-05-22 DIAGNOSIS — G3184 Mild cognitive impairment, so stated: Secondary | ICD-10-CM

## 2019-05-22 DIAGNOSIS — M545 Low back pain, unspecified: Secondary | ICD-10-CM

## 2019-05-22 MED ORDER — TOPIRAMATE 25 MG PO TABS: 25.0000 mg | ORAL_TABLET | Freq: Every day | ORAL | 3 refills | Status: DC

## 2019-06-02 ENCOUNTER — Telehealth: Payer: Self-pay | Admitting: Neurology

## 2019-06-02 NOTE — Telephone Encounter (Signed)
New medication: Topiramate he said that he stopped taking it - overall fatigue, aching feeling- no energy. Worse than the headaches he was dealing with. He said he would call back if he wanted to be on anything else. He just wanted you to know he stopped taking it. He would call back if the headaches were bad to where he needs medication. Thanks!

## 2019-06-21 ENCOUNTER — Other Ambulatory Visit: Payer: Self-pay | Admitting: Neurology

## 2019-07-15 ENCOUNTER — Telehealth: Payer: Self-pay | Admitting: Cardiology

## 2019-07-15 NOTE — Telephone Encounter (Signed)
New message:     Patient calling because he went to his primary care doctor they stated to him that he needed to contact the doctor his BP needs to be checked, also patient states that he is not feeling well. Patient has a appt comeing up this month Please call patient.

## 2019-07-15 NOTE — Telephone Encounter (Signed)
I think it is fine for him to see Brian Hoover for the evaluation August 21.  I do not think that he needs any testing prior to this appointment.  Candee Furbish, MD

## 2019-07-15 NOTE — Telephone Encounter (Signed)
Mr. Radell reports he saw his PCP recently and was told his HR wasn't increasing like it should.  He states he walked with a pulse ox around the office and was told to call cardiology. He also states he gets very fatigued with any exertion.  He denies CP, dizziness, lightheadedness, SOB. He does not remember specific vital signs from the visit, but states he checks his BP and HR regularly at home. His HR is usually in the 50s and BP 428J systolic (he does not remember diastolic).  He has an appointment with Chanetta Marshall 8/21 and would like to know if any testing should be done prior to [place].  He is concerned primarily for blockage.  He is asymptomatic currently. He understands he will be called with Dr. [name]' recommendations.   PCP OV notes requested.

## 2019-07-15 NOTE — Telephone Encounter (Signed)
Informed the patient it is OK to wait for evaluation with Amber prior to ordering testing. He was grateful for follow-up.

## 2019-07-17 ENCOUNTER — Ambulatory Visit: Payer: Medicare Other | Admitting: Neurology

## 2019-07-24 NOTE — Progress Notes (Signed)
Electrophysiology Office Note Date: 07/25/2019  ID:  Brian Hoover, DOB Feb 18, 1932, MRN [id number]  PCP: [name], MD Primary Cardiologist: [name] Electrophysiologist: [name]  CC: AF follow up  Brian Hoover is a 85 y.o. male seen today for Dr [name].  He presents today for routine electrophysiology followup.  Since last being seen in our clinic, the patient reports doing relatively well.  He has noticed that for the last several months, he has had progressive exercise intolerance. He feels that with walking long distances, "something is not right in his chest".  He denies chest pain or shortness of breath but feels like he "can not go".  He was seen by his PCP who walked him around without significant increase in heart rate.   He denies chest pain, palpitations, dyspnea, PND, orthopnea, nausea, vomiting, dizziness, syncope, edema, weight gain, or early satiety.  Past Medical History:  Diagnosis Date  . Atrial fibrillation ([place])   . Back pain   . Bradycardia   . CHF (congestive heart failure) ([place])   . Chronic systolic heart failure ([place])   . COPD (chronic obstructive pulmonary disease) ([place])   . GERD (gastroesophageal reflux disease)   . Hypertension   . MGUS (monoclonal gammopathy of unknown significance) 01/25/2015  . Migraine   . Pneumothorax 01/28/2013  . PVC (premature ventricular contraction)   . Syncope    s/p Medtronic ILR implant 05/2013 by Dr [name]  . Trifascicular block    Past Surgical History:  Procedure Laterality Date  . BIOPSY  01/31/2019   Procedure: BIOPSY;  Surgeon: [name], MD;  Location: [facility];  Service: Endoscopy;;  . CHEST TUBE INSERTION Right 01/29/2013  . CLAVICLE SURGERY    . COLONOSCOPY WITH PROPOFOL N/A 01/31/2019   Procedure: COLONOSCOPY WITH PROPOFOL;  Surgeon: [name], MD;  Location: [facility];  Service: Endoscopy;  Laterality: N/A;  . LOOP RECORDER IMPLANT  06/02/2013   Medtronic LinQ implanted for syncope by Dr  [name]  . LOOP RECORDER IMPLANT N/A 06/02/2013   Procedure: LOOP RECORDER IMPLANT;  Surgeon: [name], MD;  Location: [facility] CATH LAB;  Service: Cardiovascular;  Laterality: N/A;  . PROSTATE SURGERY    . SHOULDER ARTHROSCOPY    . TENDON REPAIR     right foot    Current Outpatient Medications  Medication Sig Dispense Refill  . acetaminophen (TYLENOL) 325 MG tablet Take 650 mg by mouth every 6 (six) hours as needed.    Marland Kitchen aspirin 325 MG tablet Take 975 mg by mouth 2 (two) times daily as needed (bad headaches.). For headaches     . butalbital-acetaminophen-caffeine (FIORICET, ESGIC) 50-325-40 MG tablet Take 1 tablet by mouth every 6 (six) hours as needed for headache. 10 tablet 3  . carvedilol (COREG) 3.125 MG tablet TAKE 1 TABLET BY MOUTH TWICE A DAY WITH A MEAL 180 tablet 3  . dofetilide (TIKOSYN) 250 MCG capsule Take 1 capsule (250 mcg total) by mouth 2 (two) times daily. 180 capsule 3  . gabapentin (NEURONTIN) 300 MG capsule Take 600 mg by mouth 3 (three) times daily.    Marland Kitchen lisinopril (PRINIVIL,ZESTRIL) 40 MG tablet TAKE 1 TABLET BY MOUTH EVERY DAY 90 tablet 3  . Probiotic Product (ALIGN PO) Use as directed     No current facility-administered medications for this visit.     Allergies:   Sulfa antibiotics   Social History: Social History   Socioeconomic History  . Marital status: Married  Spouse name: [name]  . Number of children: 2  . Years of education: MA  . Highest education level: Not on file  Occupational History  . Occupation: retired Glass blower/designer  . Financial resource strain: Not on file  . Food insecurity    Worry: Not on file    Inability: Not on file  . Transportation needs    Medical: Not on file    Non-medical: Not on file  Tobacco Use  . Smoking status: Former Smoker    Packs/day: 1.50    Years: 10.00    Pack years: 15.00    Types: Cigarettes    Quit date: 12/04/1960    Years since quitting: 58.6  . Smokeless tobacco: Never Used  Substance  and Sexual Activity  . Alcohol use: Yes    Alcohol/week: 10.0 standard drinks    Types: 10 Shots of liquor per week    Comment: 4 Drinks a week  . Drug use: No  . Sexual activity: Yes    Partners: Female    Birth control/protection: None  Lifestyle  . Physical activity    Days per week: Not on file    Minutes per session: Not on file  . Stress: Not on file  Relationships  . Social Herbalist on phone: Not on file    Gets together: Not on file    Attends religious service: Not on file    Active member of club or organization: Not on file    Attends meetings of clubs or organizations: Not on file    Relationship status: Not on file  . Intimate partner violence    Fear of current or ex partner: Not on file    Emotionally abused: Not on file    Physically abused: Not on file    Forced sexual activity: Not on file  Other Topics Concern  . Not on file  Social History Narrative      Pt lives at home with his spouse.   Caffeine Use: 2 cups daily.    Family History: Family History  Problem Relation Age of Onset  . Breast cancer Mother   . Uterine cancer Mother   . Rheum arthritis Son     Review of Systems: All other systems reviewed and are otherwise negative except as noted above.   Physical Exam: VS:  BP 122/68   Pulse (!) 53   Ht 5\' 10"  (1.778 m)   Wt 167 lb 12.8 oz (76.1 kg)   SpO2 98%   BMI 24.08 kg/m  , BMI Body mass index is 24.08 kg/m. Wt Readings from Last 3 Encounters:  07/25/19 167 lb 12.8 oz (76.1 kg)  05/21/19 168 lb (76.2 kg)  01/31/19 165 lb (74.8 kg)    GEN- The patient is well appearing, alert and oriented x 3 today.   HEENT: normocephalic, atraumatic; sclera clear, conjunctiva pink; hearing intact; oropharynx clear; neck supple  Lungs- Clear to ausculation bilaterally, normal work of breathing.  No wheezes, rales, rhonchi Heart- Regular rate and rhythm  GI- soft, non-tender, non-distended, bowel sounds present  Extremities- no  clubbing, cyanosis, or edema  MS- no significant deformity or atrophy Skin- warm and dry, no rash or lesion  Psych- euthymic mood, full affect Neuro- strength and sensation are intact   EKG:  EKG is ordered today. The ekg ordered today shows sinus rhythm, rate 53, RBBB  Recent Labs: No results found for requested labs within last 8760 hours.  Other studies Reviewed: Additional studies/ records that were reviewed today include: Dr [name] and Dr Kingsley Plan office notes  Assessment and Plan:  1.  Paroxysmal atrial fibrillation Maintaining SR by symptoms Continue Tikosyn QTc stable today CHADS2VASC is 3 - not a candidate for [facility] with frequent falls and prior ICH  2.  Sinus node dysfunction/Advanced conduction system disease No syncope/pre-syncope He does have new exercise intolerance. Discussed with Dr [name] today - will plan holter monitor with follow up to evaluate bradycardia as cause of symptoms. ER precautions reviewed Labs done by PCP last week.    Current medicines are reviewed at length with the patient today.   The patient does not have concerns regarding his medicines.  The following changes were made today:  none  Labs/ tests ordered today include:   Orders Placed This Encounter  Procedures  . LONG TERM MONITOR (3-14 DAYS)  . EKG 12-Lead     Disposition:   Follow up with Dr [name] in 3-4 weeks   Signed, Chanetta Marshall, NP 07/25/2019 9:34 AM   [facility] [address] [place]  [address] [phone] (office) [phone] (fax)

## 2019-07-25 ENCOUNTER — Ambulatory Visit (INDEPENDENT_AMBULATORY_CARE_PROVIDER_SITE_OTHER): Payer: Medicare Other | Admitting: Nurse Practitioner

## 2019-07-25 ENCOUNTER — Encounter: Payer: Self-pay | Admitting: Nurse Practitioner

## 2019-07-25 ENCOUNTER — Telehealth: Payer: Self-pay | Admitting: Radiology

## 2019-07-25 ENCOUNTER — Other Ambulatory Visit: Payer: Self-pay

## 2019-07-25 ENCOUNTER — Encounter

## 2019-07-25 VITALS — BP 122/68 | HR 53 | Ht 70.0 in | Wt 167.8 lb

## 2019-07-25 DIAGNOSIS — I480 Paroxysmal atrial fibrillation: Secondary | ICD-10-CM | POA: Diagnosis not present

## 2019-07-25 DIAGNOSIS — I453 Trifascicular block: Secondary | ICD-10-CM

## 2019-07-25 DIAGNOSIS — I495 Sick sinus syndrome: Secondary | ICD-10-CM | POA: Diagnosis not present

## 2019-07-25 NOTE — Patient Instructions (Addendum)
Medication Instructions:  none If you need a refill on your cardiac medications before your next appointment, please call your pharmacy.   Lab work: none If you have labs (blood work) drawn today and your tests are completely normal, you will receive your results only by: Marland Kitchen MyChart Message (if you have MyChart) OR . A paper copy in the mail If you have any lab test that is abnormal or we need to change your treatment, we will call you to review the results.  Testing/Procedures:  Someone will contact you ZIO Heart monitor 72 hours  Follow-Up:  Keep appointment with Dr [name] 9/8 At [facility], you and your health needs are our priority.  As part of our continuing mission to provide you with exceptional heart care, we have created designated Provider Care Teams.  These Care Teams include your primary Cardiologist (physician) and Advanced Practice Providers (APPs -  Physician Assistants and Nurse Practitioners) who all work together to provide you with the care you need, when you need it. .   Any Other Special Instructions Will Be Listed Below (If Applicable).

## 2019-07-25 NOTE — Telephone Encounter (Signed)
Enrolled patient for a 3 day Zio monitor to be mailed. Brief instructions were gone over with patient and he knows to expect the monitor to arrive in 3-4 days 

## 2019-07-30 ENCOUNTER — Ambulatory Visit (INDEPENDENT_AMBULATORY_CARE_PROVIDER_SITE_OTHER): Payer: Medicare Other

## 2019-07-30 DIAGNOSIS — I495 Sick sinus syndrome: Secondary | ICD-10-CM

## 2019-07-30 DIAGNOSIS — I453 Trifascicular block: Secondary | ICD-10-CM | POA: Diagnosis not present

## 2019-07-30 DIAGNOSIS — I480 Paroxysmal atrial fibrillation: Secondary | ICD-10-CM

## 2019-07-31 ENCOUNTER — Telehealth: Payer: Self-pay | Admitting: Nurse Practitioner

## 2019-07-31 NOTE — Telephone Encounter (Signed)
I spoke to the patient and answered his questions about the monitor.  He verbalized understanding.

## 2019-07-31 NOTE — Telephone Encounter (Signed)
New Message   Patient is calling because he has some questions in reference to wearing the holter monitor. He wants to know how long he has to wear it. He also states that when he initially placed the monitor on he had some back pains but he thinks that may have to do with some medication he was given. Please call.

## 2019-08-04 ENCOUNTER — Telehealth: Payer: Self-pay | Admitting: Neurology

## 2019-08-04 NOTE — Telephone Encounter (Signed)
Called patient he was informed of this

## 2019-08-04 NOTE — Telephone Encounter (Signed)
Yes.  This should be addressed to his PCP.  It is an acute on chronic issue

## 2019-08-04 NOTE — Telephone Encounter (Signed)
Defer to PCP

## 2019-08-04 NOTE — Telephone Encounter (Signed)
Patient is having severe back pain and is wanting to talk with Dr. Tomi Likens about this.

## 2019-08-08 ENCOUNTER — Other Ambulatory Visit: Payer: Self-pay | Admitting: Internal Medicine

## 2019-08-08 ENCOUNTER — Telehealth: Payer: Self-pay | Admitting: Cardiology

## 2019-08-08 NOTE — Telephone Encounter (Signed)
New Message   Dr. Leonides Schanz is calling on behalf of patient that has an appt 09/08 she is wondering if Dr. [name] will consider a med change. He has persistent hyponatremia sodium 128-130 no change with water restriction eliminating alcohol. She would like him to consider stopping lisinopril. Would amlodopine be a good alternative?

## 2019-08-12 ENCOUNTER — Encounter: Payer: Self-pay | Admitting: Internal Medicine

## 2019-08-12 ENCOUNTER — Ambulatory Visit (INDEPENDENT_AMBULATORY_CARE_PROVIDER_SITE_OTHER): Payer: Medicare Other | Admitting: Internal Medicine

## 2019-08-12 ENCOUNTER — Other Ambulatory Visit: Payer: Self-pay

## 2019-08-12 VITALS — BP 128/80 | HR 56 | Ht 70.0 in | Wt 163.0 lb

## 2019-08-12 DIAGNOSIS — I493 Ventricular premature depolarization: Secondary | ICD-10-CM | POA: Diagnosis not present

## 2019-08-12 MED ORDER — LISINOPRIL 20 MG PO TABS: 20.0000 mg | ORAL_TABLET | Freq: Every day | ORAL | 3 refills | Status: AC

## 2019-08-12 NOTE — Patient Instructions (Signed)
Medication Instructions:  Decrease Lisinopril to 20 mg Daily If you need a refill on your cardiac medications before your next appointment, please call your pharmacy.   Lab work: none If you have labs (blood work) drawn today and your tests are completely normal, you will receive your results only by: Marland Kitchen MyChart Message (if you have MyChart) OR . A paper copy in the mail If you have any lab test that is abnormal or we need to change your treatment, we will call you to review the results.  Testing/Procedures: none  Follow-Up: Dr [name] 3 months At [facility], you and your health needs are our priority.  As part of our continuing mission to provide you with exceptional heart care, we have created designated Provider Care Teams.  These Care Teams include your primary Cardiologist (physician) and Advanced Practice Providers (APPs -  Physician Assistants and Nurse Practitioners) who all work together to provide you with the care you need, when you need it. You will need a follow up appointment in 3 months.  Please call our office 2 months in advance to schedule this appointment.  You may see  or one of the following Advanced Practice Providers on your designated Care Team:   Chanetta Marshall, NP . Tommye Standard, PA-C  Any Other Special Instructions Will Be Listed Below (If Applicable).

## 2019-08-12 NOTE — Progress Notes (Signed)
HPI [name] returns today for ongoing evaluation and management of his PAF, palpitations, PVC's, and sinus node dysfunction. He has been found to have hyponatremia which has resolved mostly since he fluid restricted. Unfortunately he developed hyperkalemia for which he was thought to be dehydrated. This has improved. He had what he thinks was symptomatic brady due to chronotropic incompetence several weeks ago. The symptoms have resolved. He denies syncope.  Allergies  Allergen Reactions  . Sulfa Antibiotics Hives          Current Outpatient Medications  Medication Sig Dispense Refill  . acetaminophen (TYLENOL) 325 MG tablet Take 650 mg by mouth every 6 (six) hours as needed.    Marland Kitchen aspirin 325 MG tablet Take 975 mg by mouth 2 (two) times daily as needed (bad headaches.). For headaches     . butalbital-acetaminophen-caffeine (FIORICET, ESGIC) 50-325-40 MG tablet Take 1 tablet by mouth every 6 (six) hours as needed for headache. 10 tablet 3  . carvedilol (COREG) 3.125 MG tablet TAKE 1 TABLET BY MOUTH TWICE A DAY WITH A MEAL 180 tablet 3  . dofetilide (TIKOSYN) 250 MCG capsule TAKE 1 CAPSULE (250 MCG TOTAL) BY MOUTH 2 (TWO) TIMES DAILY. 180 capsule 0  . gabapentin (NEURONTIN) 300 MG capsule Take 600 mg by mouth 3 (three) times daily.    Marland Kitchen lisinopril (PRINIVIL,ZESTRIL) 40 MG tablet TAKE 1 TABLET BY MOUTH EVERY DAY 90 tablet 3  . methocarbamol (ROBAXIN) 500 MG tablet Take 500 mg by mouth as needed for muscle spasms.    . Morphine Sulfate ER 15 MG TBEA Take 1 tablet by mouth as needed (pain).    . Probiotic Product (ALIGN PO) Use as directed     No current facility-administered medications for this visit.      Past Medical History:  Diagnosis Date  . Atrial fibrillation ([place])   . Back pain   . Bradycardia   . CHF (congestive heart failure) ([place])   . Chronic systolic heart failure ([place])   . COPD (chronic obstructive pulmonary disease) ([place])   . GERD (gastroesophageal reflux  disease)   . Hypertension   . MGUS (monoclonal gammopathy of unknown significance) 01/25/2015  . Migraine   . Pneumothorax 01/28/2013  . PVC (premature ventricular contraction)   . Syncope    s/p Medtronic ILR implant 05/2013 by Dr [name]  . Trifascicular block     ROS:   All systems reviewed and negative except as noted in the HPI.   Past Surgical History:  Procedure Laterality Date  . BIOPSY  01/31/2019   Procedure: BIOPSY;  Surgeon: [name], MD;  Location: [facility];  Service: Endoscopy;;  . CHEST TUBE INSERTION Right 01/29/2013  . CLAVICLE SURGERY    . COLONOSCOPY WITH PROPOFOL N/A 01/31/2019   Procedure: COLONOSCOPY WITH PROPOFOL;  Surgeon: [name], MD;  Location: [facility];  Service: Endoscopy;  Laterality: N/A;  . LOOP RECORDER IMPLANT  06/02/2013   Medtronic LinQ implanted for syncope by Dr [name]  . LOOP RECORDER IMPLANT N/A 06/02/2013   Procedure: LOOP RECORDER IMPLANT;  Surgeon: [name], MD;  Location: [facility] CATH LAB;  Service: Cardiovascular;  Laterality: N/A;  . PROSTATE SURGERY    . SHOULDER ARTHROSCOPY    . TENDON REPAIR     right foot     Family History  Problem Relation Age of Onset  . Breast cancer Mother   . Uterine cancer Mother   . Rheum arthritis Son  Social History   Socioeconomic History  . Marital status: Married    Spouse name: [name]  . Number of children: 2  . Years of education: MA  . Highest education level: Not on file  Occupational History  . Occupation: retired Glass blower/designer  . Financial resource strain: Not on file  . Food insecurity    Worry: Not on file    Inability: Not on file  . Transportation needs    Medical: Not on file    Non-medical: Not on file  Tobacco Use  . Smoking status: Former Smoker    Packs/day: 1.50    Years: 10.00    Pack years: 15.00    Types: Cigarettes    Quit date: 12/04/1960    Years since quitting: 58.7  . Smokeless tobacco: Never Used  Substance and Sexual  Activity  . Alcohol use: Yes    Alcohol/week: 10.0 standard drinks    Types: 10 Shots of liquor per week    Comment: 4 Drinks a week  . Drug use: No  . Sexual activity: Yes    Partners: Female    Birth control/protection: None  Lifestyle  . Physical activity    Days per week: Not on file    Minutes per session: Not on file  . Stress: Not on file  Relationships  . Social Herbalist on phone: Not on file    Gets together: Not on file    Attends religious service: Not on file    Active member of club or organization: Not on file    Attends meetings of clubs or organizations: Not on file    Relationship status: Not on file  . Intimate partner violence    Fear of current or ex partner: Not on file    Emotionally abused: Not on file    Physically abused: Not on file    Forced sexual activity: Not on file  Other Topics Concern  . Not on file  Social History Narrative      Pt lives at home with his spouse.   Caffeine Use: 2 cups daily.     BP 128/80   Pulse (!) 56   Ht 5\' 10"  (1.778 m)   Wt 163 lb (73.9 kg)   SpO2 98%   BMI 23.39 kg/m   Physical Exam:  Well appearing 85 yo man NAD HEENT: Unremarkable Neck:  No JVD, no thyromegally Lymphatics:  No adenopathy Back:  No CVA tenderness Lungs:  Clear with no wheezes HEART:  Regular rate rhythm, no murmurs, no rubs, no clicks Abd:  soft, positive bowel sounds, no organomegally, no rebound, no guarding Ext:  2 plus pulses, no edema, no cyanosis, no clubbing Skin:  No rashes no nodules Neuro:  CN II through XII intact, motor grossly intact  EKG - sinus brady with IVCD  Assess/Plan: 1. PAF - he is maintaining NSR on dofetilide. He is not on a blood thinner due to bleeding. 2. Sinus node dysfunction - he appears to be improved. He wore a cardiac monitor but I have not seen the findings. No indication for a PPM at the present time. 3. PVC's - he is s/p remote ablation and his symptoms have resolved.  4. Syncope  - he has not had any additional episodes. We will try and correlate symptoms.   Mikle Bosworth.D.

## 2019-08-13 ENCOUNTER — Telehealth: Payer: Self-pay | Admitting: Cardiology

## 2019-08-13 NOTE — Telephone Encounter (Signed)
Dr. [name] made recent adjustment in blood pressure medication in response to request from Dr. Willa Frater phone note from 08/08/2019  Dr. [name] is primary cardiologist.  Will forward to Dr. [name] and his covering for further recommendations regarding blood pressure management.

## 2019-08-13 NOTE — Telephone Encounter (Signed)
New Message   Patients wife is calling in reference to husbands BP. She does not recall what the number was, but she states that the BP continues to go up and down and she is concerned. She also wants to check on the monitor results as well. Please call.

## 2019-08-13 NOTE — Telephone Encounter (Signed)
I spoke to the patient who would like an appointment with Dr [name] and review recent monitor results.

## 2019-08-18 ENCOUNTER — Encounter: Payer: Self-pay | Admitting: Cardiology

## 2019-08-18 ENCOUNTER — Ambulatory Visit (INDEPENDENT_AMBULATORY_CARE_PROVIDER_SITE_OTHER): Payer: Medicare Other | Admitting: Cardiology

## 2019-08-18 ENCOUNTER — Other Ambulatory Visit: Payer: Self-pay

## 2019-08-18 VITALS — BP 116/72 | HR 57 | Ht 70.0 in | Wt 165.6 lb

## 2019-08-18 DIAGNOSIS — I493 Ventricular premature depolarization: Secondary | ICD-10-CM

## 2019-08-18 DIAGNOSIS — I480 Paroxysmal atrial fibrillation: Secondary | ICD-10-CM

## 2019-08-18 DIAGNOSIS — I5022 Chronic systolic (congestive) heart failure: Secondary | ICD-10-CM

## 2019-08-18 NOTE — Progress Notes (Signed)
Cardiology Office Note:    Date:  08/18/2019   ID:  Brian Hoover, DOB 1932-09-20, MRN [id number]  PCP:  [name], MD  Cardiologist:  Candee Furbish, MD   Referring MD: [name], MD     History of Present Illness:    Brian Hoover is a 85 y.o. male  with history of paroxysmal atrial fibrillation now on dofetilide 04/2018 (previously amio but worsening paraesthesia), sinus bradycardia, PVCs, frequent falls and history of cerebrovascular bleeding (not candidate for [facility]).   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  Pacemaker may be warranted in the future.   Back pain.  [place]. [place], per Dr. Cristopher Peru. Went into afib after moving. 1 hour out of rhythm but overall has been doing quite well.  Bradycardic at baseline.  No syncope.  Back pain still is his main issue.  08/18/2019-here for blood pressure and monitor follow-up.  He has had issues controlling his blood pressure.  Last seen by Dr. Cristopher Peru on 08/12/2019.  He had what he thought was symptomatic bradycardia due to chronotropic incompetence several weeks ago.  Symptoms resolved.  Dr. [name] stated that he was maintaining normal sinus rhythm on dofetilide.  Not on anticoagulation secondary to bleeding.  Sinus node dysfunction appeared improved.  No indication for pacemaker at this time.  Remote PVC ablation.  No syncope episodes.  Supposedly he wore a ZIO monitor according to telephone notes but I do not see any evidence of this being interpreted.  Looks like it was ordered on 07/25/2019 by [employer] for Dr. [name] to read.  We were able to locate this via Markus Daft and predominant rhythm is sinus rhythm with occasional runs of nonsustained ventricular tachycardia.  Also has 1 minute of ventricular bigeminy, 23 beats of what looks like paroxysmal atrial tachycardia.  No significant atrial fibrillation.  Blood pressure today was 116/72.  On low-dose carvedilol 3.125 and  lisinopril 20.  Resting HR 90's with pulse ox at home.    Past Medical History:  Diagnosis Date  . Atrial fibrillation ([place])   . Back pain   . Bradycardia   . CHF (congestive heart failure) ([place])   . Chronic systolic heart failure ([place])   . COPD (chronic obstructive pulmonary disease) ([place])   . GERD (gastroesophageal reflux disease)   . Hypertension   . MGUS (monoclonal gammopathy of unknown significance) 01/25/2015  . Migraine   . Pneumothorax 01/28/2013  . PVC (premature ventricular contraction)   . Syncope    s/p Medtronic ILR implant 05/2013 by Dr [name]  . Trifascicular block     Past Surgical History:  Procedure Laterality Date  . BIOPSY  01/31/2019   Procedure: BIOPSY;  Surgeon: [name], MD;  Location: [facility] ENDOSCOPY;  Service: Endoscopy;;  . CHEST TUBE INSERTION Right 01/29/2013  . CLAVICLE SURGERY    . COLONOSCOPY WITH PROPOFOL N/A 01/31/2019   Procedure: COLONOSCOPY WITH PROPOFOL;  Surgeon: [name], MD;  Location: [facility] ENDOSCOPY;  Service: Endoscopy;  Laterality: N/A;  . LOOP RECORDER IMPLANT  06/02/2013   Medtronic LinQ implanted for syncope by Dr [name]  . LOOP RECORDER IMPLANT N/A 06/02/2013   Procedure: LOOP RECORDER IMPLANT;  Surgeon: [name], MD;  Location: [facility] CATH LAB;  Service: Cardiovascular;  Laterality: N/A;  . PROSTATE SURGERY    . SHOULDER ARTHROSCOPY    . TENDON REPAIR     right foot    Current Medications: Current Meds  Medication Sig  . acetaminophen (TYLENOL) 325 MG tablet Take 650 mg by mouth every 6 (six) hours as needed.  Marland Kitchen aspirin 325 MG tablet Take 975 mg by mouth 2 (two) times daily as needed (bad headaches.). For headaches   . butalbital-acetaminophen-caffeine (FIORICET, ESGIC) 50-325-40 MG tablet Take 1 tablet by mouth every 6 (six) hours as needed for headache.  . carvedilol (COREG) 3.125 MG tablet TAKE 1 TABLET BY MOUTH TWICE A DAY WITH A MEAL  . dofetilide (TIKOSYN) 250 MCG capsule TAKE 1 CAPSULE (250 MCG TOTAL) BY MOUTH  2 (TWO) TIMES DAILY.  Marland Kitchen gabapentin (NEURONTIN) 300 MG capsule Take 600 mg by mouth 3 (three) times daily.  Marland Kitchen lisinopril (ZESTRIL) 20 MG tablet Take 1 tablet (20 mg total) by mouth daily.  . methocarbamol (ROBAXIN) 500 MG tablet Take 500 mg by mouth as needed for muscle spasms.  . Morphine Sulfate ER 15 MG TBEA Take 1 tablet by mouth as needed (pain).  . Probiotic Product (ALIGN PO) Use as directed     Allergies:   Sulfa antibiotics   Social History   Socioeconomic History  . Marital status: Married    Spouse name: [name]  . Number of children: 2  . Years of education: MA  . Highest education level: Not on file  Occupational History  . Occupation: retired Glass blower/designer  . Financial resource strain: Not on file  . Food insecurity    Worry: Not on file    Inability: Not on file  . Transportation needs    Medical: Not on file    Non-medical: Not on file  Tobacco Use  . Smoking status: Former Smoker    Packs/day: 1.50    Years: 10.00    Pack years: 15.00    Types: Cigarettes    Quit date: 12/04/1960    Years since quitting: 58.7  . Smokeless tobacco: Never Used  Substance and Sexual Activity  . Alcohol use: Yes    Alcohol/week: 10.0 standard drinks    Types: 10 Shots of liquor per week    Comment: 4 Drinks a week  . Drug use: No  . Sexual activity: Yes    Partners: Female    Birth control/protection: None  Lifestyle  . Physical activity    Days per week: Not on file    Minutes per session: Not on file  . Stress: Not on file  Relationships  . Social Herbalist on phone: Not on file    Gets together: Not on file    Attends religious service: Not on file    Active member of club or organization: Not on file    Attends meetings of clubs or organizations: Not on file    Relationship status: Not on file  Other Topics Concern  . Not on file  Social History Narrative      Pt lives at home with his spouse.   Caffeine Use: 2 cups daily.     Family  History: The patient's family history includes Breast cancer in his mother; Rheum arthritis in his son; Uterine cancer in his mother.  ROS:   Please see the history of present illness.     All others neg  EKGs/Labs/Other Studies Reviewed:    The following studies were reviewed today:  ECHO: 09/16/17 - Left ventricle: The cavity size was normal. Wall thickness was   increased in a pattern of moderate LVH. Systolic function was   mildly to moderately reduced. The  estimated ejection fraction was   in the range of 40% to 45%. Inferior akinesis. The study is not   technically sufficient to allow evaluation of LV diastolic   function. - Aortic valve: Trileaflet. Sclerosis without stenosis. There was   no regurgitation. - Mitral valve: Calcified annulus. Mildly thickened leaflets .   There was trivial regurgitation. - Left atrium: The atrium was normal in size. - Tricuspid valve: There was trivial regurgitation. - Pulmonary arteries: PA peak pressure: 19 mm Hg (S). - Systemic veins: The IVC was not visualized.  Impressions:  - Compared to a prior echo in 06/2017, the LVEF is improved to   40-45% (from 35-40%) with inferior akinesis.  EKG:  None today  Recent Labs: No results found for requested labs within last 8760 hours.  Recent Lipid Panel    Component Value Date/Time   CHOL 172 09/16/2017 0334   TRIG 75 09/16/2017 0334   HDL 64 09/16/2017 0334   CHOLHDL 2.7 09/16/2017 0334   VLDL 15 09/16/2017 0334   LDLCALC 93 09/16/2017 0334    Physical Exam:    VS:  BP 116/72   Pulse (!) 57   Ht 5\' 10"  (1.778 m)   Wt 165 lb 9.6 oz (75.1 kg)   SpO2 98%   BMI 23.76 kg/m     Wt Readings from Last 3 Encounters:  08/18/19 165 lb 9.6 oz (75.1 kg)  08/12/19 163 lb (73.9 kg)  07/25/19 167 lb 12.8 oz (76.1 kg)     GEN: Well nourished, well developed, in no acute distress  HEENT: normal  Neck: no JVD, carotid bruits, or masses Cardiac: RRR, occasional ectopy; no murmurs, rubs,  or gallops,no edema  Respiratory:  clear to auscultation bilaterally, normal work of breathing GI: soft, nontender, nondistended, + BS MS: no deformity or atrophy  Skin: warm and dry, no rash Neuro:  Alert and Oriented x 3, Strength and sensation are intact Psych: euthymic mood, full affect a little anxious    ASSESSMENT:    1. PVC's (premature ventricular contractions)   2. Paroxysmal atrial fibrillation (HCC)   3. Chronic systolic heart failure (HCC)    PLAN:    In order of problems listed above:  Paroxysmal atrial fibrillation -He is now on dofetilide.  Understands to avoid QT prolonging agents.  He is off of nortriptyline.  He was not felt to be an oral anticoagulation candidate based upon his prior hemorrhagic stroke however this was a very small region and his neurologist thought that this may be a reasonable thing for him to be on.  Even after discussion with Dr. Tomi Likens, Mr. Admire would still like to stay off of anticoagulation.  He needs to take his aspirin for his back pain he states.  He understands stroke risk.  No changes.  Monitor shows sinus rhythm.  Dr. [name] will report out monitor.  Nonischemic cardiomyopathy -EF 40-45%.  Now the PVCs have improved hopefully this has improved as well.  Overall no change.  Overall doing well without any significant shortness of breath.  Continue with low-dose carvedilol despite occasional bradycardia.  No changes made.  Paresthesias - He states that there was no real change with the gabapentin. No changes.  Main complaint still is back pain.  He takes aspirin for this.  No changes.  Hyponatremia  - stopped ETOH. Decreased lisinopril to 20mg .  Blood pressure today 116/72.  Dr. Leonides Schanz is monitoring sodium.  Generalized anxiety - Recommend speaking with Dr. Leonides Schanz about this.  Perhaps SSRI would be helpful.  I would have no problems with him taking this.  Back pain - Physical therapy sounds like a great idea.  Continue to follow with  Dr. Guido Sander advice.  We will see him back in 6 months  Medication Adjustments/Labs and Tests Ordered: Current medicines are reviewed at length with the patient today.  Concerns regarding medicines are outlined above.  No orders of the defined types were placed in this encounter.  No orders of the defined types were placed in this encounter.   Signed, Candee Furbish, MD  08/18/2019 10:43 AM    [facility]

## 2019-08-18 NOTE — Patient Instructions (Signed)
Your physician recommends that you continue on your current medications as directed. Please refer to the Current Medication list given to you today.  Your physician wants you to follow-up in: YEAR WITH DR SKAINS  You will receive a reminder letter in the mail two months in advance. If you don't receive a letter, please call our office to schedule the follow-up appointment.  

## 2019-08-19 ENCOUNTER — Ambulatory Visit: Payer: Medicare Other | Admitting: Cardiology

## 2019-08-29 ENCOUNTER — Ambulatory Visit: Payer: Medicare Other | Admitting: Internal Medicine

## 2019-09-02 ENCOUNTER — Other Ambulatory Visit: Payer: Self-pay | Admitting: Internal Medicine

## 2019-09-03 ENCOUNTER — Encounter: Payer: Self-pay | Admitting: Neurology

## 2019-09-03 NOTE — Progress Notes (Signed)
Virtual Visit via Telephone Note The purpose of this virtual visit is to provide medical care while limiting exposure to the novel coronavirus.    Consent was obtained for phone visit:  Yes.   Answered questions that patient had about telehealth interaction:  Yes.   I discussed the limitations, risks, security and privacy concerns of performing an evaluation and management service by telephone. I also discussed with the patient that there may be a patient responsible charge related to this service. The patient expressed understanding and agreed to proceed.  Pt location: Home Physician Location: Home Name of referring provider:  [name], MD I connected with .Brian Hoover at patients initiation/request on 09/04/2019 at  8:30 AM EDT by telephone and verified that I am speaking with the correct person using two identifiers.  Pt MRN:  [id number] Pt DOB:  Sep 26, 1932   History of Present Illness:  Brian Hoover is an 85 year old right-handed Caucasian man with paroxysmal atrial fibrillation,small hemorrhagic pontine stroke, MCI, cluster headache,COPD, HSV-2, CHF, GERD, chronic systolic heart failure, PVCs and history of pneumothorax who follows up for headaches.  HEADACHE: Update: He tried topiramate briefly but stopped because he felt fatigued and had an achy feeling that outweighed the headache.  Headaches are mild to moderate (not severe) daily but last several hours.  Sometimes wakes up with them.  Now notes some nausea.  No new visual disturbance, slurred speech or unilateral numbness or weakness.    Frequency of pain relievers:  Morphine (2-3 days a week for back pain); ASA 2 to 3 days a week; coffee daily Current NSAIDs: three ASA 375m with coffee helps Current analgesics:  Fioricet (has not filled it), morphine (for back pain) Current antihistamine: Benadryl 742m(to help with sleep, but still with problems) Current anticonvulsant: Gabapentin 60075mn AM/300m10mon/300mg58m PM/600mg 42mime  Drinks 2 mugs of coffee daily  History: He has history of cluster headaches for over 20 years as well as other headaches for many years. His cluster headaches are described as involving the right eye, 10/10 intensity, sharp stabbing pain, lasting one hour.It would occur between 1:30 and 2 am in the morning.He was previously treated with verapamil, which caused constipation.He had used O2 and nasal sprays.For many years, he has been taking Zyprexa.He takes it as needed, only when he feels he has a cluster headache.His last cluster headache was in 2012.  He also has other type of headache.It is bi-frontal, non-throbbing ache, about 6-7/10.It is not associated with other symptoms such as nausea or photophobia.It can last all day and typically occurs 4 times a month.Often, he wakes up with it.Sometimes, it would lead into a cluster, so he has taken Zyprexa recently for it, but it has not helped.He was advised to start taking the Zyprexa daily.He takes ASA or ibuprofen sparingly.They only take the edge off.Drinking two cups of coffee treats it well.  Past medications: Antidepressant:Nortriptyline(effective but it had prolonged his Q-T interval) Past antiepileptic:  topiramate 25mg (20mgue, achy feeling)  HEMORRHAGIC STROKE/CRANIAL FOURTH NERVE PALSY: Update: Stable.   History: He developed vertical diplopia in 2017, particularly noticeable when he was driving.He saw ophthalmologist, Dr. BradleyJola Schmidtiagnosed a fourth nerve palsy.MRI of the brain and orbits with and without contrast from 02/01/16 showed a focal area of remote hemorrhage just inferior to the third nerve nucleus and near the fourth nerve nucleus.He reportedly had fluctuations in blood pressure with systolics in the high 160s.  124PINESS/PRESYNCOPE: Update: He had increased dizziness  after increase in donepezil. We discontinued it to see if symptoms  improved.   History: He has had episodes of feeling "woozy" for several years.One time it occurred after a 3 mile walk and another time it occurred when he stood up while walking in his garden.He has not lost consciousness.There is no spinning sensation but he says that the "roomswims".MRI of the brain and MRA of head from 11/05/12 were unremarkable.MRA of neck showed no stenosis except for some smooth plaque at the origin of the right ICA.Orthostatic testing was reportedly negative.  GAIT INSTABILITY AND CHRONIC BACK PAIN: Update: He sits at choir. He believes his balance problems are due to the back pain. Takes gabapentin for pain. [facility]. Balance is better.  History: He was also evaluated for balance issues.This has been ongoing for several years.He denies numbness in the feet or weakness in the legs.He tore his right achilles heel at that time and had right foot weakness, which improved.NCV-EMG from 03/17/13 showed evidence of active and chronic right L5 radiculopathy but no evidence of polyneuropathy.MRI of lumbar spine from 03/20/13 showed multi-level disc bulging and facet hypertrophy but no spinal stenosis or significant foraminal narrowing or cause for L5 radiculopathy.Neuropathy labs were performed on 01/13/14, revealing small (0.3) M-spike. ANA, Sed Rate, RF, TSH, Hgb A1c, vitamin D 25-hydroxy, and B12 were unremarkable.Repeat testing of SPEP/IFE from December 2015 again showed an M-Spike with abnormal IgA lambda, consistent with MGUS.He was referred to Dr. Alvy Bimler of Heme/Onc.It was recommended to monitor for now. He continues to have non-radiating back pain with numbness in the legs with prolonged standing, unchanged. A NCV-EMG performed on 01/14/15 showed chronic right L3-L5 radiculopathy but no evidence of generalized sensorimotor polyneuropathy.To further evaluate gait instability, an MRI of the cervical spine was performed on 01/27/15, which  did not reveal spinal stenosis. MRI of lumbar spine is reportedly unchanged. There is nothing surgical as per Dr. Vertell Limber. He receives epidural injections by Dr. Maryjean Ka, which are only modestly effective. Gabapentin helpful.Tramadol and Celebrex are ineffective.  MILD COGNITIVE IMPAIRMENT: Update: Stable  History: He has noticed some memory difficultiessince 2014. Specifically, he notes some problems recalling names of casual acquaintances (people he has only met maybe a couple of times). Sometimes, when he is driving on familiar routes to places he frequents, he needs to take a moment to re-orient himself. This occurs with places such as restaurants, but not places he goes to often, such as the grocery store. He denies problems with word-finding or remembering to take medication. He manages his finances without difficulty. He does not repeat questions. He does not exhibit REM sleep behavior disorder. He has not had any personality or behavioral changes. He denies hallucinations. There is no known family history of dementia or cognitive impairment.He has a Brewing technologist.Aricept was discontinued due to dizziness.     Observations/Objective:   Height 5' 10"  (1.778 m), weight 160 lb (72.6 kg).  No acute distress.  Alert and oriented.  Speech fluent and not dysarthric.  Language intact.   Assessment and Plan:   1.  Tension-type headaches, now daily.  Reports increased stress and complicated by rebound (daily coffee intake) 2.  Remote hemorrhagic stroke causing fourth nerve palsy, likely secondary to hypertension, resolved 3.  Chronic back pain   1.  Start Depakote ER 242m at bedtime.  Will obtain recent labs (CBC, CMP) from PCP.  If headaches not improved in 6 weeks, he is to contact me and we can increase dose to 501mat bedtime  2.  Advised to stop caffeine intake 3.  For abortive therapy, advised to limit ASA to no more than 2 pills and to take no more than 2 days out of  the week.  Limit use of pain relievers to no more than 2 days out of week to prevent risk of rebound or medication-overuse headache. 4.  Refill gabapentin. 5.  Keep headache diary 6.  Follow up in 4 months   Follow Up Instructions:    -I discussed the assessment and treatment plan with the patient. The patient was provided an opportunity to ask questions and all were answered. The patient agreed with the plan and demonstrated an understanding of the instructions.   The patient was advised to call back or seek an in-person evaluation if the symptoms worsen or if the condition fails to improve as anticipated.    Total Time spent in visit with the patient was:  15 minutes  Dudley Major, DO

## 2019-09-04 ENCOUNTER — Telehealth (INDEPENDENT_AMBULATORY_CARE_PROVIDER_SITE_OTHER): Payer: Medicare Other | Admitting: Neurology

## 2019-09-04 ENCOUNTER — Encounter: Payer: Self-pay | Admitting: Neurology

## 2019-09-04 ENCOUNTER — Other Ambulatory Visit: Payer: Self-pay

## 2019-09-04 VITALS — Ht 70.0 in | Wt 160.0 lb

## 2019-09-04 DIAGNOSIS — G44229 Chronic tension-type headache, not intractable: Secondary | ICD-10-CM | POA: Diagnosis not present

## 2019-09-04 DIAGNOSIS — G8929 Other chronic pain: Secondary | ICD-10-CM

## 2019-09-04 MED ORDER — DIVALPROEX SODIUM ER 250 MG PO TB24: 250.0000 mg | ORAL_TABLET | Freq: Every day | ORAL | 3 refills | Status: DC

## 2019-09-04 MED ORDER — GABAPENTIN 300 MG PO CAPS: ORAL_CAPSULE | ORAL | 5 refills | Status: DC

## 2019-09-04 NOTE — Patient Instructions (Signed)
1.  Will start depakote ER 250mg  at bedtime.  Advised to contact me in 6 weeks if headaches not improved 2.  Stop coffee 3.  Take 2 aspirin for headache (not 3) and limit to no more than 2 days out of week 4.  Gabapentin refilled 5.  Follow up in 4 months

## 2019-09-08 ENCOUNTER — Telehealth: Payer: Self-pay | Admitting: Neurology

## 2019-09-08 DIAGNOSIS — G44229 Chronic tension-type headache, not intractable: Secondary | ICD-10-CM

## 2019-09-08 DIAGNOSIS — G44219 Episodic tension-type headache, not intractable: Secondary | ICD-10-CM

## 2019-09-08 DIAGNOSIS — G8929 Other chronic pain: Secondary | ICD-10-CM

## 2019-09-08 MED ORDER — VENLAFAXINE HCL ER 37.5 MG PO CP24: 37.5000 mg | ORAL_CAPSULE | Freq: Every day | ORAL | 2 refills | Status: DC

## 2019-09-08 NOTE — Telephone Encounter (Signed)
Patient states that he stop taking the Anti- seizure medication Divalproex due to the side effects

## 2019-09-08 NOTE — Telephone Encounter (Signed)
I spoke to patient and he stopped the Depakote last Saturday as was having diarrhea and headaches.  Informed him MD wants to start him on venlafaxine XR 37.5mg  daily and to call us back in 4 weeks and let us know how he is doing. At that time MD may increase dose if doing well. Patient verbalizes understanding. Pharmacy verified and script sent in.

## 2019-09-08 NOTE — Telephone Encounter (Signed)
Instead, we can start venlafaxine XR 37.5mg  daily (morning with breakfast).  We can increase dose in 4 weeks if needed.

## 2019-09-29 ENCOUNTER — Telehealth: Payer: Self-pay | Admitting: Cardiology

## 2019-09-29 ENCOUNTER — Telehealth: Payer: Self-pay | Admitting: Neurology

## 2019-09-29 NOTE — Telephone Encounter (Signed)
Pt states he had a back injury during the time he wore the heart monitor and doesn't feel as though the results are an accurate representation of what it going on with his heart rate.  He reports feeling weak and tired for several months now and believes that is r/t his heart rate.  He feels his heart rate is not increasing as it should to sustain him during exercise.  I states " it is constraining my way of life."  Advised I will review this information with Dr [name] to determine what, if any new orders are appropriate.  Pt is aware I will c/b once this has occurs.  He is going swimming today but states it will be difficult for him.

## 2019-09-29 NOTE — Telephone Encounter (Signed)
Outpatient Medication Detail   Disp Refills Start End   gabapentin (NEURONTIN) 300 MG capsule 180 capsule 5 09/04/2019    Sig: Take 2 capsules in AM, 1 capsule at noon, 1 capsule in PM, 2 capsules at bedtime   Sent to pharmacy as: gabapentin (NEURONTIN) 300 MG capsule   E-Prescribing Status: Receipt confirmed by pharmacy (09/04/2019 8:40 AM EDT)

## 2019-09-29 NOTE — Telephone Encounter (Signed)
Results from [place] patch worn in September: Study Highlights  1. NSR with sinus bradycardia and sinus tachycardia 2. NSVT 3. NS atrial tachycardia 4. No prolonged pauses 5. PVc's are present.  Gregg Taylor,M.D.   Pt has a long history of bradycardia, At Fib, HTN

## 2019-09-29 NOTE — Telephone Encounter (Signed)
STAT if HR is under 50 or over 120 (normal HR is 60-100 beats per minute)  1) What is your heart rate?  Resting heart rate is 45 to 49, with exercise it is come up in  the low 50's  2) Do you have a log of your heart rate readings (document readings)? no  3) Do you have any other symptoms?  When he walks he becomes so weak.- Pt says his primary doctor said she did not feel like his heart rate was coming up to where it need to be- pt feels he need a treadmill.  He said he wore a monitor for 3 days, but could not get a good reading, because he was unable to move around, because of his back.

## 2019-09-29 NOTE — Telephone Encounter (Signed)
I would like Dr. [name] to see him to discuss. Thanks Candee Furbish, MD

## 2019-09-29 NOTE — Telephone Encounter (Signed)
appt scheduled for 11/10 with Dr [name].

## 2019-09-29 NOTE — Telephone Encounter (Signed)
Left message for pt Dr [name] would like for him to be evaluated by Dr [name].  Advised I will be sending a message to Dr Tanna Furry scheduler to move his appt up and that he soul be hearing from her soon.  Advised to c/b if any questions or concerns.

## 2019-09-29 NOTE — Telephone Encounter (Signed)
Patient states that he is taking Gabapentin 1800 mg 3 pills a day. He states that we called in the 1200 mg amount of pills and we need to send a correct RX into the [facility] on [address]

## 2019-09-30 NOTE — Telephone Encounter (Signed)
Per last office note from 09/04/19 this dose was sent to pharmacy Current anticonvulsant: Gabapentin 600mg  in AM/300mg  noon/300mg  in PM/600mg  bedtime  That is a total on 1800 mg correct dose was sent to pharmacy will call patient to make him aware of this.

## 2019-09-30 NOTE — Telephone Encounter (Signed)
Called spoke with patient he was made aware of this. He states that pharmacy filled old Rx for 1 capsules 4 times a day. Pt will contact pharmacy to have correct Rx filled.

## 2019-10-01 ENCOUNTER — Other Ambulatory Visit: Payer: Self-pay | Admitting: Neurology

## 2019-10-01 DIAGNOSIS — G8929 Other chronic pain: Secondary | ICD-10-CM

## 2019-10-01 DIAGNOSIS — G44219 Episodic tension-type headache, not intractable: Secondary | ICD-10-CM

## 2019-10-01 DIAGNOSIS — G44229 Chronic tension-type headache, not intractable: Secondary | ICD-10-CM

## 2019-10-01 NOTE — Telephone Encounter (Signed)
Requested Prescriptions   Pending Prescriptions Disp Refills  . venlafaxine XR (EFFEXOR-XR) 37.5 MG 24 hr capsule [Pharmacy Med Name: VENLAFAXINE HCL ER 37.5 MG CAP] 90 capsule 1    Sig: TAKE 1 CAPSULE BY MOUTH DAILY WITH BREAKFAST.   Rx last filled:09/08/19 #30 2 refills  Pt last seen:09/04/19  Follow up appt scheduled: 01/08/2020

## 2019-10-14 ENCOUNTER — Other Ambulatory Visit: Payer: Self-pay

## 2019-10-14 ENCOUNTER — Encounter: Payer: Self-pay | Admitting: Internal Medicine

## 2019-10-14 ENCOUNTER — Ambulatory Visit: Payer: Medicare Other | Admitting: Internal Medicine

## 2019-10-14 DIAGNOSIS — I493 Ventricular premature depolarization: Secondary | ICD-10-CM | POA: Diagnosis not present

## 2019-10-14 DIAGNOSIS — I495 Sick sinus syndrome: Secondary | ICD-10-CM | POA: Diagnosis not present

## 2019-10-14 DIAGNOSIS — I480 Paroxysmal atrial fibrillation: Secondary | ICD-10-CM

## 2019-10-14 NOTE — Patient Instructions (Addendum)
Medication Instructions:  Your physician recommends that you continue on your current medications as directed. Please refer to the Current Medication list given to you today.  Labwork: None ordered.  Testing/Procedures: Your physician has requested that you have an exercise tolerance test. For further information please visit [email or web address]. Please also follow instruction sheet, as given.  You will be scheduled for a treadmill stress test at [place].   Follow-Up: Your physician wants you to follow-up in: based on results of your stress test  Any Other Special Instructions Will Be Listed Below (If Applicable).  If you need a refill on your cardiac medications before your next appointment, please call your pharmacy.   DO NOT TAKE YOUR CARVEDILOL FOR 24 HOURS PRIOR TO TEST

## 2019-10-14 NOTE — Progress Notes (Signed)
HPI Brian Hoover returns today for ongoing evaluation and management of PAF, sinus node dysfunction, PVC's and preoperative eval for back pain. He has mild LV dysfunction but minimal CHF symptoms. He has not had recurrent syncope. He remains active exercising regularly but feels like he is not able to increase his activity and thinks his heart is "not keeping up." He has had minimal palpitations of dofetilide. Allergies  Allergen Reactions  . Sulfa Antibiotics Hives          Current Outpatient Medications  Medication Sig Dispense Refill  . aspirin 325 MG tablet Take 975 mg by mouth 2 (two) times daily as needed (bad headaches.). For headaches     . butalbital-acetaminophen-caffeine (FIORICET, ESGIC) 50-325-40 MG tablet Take 1 tablet by mouth every 6 (six) hours as needed for headache. 10 tablet 3  . carvedilol (COREG) 3.125 MG tablet TAKE 1 TABLET BY MOUTH TWICE A DAY WITH A MEAL 180 tablet 3  . dofetilide (TIKOSYN) 250 MCG capsule TAKE 1 CAPSULE (250 MCG TOTAL) BY MOUTH 2 (TWO) TIMES DAILY. 180 capsule 3  . gabapentin (NEURONTIN) 300 MG capsule Take 2 capsules in AM, 1 capsule at noon, 1 capsule in PM, 2 capsules at bedtime 180 capsule 5  . lisinopril (ZESTRIL) 20 MG tablet Take 1 tablet (20 mg total) by mouth daily. 90 tablet 3  . Morphine Sulfate ER 15 MG TBEA Take 1 tablet by mouth as needed (pain).    . Probiotic Product (ALIGN PO) Use as directed     No current facility-administered medications for this visit.      Past Medical History:  Diagnosis Date  . Atrial fibrillation ([place])   . Back pain   . Bradycardia   . CHF (congestive heart failure) ([place])   . Chronic systolic heart failure ([place])   . COPD (chronic obstructive pulmonary disease) ([place])   . GERD (gastroesophageal reflux disease)   . Hypertension   . MGUS (monoclonal gammopathy of unknown significance) 01/25/2015  . Migraine   . Pneumothorax 01/28/2013  . PVC (premature ventricular contraction)   . Syncope    s/p Medtronic ILR implant 05/2013 by Dr [name]  . Trifascicular block     ROS:   All systems reviewed and negative except as noted in the HPI.   Past Surgical History:  Procedure Laterality Date  . BIOPSY  01/31/2019   Procedure: BIOPSY;  Surgeon: [name], MD;  Location: [facility];  Service: Endoscopy;;  . CHEST TUBE INSERTION Right 01/29/2013  . CLAVICLE SURGERY    . COLONOSCOPY WITH PROPOFOL N/A 01/31/2019   Procedure: COLONOSCOPY WITH PROPOFOL;  Surgeon: [name], MD;  Location: [facility];  Service: Endoscopy;  Laterality: N/A;  . LOOP RECORDER IMPLANT  06/02/2013   Medtronic LinQ implanted for syncope by Dr [name]  . LOOP RECORDER IMPLANT N/A 06/02/2013   Procedure: LOOP RECORDER IMPLANT;  Surgeon: [name], MD;  Location: [facility] CATH LAB;  Service: Cardiovascular;  Laterality: N/A;  . PROSTATE SURGERY    . SHOULDER ARTHROSCOPY    . TENDON REPAIR     right foot     Family History  Problem Relation Age of Onset  . Breast cancer Mother   . Uterine cancer Mother   . Rheum arthritis Son      Social History   Socioeconomic History  . Marital status: Married    Spouse name: [name]  . Number of children: 2  . Years of education: MA  .  Highest education level: Not on file  Occupational History  . Occupation: retired Glass blower/designer  . Financial resource strain: Not on file  . Food insecurity    Worry: Not on file    Inability: Not on file  . Transportation needs    Medical: Not on file    Non-medical: Not on file  Tobacco Use  . Smoking status: Former Smoker    Packs/day: 1.50    Years: 10.00    Pack years: 15.00    Types: Cigarettes    Quit date: 12/04/1960    Years since quitting: 58.8  . Smokeless tobacco: Never Used  Substance and Sexual Activity  . Alcohol use: Yes    Alcohol/week: 10.0 standard drinks    Types: 10 Shots of liquor per week    Comment: 4 Drinks a week  . Drug use: No  . Sexual activity: Yes    Partners: Female     Birth control/protection: None  Lifestyle  . Physical activity    Days per week: Not on file    Minutes per session: Not on file  . Stress: Not on file  Relationships  . Social Herbalist on phone: Not on file    Gets together: Not on file    Attends religious service: Not on file    Active member of club or organization: Not on file    Attends meetings of clubs or organizations: Not on file    Relationship status: Not on file  . Intimate partner violence    Fear of current or ex partner: Not on file    Emotionally abused: Not on file    Physically abused: Not on file    Forced sexual activity: Not on file  Other Topics Concern  . Not on file  Social History Narrative      Pt lives at home with his spouse.   Caffeine Use: 2 cups daily.     BP 140/68   Pulse (!) 45   Ht 5\' 10"  (1.778 m)   Wt 161 lb (73 kg)   SpO2 97%   BMI 23.10 kg/m   Physical Exam:  Well appearing NAD HEENT: Unremarkable Neck:  No JVD, no thyromegally Lymphatics:  No adenopathy Back:  No CVA tenderness Lungs:  Clear with no wheezes HEART:  Regular rate rhythm, no murmurs, no rubs, no clicks Abd:  soft, positive bowel sounds, no organomegally, no rebound, no guarding Ext:  2 plus pulses, no edema, no cyanosis, no clubbing Skin:  No rashes no nodules Neuro:  CN II through XII intact, motor grossly intact  EKG - sinus bradycardia   Assess/Plan: 1. Sinus node dysfunction - he clearly has this. The question is whether or not he is symptomatic. I have recommended he undergo exercise testing with a modified bruce protocol. His heart monitor showed an ave HR of 55/min. 2. PVC's - his heart monitor showed infrequent episodes.  3. PAF - he has been asymptomatic on the dofetilide. Continue.  Mikle Bosworth.D.

## 2019-10-16 ENCOUNTER — Ambulatory Visit (HOSPITAL_COMMUNITY)
Admission: RE | Admit: 2019-10-16 | Discharge: 2019-10-16 | Disposition: A | Discharge status: Home / Self Care | Payer: Medicare Other | Source: Ambulatory Visit | Attending: Nurse Practitioner | Admitting: Nurse Practitioner

## 2019-10-16 ENCOUNTER — Other Ambulatory Visit: Payer: Self-pay

## 2019-10-16 ENCOUNTER — Telehealth: Payer: Self-pay | Admitting: Internal Medicine

## 2019-10-16 ENCOUNTER — Encounter (HOSPITAL_COMMUNITY): Payer: Self-pay | Admitting: Nurse Practitioner

## 2019-10-16 VITALS — BP 144/78 | HR 46 | Ht 70.0 in | Wt 161.8 lb

## 2019-10-16 DIAGNOSIS — Z87891 Personal history of nicotine dependence: Secondary | ICD-10-CM | POA: Diagnosis not present

## 2019-10-16 DIAGNOSIS — I110 Hypertensive heart disease with heart failure: Secondary | ICD-10-CM | POA: Insufficient documentation

## 2019-10-16 DIAGNOSIS — Z79899 Other long term (current) drug therapy: Secondary | ICD-10-CM | POA: Diagnosis not present

## 2019-10-16 DIAGNOSIS — I454 Nonspecific intraventricular block: Secondary | ICD-10-CM | POA: Insufficient documentation

## 2019-10-16 DIAGNOSIS — J449 Chronic obstructive pulmonary disease, unspecified: Secondary | ICD-10-CM | POA: Insufficient documentation

## 2019-10-16 DIAGNOSIS — I4891 Unspecified atrial fibrillation: Secondary | ICD-10-CM | POA: Insufficient documentation

## 2019-10-16 DIAGNOSIS — I480 Paroxysmal atrial fibrillation: Secondary | ICD-10-CM | POA: Diagnosis not present

## 2019-10-16 DIAGNOSIS — I5022 Chronic systolic (congestive) heart failure: Secondary | ICD-10-CM | POA: Diagnosis not present

## 2019-10-16 DIAGNOSIS — Z7982 Long term (current) use of aspirin: Secondary | ICD-10-CM | POA: Insufficient documentation

## 2019-10-16 NOTE — Progress Notes (Signed)
Primary Care Physician: [name], MD Referring Physician: [facility] triage EP: Brian Hoover is a 85 y.o. male with a h/o afib on tikosyn, in the afib clinic for doubling up on his tikosyn this am by accident. He usually takes one at 8am and this am took one at 7 and 8 am. He is now in the clinic for EKG almost 8 hours from first tikosyn dose . He feels well. His ekg is in S brady at 46 bpm,  with qtc stable at 448 ms. Unchanged from previous EKG's. He is on ASA only as he could not take anticoagulation without  bleeding.  Today, he denies symptoms of palpitations, chest pain, shortness of breath, orthopnea, PND, lower extremity edema, dizziness, presyncope, syncope, or neurologic sequela. The patient is tolerating medications without difficulties and is otherwise without complaint today.   Past Medical History:  Diagnosis Date  . Atrial fibrillation ([place])   . Back pain   . Bradycardia   . CHF (congestive heart failure) ([place])   . Chronic systolic heart failure ([place])   . COPD (chronic obstructive pulmonary disease) ([place])   . GERD (gastroesophageal reflux disease)   . Hypertension   . MGUS (monoclonal gammopathy of unknown significance) 01/25/2015  . Migraine   . Pneumothorax 01/28/2013  . PVC (premature ventricular contraction)   . Syncope    s/p Medtronic ILR implant 05/2013 by Dr [name]  . Trifascicular block    Past Surgical History:  Procedure Laterality Date  . BIOPSY  01/31/2019   Procedure: BIOPSY;  Surgeon: [name], MD;  Location: [facility];  Service: Endoscopy;;  . CHEST TUBE INSERTION Right 01/29/2013  . CLAVICLE SURGERY    . COLONOSCOPY WITH PROPOFOL N/A 01/31/2019   Procedure: COLONOSCOPY WITH PROPOFOL;  Surgeon: [name], MD;  Location: [facility];  Service: Endoscopy;  Laterality: N/A;  . LOOP RECORDER IMPLANT  06/02/2013   Medtronic LinQ implanted for syncope by Dr [name]  . LOOP RECORDER IMPLANT N/A 06/02/2013   Procedure: LOOP  RECORDER IMPLANT;  Surgeon: [name], MD;  Location: [facility] CATH LAB;  Service: Cardiovascular;  Laterality: N/A;  . PROSTATE SURGERY    . SHOULDER ARTHROSCOPY    . TENDON REPAIR     right foot    Current Outpatient Medications  Medication Sig Dispense Refill  . aspirin 325 MG tablet Take 975 mg by mouth 2 (two) times daily as needed (bad headaches.). For headaches     . butalbital-acetaminophen-caffeine (FIORICET, ESGIC) 50-325-40 MG tablet Take 1 tablet by mouth every 6 (six) hours as needed for headache. 10 tablet 3  . carvedilol (COREG) 3.125 MG tablet TAKE 1 TABLET BY MOUTH TWICE A DAY WITH A MEAL 180 tablet 3  . dofetilide (TIKOSYN) 250 MCG capsule TAKE 1 CAPSULE (250 MCG TOTAL) BY MOUTH 2 (TWO) TIMES DAILY. 180 capsule 3  . gabapentin (NEURONTIN) 300 MG capsule Take 2 capsules in AM, 1 capsule at noon, 1 capsule in PM, 2 capsules at bedtime 180 capsule 5  . lisinopril (ZESTRIL) 20 MG tablet Take 1 tablet (20 mg total) by mouth daily. 90 tablet 3  . Morphine Sulfate ER 15 MG TBEA Take 1 tablet by mouth as needed (pain).    . Probiotic Product (ALIGN PO) Use as directed     No current facility-administered medications for this encounter.     Allergies  Allergen Reactions  . Sulfa Antibiotics Hives  Social History   Socioeconomic History  . Marital status: Married    Spouse name: [name]  . Number of children: 2  . Years of education: MA  . Highest education level: Not on file  Occupational History  . Occupation: retired Glass blower/designer  . Financial resource strain: Not on file  . Food insecurity    Worry: Not on file    Inability: Not on file  . Transportation needs    Medical: Not on file    Non-medical: Not on file  Tobacco Use  . Smoking status: Former Smoker    Packs/day: 1.50    Years: 10.00    Pack years: 15.00    Types: Cigarettes    Quit date: 12/04/1960    Years since quitting: 58.9  . Smokeless tobacco: Never Used  Substance and Sexual  Activity  . Alcohol use: Yes    Alcohol/week: 2.0 standard drinks    Types: 2 Shots of liquor per week    Comment: 2 shots of vodka nightly  . Drug use: No  . Sexual activity: Yes    Partners: Female    Birth control/protection: None  Lifestyle  . Physical activity    Days per week: Not on file    Minutes per session: Not on file  . Stress: Not on file  Relationships  . Social Herbalist on phone: Not on file    Gets together: Not on file    Attends religious service: Not on file    Active member of club or organization: Not on file    Attends meetings of clubs or organizations: Not on file    Relationship status: Not on file  . Intimate partner violence    Fear of current or ex partner: Not on file    Emotionally abused: Not on file    Physically abused: Not on file    Forced sexual activity: Not on file  Other Topics Concern  . Not on file  Social History Narrative      Pt lives at home with his spouse.   Caffeine Use: 2 cups daily.    Family History  Problem Relation Age of Onset  . Breast cancer Mother   . Uterine cancer Mother   . Rheum arthritis Son     ROS- All systems are reviewed and negative except as per the HPI above  Physical Exam: Vitals:   10/16/19 1506  BP: (!) 144/78  Pulse: (!) 46  Weight: 73.4 kg  Height: 5\' 10"  (1.778 m)   Wt Readings from Last 3 Encounters:  10/16/19 73.4 kg  10/14/19 73 kg  09/03/19 72.6 kg    Labs: Lab Results  Component Value Date   NA 135 04/17/2018   K 4.7 04/17/2018   CL 98 (L) 04/17/2018   CO2 31 04/17/2018   GLUCOSE 97 04/17/2018   BUN 15 04/17/2018   CREATININE 1.00 04/17/2018   CALCIUM 9.9 04/17/2018   MG 2.0 04/17/2018   Lab Results  Component Value Date   INR 0.92 01/29/2013   Lab Results  Component Value Date   CHOL 172 09/16/2017   HDL 64 09/16/2017   [place] 93 09/16/2017   TRIG 75 09/16/2017     GEN- The patient is well appearing, alert and oriented x 3 today.   Head-  normocephalic, atraumatic Eyes-  Sclera clear, conjunctiva pink Ears- hearing intact Oropharynx- clear Neck- supple, no JVP Lymph- no cervical lymphadenopathy Lungs- Clear to ausculation  bilaterally, normal work of breathing Heart- Regular rate and rhythm, no murmurs, rubs or gallops, PMI not laterally displaced GI- soft, NT, ND, + BS Extremities- no clubbing, cyanosis, or edema MS- no significant deformity or atrophy Skin- no rash or lesion Psych- euthymic mood, full affect Neuro- strength and sensation are intact  EKG- sinus brady at 46 bpm, qrs int 142 ms, qtc 448 ms( stable) Unchanged when compared to previous ekg's     Assessment and Plan: 1. Afib On tikosyn 250 mcg bid This am took 250 mcg at 7a and 8a by accident ekg shows stable qt (now  8 hours past first dose) I spoke with Meghan Supple and she feels he will be ok to take dose this pm but instead of at 8 pm, push it to 11 pm He will resume normal dosing in the am Continue asa, he cannot take anticoagulation for h/o GI bleeding  F/u with Dr. [name] as scheduled   Geroge Baseman. Ludie Hudon, [facility] [address] [place], [place] [address] [phone]

## 2019-10-16 NOTE — Telephone Encounter (Signed)
Pt c/o medication issue:  1. Name of Medication: dofetilide (TIKOSYN) 250 MCG capsule  2. How are you currently taking this medication (dosage and times per day)? As directed  3. Are you having a reaction (difficulty breathing--STAT)? no  4. What is your medication issue? Patient is to take one tablet in the AM and one tablet in the PM, patient accidentally took 2 tablets this morning. Patient's wife, [name], would like to know if he needs to take tablet tonight as he would normally. Please advise.

## 2019-10-16 NOTE — Telephone Encounter (Signed)
I called back and spoke to [place] and per Chanetta Marshall NP pt will need an EKG today.. pt to see Roderic Palau NP at the Afib clinic at 3 pm today.

## 2019-10-29 ENCOUNTER — Telehealth (HOSPITAL_COMMUNITY): Payer: Self-pay

## 2019-10-29 NOTE — Telephone Encounter (Signed)
Encounter complete. 

## 2019-10-31 ENCOUNTER — Other Ambulatory Visit: Payer: Self-pay | Admitting: Cardiology

## 2019-11-01 ENCOUNTER — Other Ambulatory Visit (HOSPITAL_COMMUNITY)
Admission: RE | Admit: 2019-11-01 | Discharge: 2019-11-01 | Disposition: A | Discharge status: Home / Self Care | Payer: Medicare Other | Source: Ambulatory Visit | Attending: Internal Medicine | Admitting: Internal Medicine

## 2019-11-01 DIAGNOSIS — Z01812 Encounter for preprocedural laboratory examination: Secondary | ICD-10-CM | POA: Insufficient documentation

## 2019-11-01 DIAGNOSIS — Z20828 Contact with and (suspected) exposure to other viral communicable diseases: Secondary | ICD-10-CM | POA: Insufficient documentation

## 2019-11-02 LAB — NOVEL CORONAVIRUS, NAA (HOSP ORDER, SEND-OUT TO REF LAB; TAT 18-24 HRS): SARS-CoV-2, NAA: NOT DETECTED

## 2019-11-04 ENCOUNTER — Other Ambulatory Visit: Payer: Self-pay | Admitting: Cardiology

## 2019-11-04 MED ORDER — CARVEDILOL 3.125 MG PO TABS: ORAL_TABLET | ORAL | 2 refills | Status: AC

## 2019-11-04 NOTE — Telephone Encounter (Signed)
Pt's medication was sent to pt's pharmacy as requested. Confirmation received. I also called pt and left a message informing pt that this matter was taken care of as requested.

## 2019-11-05 ENCOUNTER — Ambulatory Visit (HOSPITAL_COMMUNITY)
Admission: RE | Admit: 2019-11-05 | Discharge: 2019-11-05 | Disposition: A | Discharge status: Home / Self Care | Payer: Medicare Other | Source: Ambulatory Visit | Attending: Cardiovascular Disease | Admitting: Cardiovascular Disease

## 2019-11-05 ENCOUNTER — Other Ambulatory Visit: Payer: Self-pay

## 2019-11-05 DIAGNOSIS — I495 Sick sinus syndrome: Secondary | ICD-10-CM | POA: Diagnosis present

## 2019-11-05 LAB — EXERCISE TOLERANCE TEST
Estimated workload: 4.6 METS
Exercise duration (min): 8 min
Exercise duration (sec): 27 s
MPHR: 135 {beats}/min
Peak HR: 196 {beats}/min
Percent HR: 145 %
RPE: 18
Rest HR: 61 {beats}/min

## 2019-11-14 ENCOUNTER — Ambulatory Visit: Payer: Medicare Other | Admitting: Internal Medicine

## 2019-11-22 ENCOUNTER — Other Ambulatory Visit: Payer: Self-pay | Admitting: Neurology

## 2019-12-16 ENCOUNTER — Ambulatory Visit: Payer: Medicare Other | Admitting: Internal Medicine

## 2019-12-24 ENCOUNTER — Telehealth: Payer: Medicare Other | Admitting: Neurology

## 2020-01-05 DEATH — deceased

## 2020-01-08 ENCOUNTER — Ambulatory Visit: Payer: Medicare Other | Admitting: Internal Medicine

## 2020-01-08 ENCOUNTER — Ambulatory Visit: Payer: Medicare Other | Admitting: Neurology
